# Patient Record
Sex: Female | Born: 1975 | Race: Black or African American | Hispanic: No | Marital: Single | Smoking: Former smoker
Health system: Southern US, Community
[De-identification: ages and names within clinical notes are randomized; demographics above are authoritative.]

## PROBLEM LIST (undated history)

## (undated) ENCOUNTER — Ambulatory Visit: Admission: EM | Payer: Medicaid Other

## (undated) DIAGNOSIS — N63 Unspecified lump in unspecified breast: Secondary | ICD-10-CM

## (undated) DIAGNOSIS — B001 Herpesviral vesicular dermatitis: Secondary | ICD-10-CM

## (undated) DIAGNOSIS — K219 Gastro-esophageal reflux disease without esophagitis: Secondary | ICD-10-CM

## (undated) DIAGNOSIS — I480 Paroxysmal atrial fibrillation: Secondary | ICD-10-CM

## (undated) DIAGNOSIS — I499 Cardiac arrhythmia, unspecified: Secondary | ICD-10-CM

## (undated) DIAGNOSIS — R002 Palpitations: Secondary | ICD-10-CM

## (undated) DIAGNOSIS — M5136 Other intervertebral disc degeneration, lumbar region: Secondary | ICD-10-CM

## (undated) DIAGNOSIS — M51369 Other intervertebral disc degeneration, lumbar region without mention of lumbar back pain or lower extremity pain: Secondary | ICD-10-CM

## (undated) DIAGNOSIS — F419 Anxiety disorder, unspecified: Secondary | ICD-10-CM

## (undated) HISTORY — DX: Gastro-esophageal reflux disease without esophagitis: K21.9

## (undated) HISTORY — DX: Other intervertebral disc degeneration, lumbar region without mention of lumbar back pain or lower extremity pain: M51.369

## (undated) HISTORY — DX: Herpesviral vesicular dermatitis: B00.1

## (undated) HISTORY — DX: Paroxysmal atrial fibrillation: I48.0

## (undated) HISTORY — PX: TUBAL LIGATION: SHX77

## (undated) HISTORY — DX: Unspecified lump in unspecified breast: N63.0

## (undated) HISTORY — DX: Other intervertebral disc degeneration, lumbar region: M51.36

## (undated) HISTORY — DX: Palpitations: R00.2

---

## 2000-01-02 ENCOUNTER — Emergency Department (HOSPITAL_COMMUNITY): Admission: EM | Admit: 2000-01-02 | Discharge: 2000-01-02 | Payer: Self-pay | Admitting: Emergency Medicine

## 2000-02-29 ENCOUNTER — Other Ambulatory Visit: Admission: RE | Admit: 2000-02-29 | Discharge: 2000-02-29 | Payer: Self-pay | Admitting: Obstetrics

## 2000-08-06 ENCOUNTER — Inpatient Hospital Stay (HOSPITAL_COMMUNITY): Admission: AD | Admit: 2000-08-06 | Discharge: 2000-08-06 | Payer: Self-pay | Admitting: Obstetrics

## 2000-08-08 ENCOUNTER — Inpatient Hospital Stay (HOSPITAL_COMMUNITY): Admission: AD | Admit: 2000-08-08 | Discharge: 2000-08-10 | Payer: Self-pay

## 2001-10-10 ENCOUNTER — Emergency Department (HOSPITAL_COMMUNITY): Admission: EM | Admit: 2001-10-10 | Discharge: 2001-10-10 | Payer: Self-pay | Admitting: Emergency Medicine

## 2002-07-24 ENCOUNTER — Emergency Department (HOSPITAL_COMMUNITY): Admission: RE | Admit: 2002-07-24 | Discharge: 2002-07-25 | Payer: Self-pay | Admitting: *Deleted

## 2002-07-24 ENCOUNTER — Encounter: Payer: Self-pay | Admitting: *Deleted

## 2002-12-28 ENCOUNTER — Inpatient Hospital Stay (HOSPITAL_COMMUNITY): Admission: AD | Admit: 2002-12-28 | Discharge: 2002-12-28 | Payer: Self-pay | Admitting: Obstetrics

## 2005-09-20 ENCOUNTER — Ambulatory Visit: Payer: Self-pay | Admitting: Family Medicine

## 2005-10-25 ENCOUNTER — Ambulatory Visit: Payer: Self-pay | Admitting: Family Medicine

## 2005-10-30 ENCOUNTER — Ambulatory Visit: Payer: Self-pay | Admitting: *Deleted

## 2005-12-15 ENCOUNTER — Ambulatory Visit: Payer: Self-pay | Admitting: Family Medicine

## 2006-04-16 ENCOUNTER — Ambulatory Visit (HOSPITAL_COMMUNITY): Admission: RE | Admit: 2006-04-16 | Discharge: 2006-04-16 | Payer: Self-pay | Admitting: Family Medicine

## 2006-04-16 ENCOUNTER — Ambulatory Visit: Payer: Self-pay | Admitting: Family Medicine

## 2006-09-14 ENCOUNTER — Ambulatory Visit: Payer: Self-pay | Admitting: Family Medicine

## 2007-03-05 ENCOUNTER — Inpatient Hospital Stay (HOSPITAL_COMMUNITY): Admission: AD | Admit: 2007-03-05 | Discharge: 2007-03-05 | Payer: Self-pay | Admitting: Obstetrics & Gynecology

## 2007-03-28 ENCOUNTER — Ambulatory Visit (HOSPITAL_COMMUNITY): Admission: RE | Admit: 2007-03-28 | Discharge: 2007-03-28 | Payer: Self-pay | Admitting: Obstetrics

## 2007-04-25 ENCOUNTER — Ambulatory Visit (HOSPITAL_COMMUNITY): Admission: RE | Admit: 2007-04-25 | Discharge: 2007-04-25 | Payer: Self-pay | Admitting: Obstetrics

## 2007-05-24 ENCOUNTER — Ambulatory Visit (HOSPITAL_COMMUNITY): Admission: RE | Admit: 2007-05-24 | Discharge: 2007-05-24 | Payer: Self-pay | Admitting: Obstetrics

## 2007-06-21 ENCOUNTER — Ambulatory Visit (HOSPITAL_COMMUNITY): Admission: RE | Admit: 2007-06-21 | Discharge: 2007-06-21 | Payer: Self-pay | Admitting: Obstetrics

## 2007-07-12 ENCOUNTER — Ambulatory Visit (HOSPITAL_COMMUNITY): Admission: RE | Admit: 2007-07-12 | Discharge: 2007-07-12 | Payer: Self-pay | Admitting: Obstetrics

## 2007-07-16 ENCOUNTER — Ambulatory Visit: Payer: Self-pay | Admitting: Obstetrics & Gynecology

## 2007-07-19 ENCOUNTER — Ambulatory Visit: Payer: Self-pay | Admitting: Obstetrics & Gynecology

## 2007-07-22 ENCOUNTER — Ambulatory Visit (HOSPITAL_COMMUNITY): Admission: RE | Admit: 2007-07-22 | Discharge: 2007-07-22 | Payer: Self-pay | Admitting: Obstetrics

## 2007-07-29 ENCOUNTER — Ambulatory Visit (HOSPITAL_COMMUNITY): Admission: RE | Admit: 2007-07-29 | Discharge: 2007-07-29 | Payer: Self-pay | Admitting: Obstetrics

## 2007-08-01 ENCOUNTER — Ambulatory Visit (HOSPITAL_COMMUNITY): Admission: RE | Admit: 2007-08-01 | Discharge: 2007-08-01 | Payer: Self-pay | Admitting: Obstetrics

## 2007-08-05 ENCOUNTER — Ambulatory Visit (HOSPITAL_COMMUNITY): Admission: RE | Admit: 2007-08-05 | Discharge: 2007-08-05 | Payer: Self-pay | Admitting: Obstetrics

## 2007-08-08 ENCOUNTER — Ambulatory Visit (HOSPITAL_COMMUNITY): Admission: RE | Admit: 2007-08-08 | Discharge: 2007-08-08 | Payer: Self-pay | Admitting: Obstetrics

## 2007-08-15 ENCOUNTER — Encounter (INDEPENDENT_AMBULATORY_CARE_PROVIDER_SITE_OTHER): Payer: Self-pay | Admitting: Obstetrics

## 2007-08-15 ENCOUNTER — Inpatient Hospital Stay (HOSPITAL_COMMUNITY): Admission: RE | Admit: 2007-08-15 | Discharge: 2007-08-18 | Payer: Self-pay | Admitting: Obstetrics

## 2007-08-28 ENCOUNTER — Encounter (INDEPENDENT_AMBULATORY_CARE_PROVIDER_SITE_OTHER): Payer: Self-pay | Admitting: *Deleted

## 2008-02-03 ENCOUNTER — Emergency Department (HOSPITAL_COMMUNITY): Admission: EM | Admit: 2008-02-03 | Discharge: 2008-02-03 | Payer: Self-pay | Admitting: Family Medicine

## 2008-02-16 ENCOUNTER — Emergency Department (HOSPITAL_COMMUNITY): Admission: EM | Admit: 2008-02-16 | Discharge: 2008-02-16 | Payer: Self-pay | Admitting: Family Medicine

## 2008-05-02 ENCOUNTER — Emergency Department (HOSPITAL_COMMUNITY): Admission: EM | Admit: 2008-05-02 | Discharge: 2008-05-02 | Payer: Self-pay | Admitting: Family Medicine

## 2008-05-04 ENCOUNTER — Emergency Department (HOSPITAL_COMMUNITY): Admission: EM | Admit: 2008-05-04 | Discharge: 2008-05-04 | Payer: Self-pay | Admitting: Emergency Medicine

## 2008-05-07 ENCOUNTER — Emergency Department (HOSPITAL_COMMUNITY): Admission: EM | Admit: 2008-05-07 | Discharge: 2008-05-07 | Payer: Self-pay | Admitting: Emergency Medicine

## 2009-01-07 ENCOUNTER — Encounter: Payer: Self-pay | Admitting: Internal Medicine

## 2009-01-07 ENCOUNTER — Ambulatory Visit: Payer: Self-pay | Admitting: Internal Medicine

## 2009-01-07 LAB — CONVERTED CEMR LAB
Albumin: 4.7 g/dL (ref 3.5–5.2)
BUN: 16 mg/dL (ref 6–23)
CO2: 18 meq/L — ABNORMAL LOW (ref 19–32)
Eosinophils Relative: 1 % (ref 0–5)
Glucose, Bld: 83 mg/dL (ref 70–99)
HCT: 38 % (ref 36.0–46.0)
Lymphocytes Relative: 30 % (ref 12–46)
Lymphs Abs: 3 10*3/uL (ref 0.7–4.0)
Monocytes Relative: 6 % (ref 3–12)
Platelets: 412 10*3/uL — ABNORMAL HIGH (ref 150–400)
Potassium: 4.5 meq/L (ref 3.5–5.3)
RBC: 4.28 M/uL (ref 3.87–5.11)
Sodium: 139 meq/L (ref 135–145)
Total Bilirubin: 0.3 mg/dL (ref 0.3–1.2)
Total Protein: 7.6 g/dL (ref 6.0–8.3)
WBC: 9.8 10*3/uL (ref 4.0–10.5)

## 2009-03-31 ENCOUNTER — Ambulatory Visit: Payer: Self-pay | Admitting: Internal Medicine

## 2009-04-02 ENCOUNTER — Ambulatory Visit: Payer: Self-pay | Admitting: Internal Medicine

## 2009-04-12 ENCOUNTER — Ambulatory Visit: Payer: Self-pay | Admitting: Family Medicine

## 2009-06-01 ENCOUNTER — Encounter (INDEPENDENT_AMBULATORY_CARE_PROVIDER_SITE_OTHER): Payer: Self-pay | Admitting: Family Medicine

## 2009-06-01 ENCOUNTER — Other Ambulatory Visit: Admission: RE | Admit: 2009-06-01 | Discharge: 2009-06-01 | Payer: Self-pay | Admitting: Family Medicine

## 2009-06-01 ENCOUNTER — Ambulatory Visit: Payer: Self-pay | Admitting: Family Medicine

## 2009-06-01 LAB — CONVERTED CEMR LAB
Chlamydia, DNA Probe: NEGATIVE
GC Probe Amp, Genital: NEGATIVE
LDL Cholesterol: 112 mg/dL — ABNORMAL HIGH (ref 0–99)

## 2009-08-19 ENCOUNTER — Ambulatory Visit: Payer: Self-pay | Admitting: Internal Medicine

## 2009-10-06 ENCOUNTER — Telehealth (INDEPENDENT_AMBULATORY_CARE_PROVIDER_SITE_OTHER): Payer: Self-pay | Admitting: *Deleted

## 2009-10-06 ENCOUNTER — Ambulatory Visit: Payer: Self-pay | Admitting: Family Medicine

## 2009-12-14 ENCOUNTER — Ambulatory Visit: Payer: Self-pay | Admitting: Family Medicine

## 2009-12-14 LAB — CONVERTED CEMR LAB
Albumin: 4.3 g/dL (ref 3.5–5.2)
Alkaline Phosphatase: 59 units/L (ref 39–117)
CO2: 24 meq/L (ref 19–32)
Chloride: 102 meq/L (ref 96–112)
Eosinophils Relative: 0 % (ref 0–5)
Glucose, Bld: 87 mg/dL (ref 70–99)
HCT: 37.3 % (ref 36.0–46.0)
Hemoglobin: 12.3 g/dL (ref 12.0–15.0)
Lymphocytes Relative: 15 % (ref 12–46)
Lymphs Abs: 1.1 10*3/uL (ref 0.7–4.0)
Monocytes Absolute: 0.6 10*3/uL (ref 0.1–1.0)
Monocytes Relative: 8 % (ref 3–12)
Potassium: 4.2 meq/L (ref 3.5–5.3)
Sodium: 139 meq/L (ref 135–145)
Total Protein: 7.2 g/dL (ref 6.0–8.3)
WBC: 7.4 10*3/uL (ref 4.0–10.5)

## 2010-03-14 ENCOUNTER — Ambulatory Visit: Payer: Self-pay | Admitting: Family Medicine

## 2010-06-15 ENCOUNTER — Ambulatory Visit: Payer: Self-pay | Admitting: Family Medicine

## 2010-08-09 ENCOUNTER — Ambulatory Visit: Payer: Self-pay | Admitting: Internal Medicine

## 2010-08-12 ENCOUNTER — Ambulatory Visit: Payer: Self-pay | Admitting: Family Medicine

## 2010-09-01 ENCOUNTER — Emergency Department (HOSPITAL_COMMUNITY): Admission: EM | Admit: 2010-09-01 | Discharge: 2010-09-01 | Payer: Self-pay | Admitting: Emergency Medicine

## 2010-09-19 ENCOUNTER — Emergency Department (HOSPITAL_COMMUNITY): Admission: EM | Admit: 2010-09-19 | Discharge: 2010-09-20 | Payer: Self-pay | Admitting: Emergency Medicine

## 2011-01-01 ENCOUNTER — Encounter: Payer: Self-pay | Admitting: Obstetrics

## 2011-02-23 LAB — URINALYSIS, ROUTINE W REFLEX MICROSCOPIC
Bilirubin Urine: NEGATIVE
Nitrite: NEGATIVE
Specific Gravity, Urine: 1.035 — ABNORMAL HIGH (ref 1.005–1.030)
Urobilinogen, UA: 0.2 mg/dL (ref 0.0–1.0)
pH: 6 (ref 5.0–8.0)

## 2011-02-23 LAB — POCT CARDIAC MARKERS
CKMB, poc: <1 ng/mL — ABNORMAL LOW (ref 1.0–8.0)
Myoglobin, poc: 63.1 ng/mL (ref 12–200)

## 2011-02-23 LAB — D-DIMER, QUANTITATIVE: D-Dimer, Quant: 1.27 ug/mL-FEU — ABNORMAL HIGH (ref 0.00–0.48)

## 2011-04-16 ENCOUNTER — Inpatient Hospital Stay (HOSPITAL_COMMUNITY): Payer: Self-pay

## 2011-04-16 ENCOUNTER — Inpatient Hospital Stay (HOSPITAL_COMMUNITY)
Admission: AD | Admit: 2011-04-16 | Discharge: 2011-04-16 | Disposition: A | Discharge status: Home / Self Care | Payer: Self-pay | Source: Ambulatory Visit | Attending: Obstetrics & Gynecology | Admitting: Obstetrics & Gynecology

## 2011-04-16 DIAGNOSIS — N921 Excessive and frequent menstruation with irregular cycle: Secondary | ICD-10-CM

## 2011-04-16 DIAGNOSIS — N6009 Solitary cyst of unspecified breast: Secondary | ICD-10-CM | POA: Insufficient documentation

## 2011-04-16 LAB — URINALYSIS, ROUTINE W REFLEX MICROSCOPIC
Bilirubin Urine: NEGATIVE
Glucose, UA: NEGATIVE mg/dL
Ketones, ur: NEGATIVE mg/dL
Nitrite: NEGATIVE
Specific Gravity, Urine: 1.025 (ref 1.005–1.030)
pH: 5.5 (ref 5.0–8.0)

## 2011-04-16 LAB — WET PREP, GENITAL
Trich, Wet Prep: NONE SEEN
Yeast Wet Prep HPF POC: NONE SEEN

## 2011-04-16 LAB — URINE MICROSCOPIC-ADD ON

## 2011-04-16 LAB — POCT PREGNANCY, URINE: Preg Test, Ur: NEGATIVE

## 2011-04-17 LAB — GC/CHLAMYDIA PROBE AMP, GENITAL: GC Probe Amp, Genital: NEGATIVE

## 2011-04-25 NOTE — Op Note (Signed)
Melanie Vincent, Melanie Vincent              ACCOUNT NO.:  [id number]   MEDICAL RECORD NO.:  [id number]          PATIENT TYPE:  INP   LOCATION:                                FACILITY:  [facility]   PHYSICIAN:  Kathreen Cosier, M.D.DATE OF BIRTH:  [birth year]   DATE OF PROCEDURE:  08/15/2007  DATE OF DISCHARGE:                               OPERATIVE REPORT   PREOPERATIVE DIAGNOSIS:  Intrauterine pregnancy twin gestation at term,  multiparity, desired C-section and tubal ligation.   SURGEON:  Kathreen Cosier, M.D.   FIRST ASSISTANT:  Charles A. Clearance Coots, M.D.   ANESTHESIA:  Spinal.   PROCEDURE:  The patient placed on the operating table in supine position  after spinal administered.  Abdomen prepped and draped, bladder emptied  with Foley catheter.  A transverse suprapubic incision made carried down  to the rectus fascia.  Fascia cleaned and incised length of incision.  Recti muscles retracted laterally.  Peritoneum incised longitudinally.  Transverse incision made in the visceral peritoneum above the bladder.  Bladder mobilized inferiorly.  Transverse lower uterine incision made.  Twin A was a vertex, fluid was clear and it was a female weighing 6  pounds 4 ounces, Apgar 8 and 8.  The team was in attendance.  Twin B  vertex female, 6 pounds 14 ounces, Apgar 9 and 9, two placentas removed  manually and sent to pathology.  Uterine cavity cleaned with dry laps.  Uterine incision closed in one layer with continuous suture of #1  chromic.  Bladder flap reattached with 2-0 chromic, left tube grasped in  midportion with Babcock clamp.  Zero plain suture placed in the  mesosalpinx below the portion of tube within clamp.  This was tied.  Approximately 1 inch of tube transected.  Hemostasis satisfactory.  Procedure done in similar fashion other side.  Lap and sponge counts  correct.  Abdomen closed in layers, peritoneum continuous suture of 0  chromic, fascia continuous suture of 0 Dexon, skin closed  with  subcuticular stitch of 4-0 Monocryl.  Blood loss 800 mL.  The patient  tolerated procedure well, taken to recovery room in good condition.           ______________________________  Kathreen Cosier, M.D.     BAM/MEDQ  D:  08/15/2007  T:  08/15/2007  Job:  [id number]

## 2011-04-28 NOTE — Discharge Summary (Signed)
Melanie Vincent, Melanie Vincent              ACCOUNT NO.:  [id number]   MEDICAL RECORD NO.:  [id number]          PATIENT TYPE:  INP   LOCATION:  9127                          [facility]ACILITY:  [facility]   PHYSICIAN:  Kathreen Cosier, M.D.DATE OF BIRTH:  1976-03-12   DATE OF ADMISSION:  08/15/2007  DATE OF DISCHARGE:  08/18/2007                               DISCHARGE SUMMARY   The patient is a 35 year old, gravida 4, para 3-0-0-3 whose EDC was  September 04, 2007.  She was pregnant with twins and desired C-section  and tubal ligation. She had a low transverse cesarean section. Twin A  was vertex, 6 pounds 4 ounces, Apgar 8 and 9, fluid clear.  Twin B  vertex, 6 pounds 14 ounces, Apgar 9 and 9, female. The placenta was sent  to pathology. She had a tubal ligation performed. On admission her  hemoglobin was 11.8, postop hematocrit 26, platelets 211 and 194.  She  did well postop and was discharged on the third postoperative day  ambulatory on a regular diet on Tylox for pain and ferrous sulfate for  anemia.   DISCHARGE DIAGNOSES:  1. Status post primary low transverse cesarean section.  2. Tubal ligation.  3. Twin gestation at term.           ______________________________  Kathreen Cosier, M.D.     BAM/MEDQ  D:  09/11/2007  T:  09/11/2007  Job:  [id number]

## 2011-05-04 ENCOUNTER — Ambulatory Visit (INDEPENDENT_AMBULATORY_CARE_PROVIDER_SITE_OTHER): Payer: Self-pay | Admitting: Family Medicine

## 2011-05-04 ENCOUNTER — Other Ambulatory Visit: Payer: Self-pay | Admitting: Family Medicine

## 2011-05-04 DIAGNOSIS — N63 Unspecified lump in unspecified breast: Secondary | ICD-10-CM

## 2011-05-04 DIAGNOSIS — N6310 Unspecified lump in the right breast, unspecified quadrant: Secondary | ICD-10-CM

## 2011-05-05 NOTE — Group Therapy Note (Signed)
Melanie Vincent, Melanie Vincent              ACCOUNT NO.:  [id number]  MEDICAL RECORD NO.:  [id number]           PATIENT TYPE:  A  LOCATION:  [facility]                   FACILITY:  [facility]  PHYSICIAN:  Tinnie Gens, MD        DATE OF BIRTH:  [birth year]  DATE OF SERVICE:  05/04/2011                                 CLINIC NOTE  CHIEF COMPLAINT:  MAU referral for menometrorrhagia and breast mass.  HISTORY OF PRESENT ILLNESS:  The patient is a 35 year old gravida 7, para 5 who is 5-0-2-5 who is status post tubal ligation.  She has had cycles every 2-3 weeks for the last 3 cycles, previously her cycles have been normal.  She came to the MAU on Apr 16, 2011, with complaint of breast pain.  She was found to have a breast mass at approximately 10 o'clock position.  She was supposed to be referred to the breast center; however, she came here instead.  She had a pelvic sonogram on the day she came in which showed a normal uterus with endometrial stripe 4.5 cm and normal right and left ovary.  The patient thought she will come here today for breast eval workup.  She reports her breasts are no longer tender.  She does have a history of a mammogram in 2006 and she was told she has fibrocystic breast tissue.  She reports weight gain as well.  PAST MEDICAL HISTORY:  Negative.  PAST SURGICAL HISTORY:  She had a C-section x1.  MEDICATIONS:  None.  ALLERGIES:  None known.  OBSTETRICAL HISTORY:  She is a G7, P5 with one C section and 4 vaginal deliveries and 2 terminations.  GYN HISTORY:  She is status post tubal ligation.  No history of abnormal Pap.  She has regular cycles.  FAMILY HISTORY:  Diabetes in her father.  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  She smokes marijuana.  She does not smoke cigarettes.  She reports some anxiety, headache, and palpitations. Otherwise review of systems is negative, see GYN history in the chart.  PHYSICAL  EXAMINATION:  VITAL SIGNS:  Vitals are as noted in the chart. Blood pressure is 119/79. BREASTS:  On the breast at the 10 o'clock position, there is an elongated firm mass noted approximately 1.5 x 1 cm.  It is somewhat tender.  It is freely mobile.  The other breast is without mass or tenderness.  There is some fibrocystic change under this mass.  IMPRESSION: 1. Breast mass, unclear etiology. 2. Menorrhagia.  PLAN: 1. Check TSH today. 2. Referral to breast center.  Follow up p.r.n. or in 4-6 weeks.          ______________________________ Tinnie Gens, MD    TP/MEDQ  D:  05/04/2011  T:  05/05/2011  Job:  [id number]

## 2011-05-11 ENCOUNTER — Ambulatory Visit
Admission: RE | Admit: 2011-05-11 | Discharge: 2011-05-11 | Disposition: A | Discharge status: Home / Self Care | Payer: Self-pay | Source: Ambulatory Visit | Attending: Family Medicine | Admitting: Family Medicine

## 2011-05-11 DIAGNOSIS — N6310 Unspecified lump in the right breast, unspecified quadrant: Secondary | ICD-10-CM

## 2011-06-15 ENCOUNTER — Ambulatory Visit: Payer: Self-pay | Admitting: Obstetrics and Gynecology

## 2011-06-17 ENCOUNTER — Emergency Department (HOSPITAL_COMMUNITY)
Admission: EM | Admit: 2011-06-17 | Discharge: 2011-06-17 | Disposition: A | Discharge status: Home / Self Care | Payer: Self-pay | Attending: Emergency Medicine | Admitting: Emergency Medicine

## 2011-06-17 ENCOUNTER — Emergency Department (HOSPITAL_COMMUNITY): Payer: Self-pay

## 2011-06-17 DIAGNOSIS — R071 Chest pain on breathing: Secondary | ICD-10-CM | POA: Insufficient documentation

## 2011-08-29 ENCOUNTER — Inpatient Hospital Stay (INDEPENDENT_AMBULATORY_CARE_PROVIDER_SITE_OTHER)
Admission: RE | Admit: 2011-08-29 | Discharge: 2011-08-29 | Disposition: A | Discharge status: Home / Self Care | Payer: Self-pay | Source: Ambulatory Visit | Attending: Family Medicine | Admitting: Family Medicine

## 2011-08-29 DIAGNOSIS — R071 Chest pain on breathing: Secondary | ICD-10-CM

## 2011-09-04 LAB — POCT URINALYSIS DIP (DEVICE)
Bilirubin Urine: NEGATIVE
Glucose, UA: NEGATIVE
Hgb urine dipstick: NEGATIVE
Ketones, ur: NEGATIVE
Nitrite: NEGATIVE
pH: 7

## 2011-09-04 LAB — GC/CHLAMYDIA PROBE AMP, GENITAL: GC Probe Amp, Genital: NEGATIVE

## 2011-09-04 LAB — WET PREP, GENITAL
Trich, Wet Prep: NONE SEEN
Yeast Wet Prep HPF POC: NONE SEEN

## 2011-09-04 LAB — POCT PREGNANCY, URINE: Preg Test, Ur: NEGATIVE

## 2011-09-06 LAB — POCT RAPID STREP A: Streptococcus, Group A Screen (Direct): POSITIVE — AB

## 2011-09-22 LAB — CBC
HCT: 26 — ABNORMAL LOW
Hemoglobin: 11.8 — ABNORMAL LOW
MCHC: 34.5
MCHC: 35
Platelets: 211
RBC: 2.83 — ABNORMAL LOW
RDW: 13.8
RDW: 13.9
WBC: 9.6

## 2011-11-28 ENCOUNTER — Other Ambulatory Visit: Payer: Self-pay | Admitting: Family Medicine

## 2011-12-03 ENCOUNTER — Emergency Department (HOSPITAL_COMMUNITY)
Admission: EM | Admit: 2011-12-03 | Discharge: 2011-12-03 | Disposition: A | Discharge status: Home / Self Care | Payer: Self-pay | Source: Home / Self Care | Attending: Family Medicine | Admitting: Family Medicine

## 2011-12-03 ENCOUNTER — Encounter (HOSPITAL_COMMUNITY): Payer: Self-pay | Admitting: *Deleted

## 2011-12-03 ENCOUNTER — Emergency Department (INDEPENDENT_AMBULATORY_CARE_PROVIDER_SITE_OTHER): Payer: Self-pay

## 2011-12-03 DIAGNOSIS — J069 Acute upper respiratory infection, unspecified: Secondary | ICD-10-CM

## 2011-12-03 MED ORDER — AZITHROMYCIN 250 MG PO TABS: ORAL_TABLET | ORAL | Status: AC

## 2011-12-03 MED ORDER — IPRATROPIUM BROMIDE 0.06 % NA SOLN: 2.0000 | Freq: Four times a day (QID) | NASAL | Status: DC

## 2011-12-03 MED ORDER — PSEUDOEPHEDRINE-GUAIFENESIN ER 120-1200 MG PO TB12: 120.0000 mg | ORAL_TABLET | Freq: Two times a day (BID) | ORAL | Status: DC

## 2011-12-03 NOTE — ED Provider Notes (Signed)
History     CSN: [id number]  Arrival date & time 12/03/11  7829   First MD Initiated Contact with Patient 12/03/11 1034      Chief Complaint  Patient presents with  . Facial Pain  . Nasal Congestion    (Consider location/radiation/quality/duration/timing/severity/associated sxs/prior treatment) Patient is a 35 y.o. female presenting with URI. The history is provided by the patient.  URI The primary symptoms include fever, headaches and sore throat. Primary symptoms do not include ear pain, cough, nausea or vomiting. The current episode started 3 to 5 days ago. This is a new problem.  Symptoms associated with the illness include facial pain, sinus pressure, congestion and rhinorrhea.    History reviewed. No pertinent past medical history.  Past Surgical History  Procedure Date  . Cesarean section   . Tubal ligation     No family history on file.  History  Substance Use Topics  . Smoking status: Former Games developer  . Smokeless tobacco: Not on file  . Alcohol Use:      Occasional use    OB History    Grav Para Term Preterm Abortions TAB SAB Ect Mult Living                  Review of Systems  Constitutional: Positive for fever.  HENT: Positive for nosebleeds, congestion, sore throat, rhinorrhea, postnasal drip and sinus pressure. Negative for ear pain and ear discharge.   Eyes: Negative.   Respiratory: Negative for cough.   Gastrointestinal: Negative.  Negative for nausea and vomiting.  Neurological: Positive for headaches.    Allergies  Review of patient's allergies indicates no known allergies.  Home Medications   Current Outpatient Rx  Name Route Sig Dispense Refill  . AZITHROMYCIN 250 MG PO TABS  Take as directed on pack 6 each 0  . IPRATROPIUM BROMIDE 0.06 % NA SOLN Nasal Place 2 sprays into the nose 4 (four) times daily. 15 mL 12  . PSEUDOEPHEDRINE-GUAIFENESIN [id number] MG PO TB12 Oral Take 120-1,200 mg by mouth 2 (two) times daily. 30 each 0    BP  117/79  Pulse 89  Temp(Src) 99 F (37.2 C) (Oral)  Resp 17  SpO2 100%  LMP 11/19/2011  Physical Exam  Nursing note and vitals reviewed. Constitutional: She is oriented to person, place, and time. She appears well-developed and well-nourished.  HENT:  Head: Normocephalic.  Right Ear: External ear normal.  Left Ear: External ear normal.  Nose: Mucosal edema, rhinorrhea and sinus tenderness present. Epistaxis is observed.  Mouth/Throat: Oropharynx is clear and moist.  Eyes: Conjunctivae are normal. Pupils are equal, round, and reactive to light.  Neck: Normal range of motion. Neck supple.  Cardiovascular: Normal rate, normal heart sounds and intact distal pulses.   Pulmonary/Chest: Effort normal and breath sounds normal.  Lymphadenopathy:    She has no cervical adenopathy.  Neurological: She is alert and oriented to person, place, and time.  Skin: Skin is warm and dry.    ED Course  Procedures (including critical care time)  Labs Reviewed - No data to display Dg Sinuses Complete  12/03/2011  *RADIOLOGY REPORT*  Clinical Data: Sinus pressure  PARANASAL SINUSES - COMPLETE 3 + VIEW  Comparison: None.  Findings: There is no fluid within the paranasal sinuses by plain film radiography.  IMPRESSION: No radiographic evidence of sinusitis.  Original Report Authenticated By: Genevive Bi, M.D.     1. URI (upper respiratory infection)       MDM  X-rays  reviewed and report per radiologist.         Barkley Bruns, MD 12/03/11 1153

## 2011-12-03 NOTE — ED Notes (Signed)
C/O chills.  Reports "blood clot" coming out of sinuses this morning.  C/O facial pain; "feels like I've got a sinus infection".  Also c/o runny nose, slight sore throat.  Fever this AM was 100.5.  Has been taking Motrin - last dose @ 0500.

## 2012-02-09 ENCOUNTER — Encounter: Payer: Self-pay | Admitting: Family

## 2012-03-18 ENCOUNTER — Emergency Department (INDEPENDENT_AMBULATORY_CARE_PROVIDER_SITE_OTHER)
Admission: EM | Admit: 2012-03-18 | Discharge: 2012-03-18 | Disposition: A | Discharge status: Home / Self Care | Payer: Self-pay | Source: Home / Self Care

## 2012-03-18 ENCOUNTER — Encounter (HOSPITAL_COMMUNITY): Payer: Self-pay

## 2012-03-18 DIAGNOSIS — B9789 Other viral agents as the cause of diseases classified elsewhere: Secondary | ICD-10-CM

## 2012-03-18 DIAGNOSIS — J988 Other specified respiratory disorders: Secondary | ICD-10-CM

## 2012-03-18 MED ORDER — GUAIFENESIN-CODEINE 100-10 MG/5ML PO SYRP: ORAL_SOLUTION | ORAL | Status: AC

## 2012-03-18 NOTE — ED Provider Notes (Signed)
History     CSN: [id number]  Arrival date & time 03/18/12  1208   None     Chief Complaint  Patient presents with  . Influenza    (Consider location/radiation/quality/duration/timing/severity/associated sxs/prior treatment) HPI Comments: Patient presents with onset of fever, sore throat, cough and body aches 2 days ago. Fever was 102 for 2 days, but has been down to 99 today. She continues with chills and body aches today. Cough is productive with yellowish sputum. She also has mild nasal congestion. She states she has tried NyQuil for her cough but this causes bad dreams. She has been taking ibuprofen for her fever. 2 of her children are home sick with the same symptoms. They were seen by their pediatrician today and diagnosed with URI. 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   History reviewed. No pertinent past medical history.  Past Surgical History  Procedure Date  . Cesarean section   . Tubal ligation     History reviewed. No pertinent family history.  History  Substance Use Topics  . Smoking status: Former Games developer  . Smokeless tobacco: Not on file  . Alcohol Use:      Occasional use    OB History    Grav Para Term Preterm Abortions TAB SAB Ect Mult Living                  Review of Systems  Constitutional: Positive for fever, chills, appetite change and fatigue.  HENT: Positive for congestion and sore throat. Negative for ear pain, rhinorrhea, sneezing, postnasal drip and sinus pressure.   Respiratory: Positive for cough. Negative for shortness of breath and wheezing.   Cardiovascular: Negative for chest pain.  Musculoskeletal: Positive for myalgias.    Allergies  Review of patient's allergies indicates no known allergies.  Home Medications   Current Outpatient Rx  Name Route Sig Dispense Refill  . GUAIFENESIN-CODEINE 100-10 MG/5ML PO SYRP  1-2 tsp every 6 hrs prn cough 120 mL 0    BP 125/80  Pulse 102  Temp(Src) 99.1  F (37.3 C) (Oral)  Resp 18  SpO2 100%  LMP 03/04/2012  Physical Exam  Nursing note and vitals reviewed. Constitutional: She appears well-developed and well-nourished. No distress.  HENT:  Head: Normocephalic and atraumatic.  Right Ear: Tympanic membrane, external ear and ear canal normal.  Left Ear: Tympanic membrane, external ear and ear canal normal.  Nose: Nose normal.  Mouth/Throat: Uvula is midline, oropharynx is clear and moist and mucous membranes are normal. No oropharyngeal exudate, posterior oropharyngeal edema or posterior oropharyngeal erythema.  Neck: Neck supple.  Cardiovascular: Normal rate, regular rhythm and normal heart sounds.   Pulmonary/Chest: Effort normal and breath sounds normal. No respiratory distress.  Lymphadenopathy:    She has no cervical adenopathy.  Neurological: She is alert.  Skin: Skin is warm and dry.  Psychiatric: She has a normal mood and affect.    ED Course  Procedures (including critical care time)  Labs Reviewed - No data to display No results found.   1. Viral respiratory illness       MDM  Exam neg. Supportive care and prescription cough medication.        Melody Comas, [place] 03/18/12 [phone]

## 2012-03-18 NOTE — ED Notes (Signed)
C/o cough, congestion, no appetite, body aches since Friday; NAD, using OTC medications w/o relief

## 2012-03-18 NOTE — Discharge Instructions (Signed)
Tylenol or Ibuprofen as needed for fever and discomfort. Increase fluids.  Do not drive or operate machinery while taking Hydrocodone. Return if symptoms change or worsen.

## 2012-03-18 NOTE — ED Provider Notes (Signed)
Medical screening examination/treatment/procedure(s) were performed by non-physician practitioner and as supervising physician I was immediately available for consultation/collaboration.  Raynald Blend, MD 03/18/12 1745

## 2012-03-29 ENCOUNTER — Emergency Department (INDEPENDENT_AMBULATORY_CARE_PROVIDER_SITE_OTHER)
Admission: EM | Admit: 2012-03-29 | Discharge: 2012-03-29 | Disposition: A | Discharge status: Home / Self Care | Payer: Self-pay | Source: Home / Self Care | Attending: Family Medicine | Admitting: Family Medicine

## 2012-03-29 ENCOUNTER — Encounter (HOSPITAL_COMMUNITY): Payer: Self-pay | Admitting: *Deleted

## 2012-03-29 DIAGNOSIS — R002 Palpitations: Secondary | ICD-10-CM

## 2012-03-29 NOTE — ED Notes (Signed)
Pt  Reports  Today  Earlier  Pt  Had   Symptoms  Of  Heart  Heart  Beating fast /  Fluttering    -  She  Reports       She has  Had  Similar in past  But  Went easily  Away   -  She  denys  Any  Chest  Pain or  Shortness of breath     -  She  Is  Awake   And  Alert and  Oriented             Skin is  Warm  /  Dry

## 2012-03-29 NOTE — ED Provider Notes (Signed)
History     CSN: [id number]  Arrival date & time 03/29/12  1358   First MD Initiated Contact with Patient 03/29/12 1412      Chief Complaint  Patient presents with  . Tachycardia    (Consider location/radiation/quality/duration/timing/severity/associated sxs/prior treatment) Patient is a 36 y.o. female presenting with palpitations. The history is provided by the patient.  Palpitations  This is a recurrent problem. The current episode started 3 to 5 hours ago. The problem has been gradually improving. The problem is associated with stress. Pertinent negatives include no chest pain, no irregular heartbeat, no near-syncope, no syncope, no nausea, no vomiting, no headaches and no dizziness. Risk factors include no known risk factors.    History reviewed. No pertinent past medical history.  Past Surgical History  Procedure Date  . Cesarean section   . Tubal ligation     History reviewed. No pertinent family history.  History  Substance Use Topics  . Smoking status: Former Games developer  . Smokeless tobacco: Not on file  . Alcohol Use:      Occasional use    OB History    Grav Para Term Preterm Abortions TAB SAB Ect Mult Living                  Review of Systems  Constitutional: Negative.   Respiratory: Negative.   Cardiovascular: Positive for palpitations. Negative for chest pain, syncope and near-syncope.  Gastrointestinal: Negative for nausea and vomiting.  Neurological: Negative for dizziness and headaches.    Allergies  Review of patient's allergies indicates no known allergies.  Home Medications   No current outpatient prescriptions on file.  BP 132/83  Pulse 77  Temp(Src) 99 F (37.2 C) (Oral)  Resp 18  SpO2 99%  LMP 03/04/2012  Physical Exam  Nursing note and vitals reviewed. Constitutional: She appears well-developed and well-nourished.  HENT:  Head: Normocephalic.  Eyes: Pupils are equal, round, and reactive to light.  Neck: Normal range of  motion. Neck supple. No thyromegaly present.  Cardiovascular: Normal rate, regular rhythm, normal heart sounds and intact distal pulses.   Pulmonary/Chest: Effort normal and breath sounds normal.  Lymphadenopathy:    She has no cervical adenopathy.  Neurological: She is alert.  Skin: Skin is warm and dry.  Psychiatric: She has a normal mood and affect.    ED Course  Procedures (including critical care time)  Labs Reviewed - No data to display No results found.   1. Palpitations       MDM  ecg---wnl except rare pvc        Linna Hoff, MD 04/03/12 2043

## 2012-06-04 ENCOUNTER — Encounter (HOSPITAL_COMMUNITY): Payer: Self-pay

## 2012-06-04 ENCOUNTER — Emergency Department (HOSPITAL_COMMUNITY)
Admission: EM | Admit: 2012-06-04 | Discharge: 2012-06-04 | Disposition: A | Discharge status: Home / Self Care | Payer: Self-pay | Source: Home / Self Care | Attending: Emergency Medicine | Admitting: Emergency Medicine

## 2012-06-04 DIAGNOSIS — I493 Ventricular premature depolarization: Secondary | ICD-10-CM

## 2012-06-04 DIAGNOSIS — R002 Palpitations: Secondary | ICD-10-CM

## 2012-06-04 DIAGNOSIS — I4949 Other premature depolarization: Secondary | ICD-10-CM

## 2012-06-04 LAB — POCT I-STAT, CHEM 8
BUN: 13 mg/dL (ref 6–23)
Calcium, Ion: 1.24 mmol/L (ref 1.12–1.32)
Chloride: 104 meq/L (ref 96–112)
Creatinine, Ser: 0.8 mg/dL (ref 0.50–1.10)
Glucose, Bld: 79 mg/dL (ref 70–99)
HCT: 39 % (ref 36.0–46.0)
Hemoglobin: 13.3 g/dL (ref 12.0–15.0)
Potassium: 4.1 meq/L (ref 3.5–5.1)
Sodium: 139 meq/L (ref 135–145)
TCO2: 23 mmol/L (ref 0–100)

## 2012-06-04 NOTE — ED Provider Notes (Signed)
Chief Complaint  Patient presents with  . Palpitations    History of Present Illness:   The patient is a 36 year old, otherwise healthy female who has a one-year history of intermittent palpitations. She was here 2 months ago reason was found to have rare PVCs. It was suggested that she followup with a cardiologist, but she was never never able to get this done. Over the past 5 days she's had more frequent palpitations. These just lasts a second or 2 and may occur every 5 minutes. It feels like her heart is beating forcefully or doing flip-flops. She denies any rapid heartbeat. She does have mild chest discomfort in the left pectoral area which comes and goes and last for seconds at a time. She denies any exertional chest pain, nausea, diaphoresis, dizziness, lightheadedness, or shortness of breath. She does feel a little bit stressed at times. She's not taking any medications and denies use of alcohol, tobacco, or caffeine. Nothing makes these PVCs better or worse.  Review of Systems:  Other than noted above, the patient denies any of the following symptoms. Systemic:  No fever, chills, or fatigue. Pulmonary:  No cough, wheezing, shortness of breath. Cardiac:  No chest pain, tightness, pressure, dizziness, presyncope, syncope, PND, orthopnea, or edema. Ext:  No leg pain or swelling. Neuro:  No weakness, paresthesias, or difficulty with speech or gait. Psych:  No anxiety or depression. Endo:  No weight loss, tremor, sweats, or heat intolerance.   PMFSH:  Past medical history, family history, social history, meds, and allergies were reviewed and updated as needed. No history of cardiac disease.  No history of excessive alcohol intake.  Physical Exam:   Vital signs:  BP 111/65  Pulse 82  Temp 97.9 F (36.6 C) (Oral)  Resp 18  SpO2 100%  LMP 05/28/2012 Gen:  Alert, oriented, in no distress, skin warm and dry. Eye:  PERRL, lids and conjunctivas normal.  No stare or lid lag. ENT:  Mucous  membranes moist, pharynx clear. Neck:  Supple, no adenopathy or tenderness.  No JVD.  Thyroid not enlarged. Lungs:  Clear to auscultation, no wheezes, rales or rhonchi.  No respiratory distress. Heart:  Regular rhythm, no extrasystoles.  No gallops, murmers, clicks or rubs. No extrasystoles were heard in 2 minutes of listening. Abdomen:  Soft, nontender, no organomegaly or mass.  Bowel sounds normal.  No pulsatile abdominal mass or bruit. Ext:  No edema. Pulses full and equal. Skin:  Warm and dry.  No rash.  Labs:   Results for orders placed during the hospital encounter of 06/04/12  POCT I-STAT, CHEM 8      Component Value Range   Sodium 139  135 - 145 mEq/L   Potassium 4.1  3.5 - 5.1 mEq/L   Chloride 104  96 - 112 mEq/L   BUN 13  6 - 23 mg/dL   Creatinine, Ser 9.81  0.50 - 1.10 mg/dL   Glucose, Bld 79  70 - 99 mg/dL   Calcium, Ion 1.91  4.78 - 1.32 mmol/L   TCO2 23  0 - 100 mmol/L   Hemoglobin 13.3  12.0 - 15.0 g/dL   HCT 29.5  62.1 - 30.8 %     EKG:   Date: 06/04/2012  Rate: 78  Rhythm: normal sinus rhythm  QRS Axis: normal  Intervals: normal  ST/T Wave abnormalities: normal  Conduction Disutrbances:none  Narrative Interpretation: Normal sinus rhythm, normal EKG.  Old EKG Reviewed: none available  Assessment:  The primary encounter  diagnosis was Palpitations. A diagnosis of PVC's (premature ventricular contractions) was also pertinent to this visit.  Plan:   1.  The following meds were prescribed:   New Prescriptions   No medications on file   2.  The patient was instructed in symptomatic care and handouts were given. 3.  The patient was told to return if becoming worse in any way, if no better in 3 or 4 days, and given some red flag symptoms including syncope, presyncope, dyspnea, or chest pain that would indicate earlier return.  Follow up:  The patient was told to follow up with Dr. Marca Ancona within the next week.     Reuben Likes, MD 06/04/12 [phone]

## 2012-06-04 NOTE — ED Notes (Signed)
C/o frequent palpitations on Friday, states she has continued to have them up until today but they are getting less frequent.  States she had "a little" chest pain on Friday or Saturday- shes not sure.  Denies pain or SOB.  Reports being seen here 2 months ago for same.

## 2012-06-04 NOTE — Discharge Instructions (Signed)
Premature Ventricular Contraction  Premature ventricular contraction (PVC) is an irregularity of the heart rhythm involving extra or skipped heartbeats. In some cases, they may occur without obvious cause or heart disease.  Other times, they can be caused by an electrolyte change in the blood. These need to be corrected. They can also be seen when there is not enough oxygen going to the heart. A common cause of this is plaque or cholesterol buildup. This buildup decreases the blood supply to the heart. In addition, extra beats may be caused or aggravated by:   Excessive smoking.   Alcohol consumption.   Caffeine.   Certain medications   Some street drugs.  SYMPTOMS     The sensation of feeling your heart skipping a beat (palpitations).   In many cases, the person may have no symptoms.  SIGNS AND TESTS     A physical examination may show an occasional irregularity, but if the PVC beats do not happen often, they may not be found on physical exam.   Blood pressure is usually normal.   Other tests that may find extra beats of the heart are:   An EKG (electrocardiogram)   A Holter monitor which can monitor your heart over longer periods of time   An Angiogram (study of the heart arteries).  TREATMENT    Usually extra heartbeats do not need treatment. The condition is treated only if symptoms are severe or if extra beats are very frequent or are causing problems. An underlying cause, if discovered, may also require treatment.    Treatment may also be needed if there may be a risk for other more serious cardiac arrhythmias.    PREVENTION     Moderation in caffeine, alcohol, and tobacco use may reduce the risk of ectopic heartbeats in some people.   Exercise often helps people who lead a sedentary (inactive) lifestyle.  PROGNOSIS    PVC heartbeats are generally harmless and do not need treatment.    RISKS AND COMPLICATIONS     Ventricular tachycardia (occasionally).   There usually are no complications.    Other arrhythmias (occasionally).  SEEK IMMEDIATE MEDICAL CARE IF:     You feel palpitations that are frequent or continual.   You develop chest pain or other problems such as shortness of breath, sweating, or nausea and vomiting.   You become light-headed or faint (pass out).   You get worse or do not improve with treatment.  Document Released: 07/14/2004 Document Revised: 11/16/2011 Document Reviewed: 01/24/2008  [employer] Patient Information 2012 [employer].

## 2012-06-05 ENCOUNTER — Telehealth (HOSPITAL_COMMUNITY): Payer: Self-pay | Admitting: *Deleted

## 2012-06-05 NOTE — ED Notes (Signed)
Referral request sent by Dr. Lorenz Coaster to Dr. Marca Ancona for PVC's. He asked me to follow it up.  I called the office and spoke with Caryn Bee. He said he can see the referral but don't act on it, until we call.  His first available with Dr. Shirlee Latch was 8/2 @ 1030.  Dr. Lorenz Coaster has left for the day. Discussed with Dr. Chaney Malling and she said 1-2 weeks would be more appropriate, because pt. may need a holter monitor.  I asked for an earlier appointment. Appointment scheduled for 7/5 @ 1130. Pt. should arrive @ 1115 to register. He will mail a new patient packet. I called and left message for pt. to call.  Pt. called back shortly thereafter and given this information. She said she has the orange card. I told her they can see that in the system, but she should call to make sure she does not have a co-pay or upfront payment. She voiced understanding.  Message sent to Dr. Lorenz Coaster. Vassie Moselle 06/05/2012

## 2012-06-14 ENCOUNTER — Encounter: Payer: Self-pay | Admitting: Cardiology

## 2012-06-14 ENCOUNTER — Ambulatory Visit (INDEPENDENT_AMBULATORY_CARE_PROVIDER_SITE_OTHER): Payer: Self-pay | Admitting: Cardiology

## 2012-06-14 VITALS — BP 120/79 | HR 101 | Ht 66.0 in | Wt 208.8 lb

## 2012-06-14 DIAGNOSIS — E663 Overweight: Secondary | ICD-10-CM | POA: Insufficient documentation

## 2012-06-14 DIAGNOSIS — R002 Palpitations: Secondary | ICD-10-CM

## 2012-06-14 MED ORDER — METOPROLOL TARTRATE 25 MG PO TABS: ORAL_TABLET | ORAL | Status: DC

## 2012-06-14 NOTE — Assessment & Plan Note (Signed)
The patient understands the need to lose weight with diet and exercise. We have discussed specific strategies for this.  

## 2012-06-14 NOTE — Patient Instructions (Addendum)
Your physician has recommended you make the following change in your medication: Start metoprolol 25mg  every 8-12 hours as needed for palpitations  Your physician has recommended that you wear an event monitor for 21 days. Event monitors are medical devices that record the heart's electrical activity. Doctors most often [place] these monitors to diagnose arrhythmias. Arrhythmias are problems with the speed or rhythm of the heartbeat. The monitor is a small, portable device. You can wear one while you do your normal daily activities. This is usually used to diagnose what is causing palpitations/syncope (passing out).  Your physician has requested that you have an echocardiogram. Echocardiography is a painless test that uses sound waves to create images of your heart. It provides your doctor with information about the size and shape of your heart and how well your heart's chambers and valves are working. This procedure takes approximately one hour. There are no restrictions for this procedure.  Your physician wants you to follow-up in: 3 months.  You will receive a reminder letter in the mail two months in advance. If you don't receive a letter, please call our office to schedule the follow-up appointment.

## 2012-06-14 NOTE — Assessment & Plan Note (Signed)
To further evaluate she will have an echocardiogram at 21 day event monitor. Check a TSH. She will be given prescription for 25 mg metoprolol when necessary. Further evaluation and treatment will be based on symptoms and the results of the studies.

## 2012-06-14 NOTE — Progress Notes (Signed)
   HPI The patient presents for evaluation of palpitations. She's had no prior cardiac history. However, for one-year she's had palpitations. She describes a fluttering or skipping. She's not describing sustained tachypalpitations. She's not had any presyncope or syncope. She has been seen in urgent care a couple of times for this. In April of symptoms that lasted for a day. It may have lasted for a week. She cannot bring these on. That happens sporadically throughout the day. She doesn't describe any chest pressure, neck or arm discomfort. She doesn't have shortness of breath, PND or orthopnea. She has had no sudden weight or edema.  No Known Allergies  Current Outpatient Prescriptions  Medication Sig Dispense Refill  . DISCONTD: ipratropium (ATROVENT) 0.06 % nasal spray Place 2 sprays into the nose 4 (four) times daily.  15 mL  12    Past Medical History  Diagnosis Date  . GERD (gastroesophageal reflux disease)     Past Surgical History  Procedure Date  . Cesarean section   . Tubal ligation     Family History  Problem Relation Age of Onset  . Diabetes Father     History   Social History  . Marital Status: Single    Spouse Name: N/A    Number of Children: 4  . Years of Education: N/A   Occupational History  .     Social History Main Topics  . Smoking status: Former Smoker    Quit date: 06/15/2007  . Smokeless tobacco: Not on file  . Alcohol Use: No     Occasional use  . Drug Use: No  . Sexually Active: Yes    Birth Control/ Protection: Surgical, Condom   Other Topics Concern  . Not on file   Social History Narrative   Lives at home with four children.      ROS:  As stated in the HPI and negative for all other systems.   PHYSICAL EXAM BP 120/79  Pulse 101  Ht 5\' 6"  (1.676 m)  Wt 208 lb 12.8 oz (94.711 kg)  BMI 33.70 kg/m2  LMP 05/28/2012 GENERAL:  Well appearing HEENT:  Pupils equal round and reactive, fundi not visualized, oral mucosa  unremarkable NECK:  No jugular venous distention, waveform within normal limits, carotid upstroke brisk and symmetric, no bruits, no thyromegaly LYMPHATICS:  No cervical, inguinal adenopathy LUNGS:  Clear to auscultation bilaterally BACK:  No CVA tenderness CHEST:  Unremarkable HEART:  PMI not displaced or sustained,S1 and S2 within normal limits, no S3, no S4, no clicks, no rubs, no murmurs ABD:  Flat, positive bowel sounds normal in frequency in pitch, no bruits, no rebound, no guarding, no midline pulsatile mass, no hepatomegaly, no splenomegaly EXT:  2 plus pulses throughout, no edema, no cyanosis no clubbing SKIN:  No rashes no nodules NEURO:  Cranial nerves II through XII grossly intact, motor grossly intact throughout PSYCH:  Cognitively intact, oriented to person place and time   EKG:  Sinus rhythm, rate 78, axis within normal limits, intervals within normal limits, no acute ST-T wave changes. 06/04/12  ASSESSMENT AND PLAN

## 2012-06-18 ENCOUNTER — Encounter (INDEPENDENT_AMBULATORY_CARE_PROVIDER_SITE_OTHER): Payer: Self-pay

## 2012-06-18 ENCOUNTER — Ambulatory Visit (HOSPITAL_COMMUNITY): Payer: Self-pay | Attending: Cardiology | Admitting: Radiology

## 2012-06-18 DIAGNOSIS — R002 Palpitations: Secondary | ICD-10-CM | POA: Insufficient documentation

## 2012-06-18 DIAGNOSIS — E669 Obesity, unspecified: Secondary | ICD-10-CM | POA: Insufficient documentation

## 2012-06-18 DIAGNOSIS — Z87891 Personal history of nicotine dependence: Secondary | ICD-10-CM | POA: Insufficient documentation

## 2012-06-18 NOTE — Progress Notes (Signed)
Echocardiogram performed.  

## 2012-09-13 ENCOUNTER — Ambulatory Visit: Payer: Self-pay | Admitting: Cardiology

## 2012-10-23 ENCOUNTER — Encounter: Payer: Self-pay | Admitting: Cardiology

## 2012-11-13 ENCOUNTER — Other Ambulatory Visit (HOSPITAL_COMMUNITY)
Admission: RE | Admit: 2012-11-13 | Discharge: 2012-11-13 | Disposition: A | Discharge status: Home / Self Care | Payer: Self-pay | Source: Ambulatory Visit | Attending: Family Medicine | Admitting: Family Medicine

## 2012-11-13 ENCOUNTER — Ambulatory Visit (INDEPENDENT_AMBULATORY_CARE_PROVIDER_SITE_OTHER): Payer: Self-pay | Admitting: Family Medicine

## 2012-11-13 ENCOUNTER — Encounter: Payer: Self-pay | Admitting: Family Medicine

## 2012-11-13 VITALS — BP 128/74 | HR 81 | Temp 98.2°F | Ht 66.0 in | Wt 194.0 lb

## 2012-11-13 DIAGNOSIS — K219 Gastro-esophageal reflux disease without esophagitis: Secondary | ICD-10-CM | POA: Insufficient documentation

## 2012-11-13 DIAGNOSIS — Z9189 Other specified personal risk factors, not elsewhere classified: Secondary | ICD-10-CM

## 2012-11-13 DIAGNOSIS — Z113 Encounter for screening for infections with a predominantly sexual mode of transmission: Secondary | ICD-10-CM | POA: Insufficient documentation

## 2012-11-13 DIAGNOSIS — Z202 Contact with and (suspected) exposure to infections with a predominantly sexual mode of transmission: Secondary | ICD-10-CM | POA: Insufficient documentation

## 2012-11-13 MED ORDER — OMEPRAZOLE 20 MG PO CPDR: 20.0000 mg | DELAYED_RELEASE_CAPSULE | Freq: Every day | ORAL | Status: DC

## 2012-11-13 NOTE — Patient Instructions (Signed)
It was nice to meet you today!  I will call you with your lab results if I need to. Please come back and see me as needed.  Take care! Rilley Poulter M. Aviana Shevlin, M.D.

## 2012-11-13 NOTE — Assessment & Plan Note (Signed)
Pt states she was diagnosed with Hpylori. Not currently very symptomatic but does not take PPI. Will start Prilosec today to take as needed when her reflux is really bothering her.

## 2012-11-13 NOTE — Progress Notes (Signed)
Patient ID: [name], female   DOB: [birth year], 36 y.o.   MRN: [id number]  [facility] Amber M. Hairford, MD Phone: [phone]  Subjective: HPI: Patient is a 36 y.o. female presenting to clinic today for new patient appointment. [facility] Concerns today include wants to be checked for STD.  1. STD- [facility] check [facility], all negative except BV. Last HIV and RPR was last December, also negative. Patient states she was in a relationship that was not faithful. Concerned that she may have been exposed to something. No vaginal discharge, no abdominal pain, no odor. Some vaginal discomfort and itching.    History Reviewed: Former smoker. Health Maintenance:   Flu shot- Never had one  Mammogram- 2013  Pap smear- [facility] Dec 2012 (never had abnormal)  Last labs- Dec 2012  ROS: Please see HPI above.  Objective: Office vital signs reviewed.  Physical Examination:  General: Awake, alert. NAD HEENT: Atraumatic, normocephalic Neck: No masses palpated. No LAD Pulm: CTAB, no wheezes Cardio: RRR, no murmurs appreciated Abdomen:+BS, soft, nontender, nondistended Extremities: No edema Neuro: Grossly intact  Assessment: 36 yo F new patient appointment  Plan: See Problem List and After Visit Summary

## 2012-11-13 NOTE — Assessment & Plan Note (Signed)
Patient checked one year ago, but was in a relationship with high risk exposure. Will check HIV, RPR and urine GC/Ch. Will call patient with results. Otherwise, she can return to clinic as needed.

## 2012-11-14 ENCOUNTER — Telehealth: Payer: Self-pay | Admitting: Family Medicine

## 2012-11-14 DIAGNOSIS — IMO0001 Reserved for inherently not codable concepts without codable children: Secondary | ICD-10-CM

## 2012-11-14 NOTE — Telephone Encounter (Signed)
Patient was in yesterday and forgot to ask for a referral to see a dentist that takes the [employer].

## 2012-11-15 NOTE — Telephone Encounter (Signed)
Spoke with pt and told her about the process with [facility].  She is agreeable to being on the waiting list and is aware that it may take months.  Advised I would put in the referral and get the process started.  Referral needed for cavities/sensitivity. Lynett Brasil, Maryjo Rochester

## 2012-11-19 NOTE — Telephone Encounter (Signed)
Pt informed that her referral could not be processed since her card expires on 11/28/12.  She would need to get the card renewed first.  Pt states "even though it does not expire for 9 more days"  Advised that they would not be able to get her in in that 9 days so the card would need to be renewed first.  Pt upset but agreeable.  Will call to schedule appt with Britta Mccreedy. Fleeger, Maryjo Rochester

## 2012-12-31 ENCOUNTER — Telehealth: Payer: Self-pay | Admitting: Family Medicine

## 2012-12-31 NOTE — Telephone Encounter (Signed)
Is starting a cold sore and is asking if she can have something called in to [facility] [address]

## 2012-12-31 NOTE — Telephone Encounter (Signed)
I do not see that she has ever been given anything for cold sore. She should come in for same day visit for Rx, or use OTC medication for symptomatic relief.  Thanks, Hospital doctor

## 2013-01-01 NOTE — Telephone Encounter (Signed)
Related message,pt states she'll schedule appt to be seen. Javonn Gauger, Virgel Bouquet

## 2013-04-23 ENCOUNTER — Encounter: Payer: Self-pay | Admitting: Family Medicine

## 2013-05-02 ENCOUNTER — Ambulatory Visit (INDEPENDENT_AMBULATORY_CARE_PROVIDER_SITE_OTHER): Payer: No Typology Code available for payment source | Admitting: Family Medicine

## 2013-05-02 ENCOUNTER — Encounter: Payer: Self-pay | Admitting: Family Medicine

## 2013-05-02 VITALS — BP 109/70 | HR 87 | Temp 97.8°F | Ht 66.0 in | Wt 204.0 lb

## 2013-05-02 DIAGNOSIS — K219 Gastro-esophageal reflux disease without esophagitis: Secondary | ICD-10-CM

## 2013-05-02 DIAGNOSIS — Z020 Encounter for examination for admission to educational institution: Secondary | ICD-10-CM

## 2013-05-02 DIAGNOSIS — Z202 Contact with and (suspected) exposure to infections with a predominantly sexual mode of transmission: Secondary | ICD-10-CM

## 2013-05-02 DIAGNOSIS — Z0000 Encounter for general adult medical examination without abnormal findings: Secondary | ICD-10-CM | POA: Insufficient documentation

## 2013-05-02 DIAGNOSIS — Z9189 Other specified personal risk factors, not elsewhere classified: Secondary | ICD-10-CM

## 2013-05-02 DIAGNOSIS — Z0289 Encounter for other administrative examinations: Secondary | ICD-10-CM

## 2013-05-02 LAB — BASIC METABOLIC PANEL
CO2: 25 mEq/L (ref 19–32)
Calcium: 9 mg/dL (ref 8.4–10.5)
Chloride: 108 mEq/L (ref 96–112)
Creat: 0.76 mg/dL (ref 0.50–1.10)
Glucose, Bld: 89 mg/dL (ref 70–99)

## 2013-05-02 LAB — POCT URINALYSIS DIPSTICK
Bilirubin, UA: NEGATIVE
Ketones, UA: NEGATIVE
Protein, UA: NEGATIVE
Spec Grav, UA: >=1.03

## 2013-05-02 LAB — CBC
HCT: 35.7 % — ABNORMAL LOW (ref 36.0–46.0)
Hemoglobin: 11.7 g/dL — ABNORMAL LOW (ref 12.0–15.0)
MCH: 28.7 pg (ref 26.0–34.0)
MCV: 87.7 fL (ref 78.0–100.0)
RBC: 4.07 MIL/uL (ref 3.87–5.11)
WBC: 8.2 10*3/uL (ref 4.0–10.5)

## 2013-05-02 MED ORDER — OMEPRAZOLE 20 MG PO CPDR: 20.0000 mg | DELAYED_RELEASE_CAPSULE | Freq: Every day | ORAL | Status: DC

## 2013-05-02 NOTE — Assessment & Plan Note (Signed)
Pt needed for school physical for [employer], as well as routine CPE without pap. Will check CBC, Bmet and lipid panel (future) for wellness. Also, for school she is due for immunization titers and UA. Pt overall very healthy. Hearing, vision and PE wnl. Will print off results when they are returned for her to give her school when she comes in for her cholesterol labs. Pt also due for Tdap which was given today.

## 2013-05-02 NOTE — Patient Instructions (Signed)
It was good to see you today!  We will check your labs today. You will need to come back one morning for your cholesterol check before breakfast. If you come back after next Wednesday, we can hopefully give you a copy of your labs to take for school.  Gwendloyn Forsee M. Dianne Whelchel, M.D.

## 2013-05-02 NOTE — Assessment & Plan Note (Signed)
Previous dx of H.pylori but could not tolerate Abx. Will start treatment with Prilosec to see if that will control her symptoms. If not, will discuss restarting antibiotic treatment for H.pylori. Pt agrees.

## 2013-05-02 NOTE — Assessment & Plan Note (Signed)
Will screen for HIV today. No other concerns, per pt.

## 2013-05-02 NOTE — Progress Notes (Signed)
Patient ID: Melanie Vincent, female   DOB: June 22, 1976, 37 y.o.   MRN: [id number]  [facility] Bertine Schlottman M. Yair Dusza, MD Phone: [phone]   Subjective: HPI: Patient is a 37 y.o. female presenting to clinic today for school physical without pap smear.  Concerns today include: wanting screening labs  1. Physical Exam - UTD on pap, flu shot, TB test. Had hearing, and vision screening today. Needs immunization titers today for school. Needs labs today also including CBC, HIV (no known exposure but partner is now an ex), and lipid panel.  2. H.pylori - Was diagnosed at [facility] with H.pylori on a blood test. Started taking the antibiotics for this but was not able to tolerate the antibiotics. Continue to have belching, nausea and burning. Was on Prilosec which did well, but no longer has Rx.  History Reviewed: Former smoker. Some alcohol use. No current drug use.   ROS: Please see HPI above.  Objective: Office vital signs reviewed. BP 109/70  Pulse 87  Temp(Src) 97.8 F (36.6 C) (Oral)  Ht 5\' 6"  (1.676 m)  Wt 204 lb (92.534 kg)  BMI 32.94 kg/m2  LMP 04/28/2013  Physical Examination:  General: Awake, alert. NAD. Very pleasant HEENT: Atraumatic, normocephalic.MMM. Full extraocular movements. Posterior pharynx clear Neck: No masses palpated. No LAD Pulm: CTAB, no wheezes Cardio: RRR, no murmurs appreciated Abdomen:+BS, soft, nontender, nondistended Extremities: No edema Neuro: Grossly intact  Assessment: 37 y.o. female physical exam  Plan: See Problem List and After Visit Summary

## 2013-05-06 LAB — RUBELLA SCREEN: Rubella: 4.87 Index — ABNORMAL HIGH (ref <0.90)

## 2013-05-06 LAB — RUBEOLA ANTIBODY IGG: Rubeola IgG: 23.5 AU/mL (ref <25.00)

## 2013-05-06 LAB — VARICELLA ZOSTER ANTIBODY, IGG: Varicella IgG: 2141 Index — ABNORMAL HIGH (ref <135.00)

## 2013-05-06 LAB — HEPATITIS B SURFACE ANTIBODY, QUANTITATIVE: Hepatitis B-Post: 0.3 m[IU]/mL

## 2013-05-06 LAB — MUMPS ANTIBODY, IGG: Mumps IgG: 5.02 AU/mL (ref <9.00)

## 2013-05-08 ENCOUNTER — Telehealth: Payer: Self-pay | Admitting: Family Medicine

## 2013-05-08 NOTE — Telephone Encounter (Signed)
Patient's lab results show that she is not immune to Hep B, Mumps or Measles. 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  Please advise patient that her lab results are ready and she can pick up a copy at the front desk, and also that I recommend she make nurse appointment for immunizations prior to starting school.  Thanks! Ruhani Umland M. Azizah Lisle, M.D.

## 2013-05-08 NOTE — Telephone Encounter (Signed)
Pt notified needs immunizations.  Labs printed and placed up front for pick up.  Gavan Nordby, Darlyne Russian, CMA

## 2013-07-28 ENCOUNTER — Telehealth: Payer: Self-pay | Admitting: *Deleted

## 2013-07-28 MED ORDER — VALACYCLOVIR HCL 1 G PO TABS: ORAL_TABLET | ORAL | Status: DC

## 2013-07-28 NOTE — Telephone Encounter (Signed)
Pt reports that she was dx with herpes on her mouth and has no valtrex and is broken out with blisters - can she have valtrex called in if possible? Wyatt Haste, RN-BSN

## 2013-07-28 NOTE — Telephone Encounter (Signed)
Pt called and given message - verbalized understanding. Wyatt Haste, RN-BSN

## 2013-07-28 NOTE — Telephone Encounter (Signed)
Valtrex sent to [facility]. She only needs one day treatment for fever blisters. She will take 2g (2 tabs) by mouth twice at least 12 hours apart.  If problem recurs, she should come in for visit.  Thanks, [employer]. Hairford, M.D.

## 2013-07-29 ENCOUNTER — Ambulatory Visit (INDEPENDENT_AMBULATORY_CARE_PROVIDER_SITE_OTHER): Payer: No Typology Code available for payment source | Admitting: Family Medicine

## 2013-07-29 ENCOUNTER — Encounter: Payer: Self-pay | Admitting: Family Medicine

## 2013-07-29 VITALS — BP 135/81 | HR 94 | Temp 98.3°F | Ht 66.0 in | Wt 198.0 lb

## 2013-07-29 DIAGNOSIS — B009 Herpesviral infection, unspecified: Secondary | ICD-10-CM

## 2013-07-29 DIAGNOSIS — B001 Herpesviral vesicular dermatitis: Secondary | ICD-10-CM | POA: Insufficient documentation

## 2013-07-29 MED ORDER — VALACYCLOVIR HCL 1 G PO TABS: 1000.0000 mg | ORAL_TABLET | Freq: Two times a day (BID) | ORAL | Status: DC

## 2013-07-29 MED ORDER — IBUPROFEN 600 MG PO TABS: 600.0000 mg | ORAL_TABLET | Freq: Three times a day (TID) | ORAL | Status: DC | PRN

## 2013-07-29 NOTE — Progress Notes (Signed)
Subjective:     Patient ID: Melanie Vincent, female   DOB: [birth year], 37 y.o.   MRN: [id number]  HPI 37 yo F with hx of cold sore presents with cold sore. Started 4 days ago while on vacation in [place]. Took valtrex after returning home. Has persistent pain, malaise, headache. No fever.   Review of Systems As per HPI     Objective:   Physical Exam BP 135/81  Pulse 94  Temp(Src) 98.3 F (36.8 C) (Oral)  Ht 5\' 6"  (1.676 m)  Wt 198 lb (89.812 kg)  BMI 31.97 kg/m2  LMP 07/28/2013 General appearance: alert, cooperative and no distress Head: Normocephalic, without obvious abnormality, atraumatic Eyes: conjunctivae/corneas clear. PERRL, EOM's intact.  Throat: lips, mucosa, and tongue normal; teeth and gums normal Skin: cluster of vesicles L upper vermilion border.     Assessment and Plan:

## 2013-07-29 NOTE — Assessment & Plan Note (Addendum)
A: recurrent cold sore. More severe than usual due to delay in treatment.  P: Please take valtrex 1000 mg twice daily (12 hrs apart) for next two days. Use ibuprofen as needed for pain.

## 2013-07-29 NOTE — Patient Instructions (Addendum)
Ms. Muraoka,  Thank you for coming in today. Please take valtrex 1000 mg twice daily (12 hrs apart) for next two days. Use ibuprofen as needed.   It will take time to heal over.   Follow up with Dr. Mikel Cella as needed.   Dr. Armen Pickup

## 2013-07-30 ENCOUNTER — Telehealth: Payer: Self-pay | Admitting: Family Medicine

## 2013-07-30 NOTE — Telephone Encounter (Signed)
Pt is calling because the prescription for Valtex has confusing instruction and the [facility] pharmacy would fill it until the prescriptions is fixed. JW

## 2013-07-30 NOTE — Telephone Encounter (Signed)
[facility] After Hours Line.   Daughter recently treated for MRSA abscess. Patient states she has noted one on her right flank. Wants to know if she needs abx. Told patient to come in for appointment tomorrow for it to be evaluated. Told her may need I+d, may need abx, or may simply be able to be observed. Advised warm compresses until she is seen. Afebrile, no nausea, vomiting. Slight fatigue.   Aldine Contes. Marti Sleigh, MD, PGY3 07/30/2013 6:42 PM

## 2013-07-30 NOTE — Telephone Encounter (Signed)
[facility] called and prescription clarified - pt informed. Wyatt Haste, RN-BSN

## 2013-08-06 ENCOUNTER — Ambulatory Visit: Payer: No Typology Code available for payment source | Admitting: Family Medicine

## 2013-08-20 ENCOUNTER — Other Ambulatory Visit (HOSPITAL_COMMUNITY)
Admission: RE | Admit: 2013-08-20 | Discharge: 2013-08-20 | Disposition: A | Discharge status: Home / Self Care | Payer: No Typology Code available for payment source | Source: Ambulatory Visit | Attending: Family Medicine | Admitting: Family Medicine

## 2013-08-20 ENCOUNTER — Ambulatory Visit (INDEPENDENT_AMBULATORY_CARE_PROVIDER_SITE_OTHER): Payer: No Typology Code available for payment source | Admitting: Family Medicine

## 2013-08-20 ENCOUNTER — Encounter: Payer: Self-pay | Admitting: Family Medicine

## 2013-08-20 VITALS — BP 107/67 | HR 73 | Temp 98.2°F | Wt 202.0 lb

## 2013-08-20 DIAGNOSIS — Z113 Encounter for screening for infections with a predominantly sexual mode of transmission: Secondary | ICD-10-CM | POA: Insufficient documentation

## 2013-08-20 DIAGNOSIS — B009 Herpesviral infection, unspecified: Secondary | ICD-10-CM

## 2013-08-20 DIAGNOSIS — B001 Herpesviral vesicular dermatitis: Secondary | ICD-10-CM

## 2013-08-20 LAB — POCT WET PREP (WET MOUNT): Clue Cells Wet Prep Whiff POC: POSITIVE

## 2013-08-20 MED ORDER — VALACYCLOVIR HCL 1 G PO TABS: 1000.0000 mg | ORAL_TABLET | Freq: Two times a day (BID) | ORAL | Status: DC

## 2013-08-20 MED ORDER — METRONIDAZOLE 0.75 % VA GEL: 1.0000 | Freq: Two times a day (BID) | VAGINAL | Status: AC

## 2013-08-20 NOTE — Patient Instructions (Addendum)
I have given you a prescription to take as needed when you get a sensation of another cold sore.  I will call you if any of your labs are positive.  [place] M. Zyen Triggs, M.D.

## 2013-08-20 NOTE — Progress Notes (Signed)
Patient ID: [name], female   DOB: [birth year], 37 y.o.   MRN: [id number]  [facility] Lorie Cleckley M. Shonteria Abeln, MD Phone: [phone]   Subjective: HPI: Patient is a 37 y.o. female presenting to clinic today for follow up for fever blister.  Started on August 15. Had cold sore on upper left lip and she had tingling that went up left cheek and to the eye. She said initially she was in a lot of pain and felt swollen. She was seen at the [facility] one week later, she was treated with Valtrex 1g x2 days which helped some. Re-evaluated and given another 2 days. She states she has tried acyclovir which did not help. She states it is improving and does not want treatment today but since this one was so severe she would like Rx for prophylaxis.  History Reviewed: Fomer smoker.  ROS: Please see HPI above.  Objective: Office vital signs reviewed. BP 107/67  Pulse 73  Temp(Src) 98.2 F (36.8 C) (Oral)  Wt 202 lb (91.627 kg)  BMI 32.62 kg/m2  LMP 07/28/2013  Physical Examination:  General: Awake, alert. NAD HEENT: Atraumatic, normocephalic. Pink lesion on left upper lip border without blister or surrounding erythema. Good movement of mouth. No other rash on face. Neck: No masses palpated. 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

## 2013-08-21 NOTE — Assessment & Plan Note (Signed)
No treatment today since she is healing up. Will give Rx for Valtrex to have on hand for next time she feels the tingling she can take Valtrex 1g BID x2 doses to help reduce severity of fever blister. RTC as needed.

## 2013-09-19 ENCOUNTER — Ambulatory Visit (INDEPENDENT_AMBULATORY_CARE_PROVIDER_SITE_OTHER): Payer: No Typology Code available for payment source | Admitting: *Deleted

## 2013-09-19 DIAGNOSIS — Z111 Encounter for screening for respiratory tuberculosis: Secondary | ICD-10-CM

## 2013-09-19 NOTE — Progress Notes (Signed)
Tuberculin skin test applied to left ventral forearm. Explained how to read the test, measuring induration not just erythema; she will come into office in 48-72 hours to have test read. Patient expressed understandiing.

## 2013-09-22 ENCOUNTER — Encounter: Payer: Self-pay | Admitting: *Deleted

## 2013-09-22 ENCOUNTER — Ambulatory Visit (INDEPENDENT_AMBULATORY_CARE_PROVIDER_SITE_OTHER): Payer: No Typology Code available for payment source | Admitting: *Deleted

## 2013-09-22 DIAGNOSIS — Z111 Encounter for screening for respiratory tuberculosis: Secondary | ICD-10-CM

## 2013-10-08 ENCOUNTER — Ambulatory Visit: Payer: No Typology Code available for payment source

## 2014-01-06 ENCOUNTER — Ambulatory Visit: Payer: No Typology Code available for payment source | Admitting: Family Medicine

## 2014-05-22 ENCOUNTER — Ambulatory Visit: Payer: No Typology Code available for payment source | Admitting: Family Medicine

## 2014-05-28 ENCOUNTER — Ambulatory Visit (INDEPENDENT_AMBULATORY_CARE_PROVIDER_SITE_OTHER): Payer: Self-pay | Admitting: Family Medicine

## 2014-05-28 ENCOUNTER — Encounter: Payer: Self-pay | Admitting: Family Medicine

## 2014-05-28 VITALS — BP 111/77 | HR 83 | Temp 98.1°F | Wt 217.0 lb

## 2014-05-28 DIAGNOSIS — R300 Dysuria: Secondary | ICD-10-CM | POA: Insufficient documentation

## 2014-05-28 DIAGNOSIS — M549 Dorsalgia, unspecified: Secondary | ICD-10-CM | POA: Insufficient documentation

## 2014-05-28 LAB — POCT URINALYSIS DIPSTICK
Bilirubin, UA: NEGATIVE
Blood, UA: NEGATIVE
GLUCOSE UA: NEGATIVE
KETONES UA: NEGATIVE
NITRITE UA: NEGATIVE
PH UA: 6.5
Protein, UA: NEGATIVE
Spec Grav, UA: 1.025
Urobilinogen, UA: 0.2

## 2014-05-28 LAB — POCT UA - MICROSCOPIC ONLY

## 2014-05-28 MED ORDER — CYCLOBENZAPRINE HCL 10 MG PO TABS: 10.0000 mg | ORAL_TABLET | Freq: Three times a day (TID) | ORAL | Status: DC | PRN

## 2014-05-28 MED ORDER — METRONIDAZOLE 500 MG PO TABS: 500.0000 mg | ORAL_TABLET | Freq: Two times a day (BID) | ORAL | Status: DC

## 2014-05-28 MED ORDER — MELOXICAM 15 MG PO TABS: 15.0000 mg | ORAL_TABLET | Freq: Every day | ORAL | Status: DC

## 2014-05-28 MED ORDER — TRAMADOL HCL 50 MG PO TABS: 50.0000 mg | ORAL_TABLET | Freq: Three times a day (TID) | ORAL | Status: DC | PRN

## 2014-05-28 MED ORDER — HYDROCODONE-ACETAMINOPHEN 5-325 MG PO TABS: 1.0000 | ORAL_TABLET | Freq: Four times a day (QID) | ORAL | Status: DC | PRN

## 2014-05-28 NOTE — Progress Notes (Signed)
Patient ID: Melanie HawJennifer C Vincent, female   DOB: 1976/06/10, 38 y.o.   MRN: [id number]    Subjective: HPI: Patient is a 38 y.o. female presenting to clinic today for back pain and dysuria.  Back Pain Patient presents for evaluation of low back problems. Symptoms have been present for several months and include sciatic pain. Initial inciting event: none. Symptoms are worse in the morning. Alleviating factors identifiable by the patient are sitting. Aggravating factors identifiable by the patient are recumbency and standing. Treatments initiated by the patient: none. Previous lower back problems: none. Previous work up: none. Previous treatments: OTC medication.  UTI Reports dysuria last week with lower abdominal pain. No vaginal discharge, no hematuria.  History Reviewed: Fomer smoker.  ROS: Please see HPI above.  Objective: Office vital signs reviewed. BP 111/77  Pulse 83  Temp(Src) 98.1 F (36.7 C) (Oral)  Wt 217 lb (98.431 kg)  LMP 05/07/2014  Physical Examination:  General: Awake, alert. NAD HEENT: Atraumatic, normocephalic. MMM. Pulm: CTAB, no wheezes Cardio: RRR, no murmurs appreciated Abdomen:+BS, soft, nondistended. TTP supraumbilical Back: No midline tenderness. No muscle spasm appreciated. Right SI tenderness. Good ROM of back and hips bilaterally. Extremities: No edema Neuro: Strength and sensation grossly intact  Assessment: 38 y.o. female clinic appointment  Plan: See Problem List and After Visit Summary

## 2014-05-28 NOTE — Assessment & Plan Note (Signed)
A: No red flags, however without known injury and persistent pain will do better work up. DDx includes muscle strain, SI joint inflammation, herniated disc.  P: - Lumbar xray - Flexeril prn - Vicodin prn - Ice - Gentle stretches - F/u if not improving

## 2014-05-28 NOTE — Addendum Note (Signed)
Addended by: Hilarie FredricksonHAIRFORD, AMBER M on: 05/28/2014 11:31 AM   Modules accepted: Orders

## 2014-05-28 NOTE — Assessment & Plan Note (Signed)
A: Progressively worsening dysuria in setting of back pain.  P: - UA does not show UTI, but incidental finding of trich - Flagyl sent to pharmacy.  - Unable to reach patient. Left VM to call back.

## 2014-05-28 NOTE — Patient Instructions (Signed)
Please get the Xray of your back when you can. Over the weekend try the pain medication and muscle relaxer to see if that will help.   Mckinzee Spirito M. Carmyn Hamm, M.D.

## 2014-06-04 ENCOUNTER — Telehealth: Payer: Self-pay | Admitting: Family Medicine

## 2014-06-04 ENCOUNTER — Ambulatory Visit (HOSPITAL_COMMUNITY)
Admission: RE | Admit: 2014-06-04 | Discharge: 2014-06-04 | Disposition: A | Discharge status: Home / Self Care | Payer: Self-pay | Source: Ambulatory Visit | Attending: Family Medicine | Admitting: Family Medicine

## 2014-06-04 DIAGNOSIS — M545 Low back pain, unspecified: Secondary | ICD-10-CM | POA: Insufficient documentation

## 2014-06-04 DIAGNOSIS — M549 Dorsalgia, unspecified: Secondary | ICD-10-CM

## 2014-06-04 NOTE — Telephone Encounter (Signed)
Please let Ms. Upton know her back X-ray was normal. Nothing concerning. I hope she is feeling better.  Thanks! Yonas Bunda M. Merryn Thaker, M.D.

## 2014-06-04 NOTE — Telephone Encounter (Signed)
Patient is glad to hear about a negative xray.  She would like to know what she needs to do since her pain is still present.  The pain medication does help but she is out of them and she still uses the muscle relaxer as needed.  Please advise.  Jazmin Hartsell,CMA

## 2014-06-05 NOTE — Telephone Encounter (Signed)
LMOVM for pt to call back.  Fleeger, Jessica Dawn  

## 2014-06-05 NOTE — Telephone Encounter (Signed)
Chronic narcotics are not indicated. She should use antiinflammatory medication such as Aleve twice daily. Continue normal activities and stay active. This is likely muscular in nature.  Thank you! Amber M. Hairford, M.D.

## 2014-06-14 ENCOUNTER — Encounter (HOSPITAL_COMMUNITY): Payer: Self-pay | Admitting: *Deleted

## 2014-06-14 ENCOUNTER — Inpatient Hospital Stay (HOSPITAL_COMMUNITY): Payer: Medicaid Other

## 2014-06-14 ENCOUNTER — Inpatient Hospital Stay (HOSPITAL_COMMUNITY)
Admission: AD | Admit: 2014-06-14 | Discharge: 2014-06-15 | Disposition: A | Discharge status: Home / Self Care | Payer: Medicaid Other | Source: Ambulatory Visit | Attending: Obstetrics and Gynecology | Admitting: Obstetrics and Gynecology

## 2014-06-14 DIAGNOSIS — K219 Gastro-esophageal reflux disease without esophagitis: Secondary | ICD-10-CM | POA: Insufficient documentation

## 2014-06-14 DIAGNOSIS — M545 Low back pain, unspecified: Secondary | ICD-10-CM | POA: Insufficient documentation

## 2014-06-14 DIAGNOSIS — Z87891 Personal history of nicotine dependence: Secondary | ICD-10-CM | POA: Insufficient documentation

## 2014-06-14 DIAGNOSIS — R109 Unspecified abdominal pain: Secondary | ICD-10-CM | POA: Insufficient documentation

## 2014-06-14 DIAGNOSIS — N83209 Unspecified ovarian cyst, unspecified side: Secondary | ICD-10-CM

## 2014-06-14 LAB — URINALYSIS, ROUTINE W REFLEX MICROSCOPIC
Bilirubin Urine: NEGATIVE
Glucose, UA: NEGATIVE mg/dL
Hgb urine dipstick: NEGATIVE
Ketones, ur: NEGATIVE mg/dL
Leukocytes, UA: NEGATIVE
NITRITE: NEGATIVE
PH: 5 (ref 5.0–8.0)
Protein, ur: NEGATIVE mg/dL
SPECIFIC GRAVITY, URINE: 1.025 (ref 1.005–1.030)
Urobilinogen, UA: 0.2 mg/dL (ref 0.0–1.0)

## 2014-06-14 LAB — CBC
HCT: 36.4 % (ref 36.0–46.0)
Hemoglobin: 12.2 g/dL (ref 12.0–15.0)
MCH: 29.8 pg (ref 26.0–34.0)
MCHC: 33.5 g/dL (ref 30.0–36.0)
MCV: 89 fL (ref 78.0–100.0)
PLATELETS: 352 10*3/uL (ref 150–400)
RBC: 4.09 MIL/uL (ref 3.87–5.11)
RDW: 13.6 % (ref 11.5–15.5)
WBC: 8.1 10*3/uL (ref 4.0–10.5)

## 2014-06-14 LAB — POCT PREGNANCY, URINE: Preg Test, Ur: NEGATIVE

## 2014-06-14 NOTE — MAU Note (Addendum)
Pt. Has been having back pain for 2 months and went her family dr. Quincy Carneshey did an xray and that was normal. Did have a urine test at that time and was diagnosed wth an STD at that time. Took medication to treat that and is still having the back pain. Today while at work had a sharp pain in her abdomen and is experiencing cramping.Pt. Also notes that her left leg swells. Pt. Also has a feeling of pins in her feet when stands at times. Here for evaluation.

## 2014-06-14 NOTE — MAU Provider Note (Signed)
History     CSN: [id number]  Arrival date and time: 06/14/14 2139   First Provider Initiated Contact with Patient 06/14/14 2221     Chief Complaint  Patient presents with  . Back Pain  . Abdominal Pain   HPI Melanie Vincent is a 38 y.o. female who presents with low back pain & abdominal pain.   She reports intermittent low back pain x 2 months. Pain occurs daily; rates as 8/10 on pain scale & has been treating it with norco & advil. Norco helped but has run out; advil had helped, but not as much now. States standing for long periods of time aggravates the pain. Denies injury to area. Back pain has been evaluated by her PCP [facility]), had negative lumbar xray.   Lower abdominal cramping started the same time the back pain did 2 months ago but has been worsening. States constantly feels discomfort with intermittent sharp pain when bending over. Rates pain 8/10. Norco & advil had helped with abdominal pain as well. Bending over & standing for long periods of time makes pain worse. Pt finished treatment for trichomonas 1 week ago. Currently denies vaginal discharge or irritation. Has been with 1 female partner for the last year. Denies dyspareunia. States normally has regular periods except for the month of April when she had 2 episodes of bleeding in the same month. BTL & occassional condom use for contraception & STI protection.     Past Medical History  Diagnosis Date  . GERD (gastroesophageal reflux disease)   . H pylori ulcer   . Breast lump     Mammogram (last 2013)  . Sinusitis   . Herpes simplex labialis   . Palpitations 06/14/2012    Past Surgical History  Procedure Laterality Date  . Cesarean section    . Tubal ligation      2008    Family History  Problem Relation Age of Onset  . Diabetes Father     History  Substance Use Topics  . Smoking status: Former Smoker    Quit date: 06/15/2007  . Smokeless tobacco: Not on file  . Alcohol Use: Yes     Comment:  Occasional use    Allergies:  Allergies  Allergen Reactions  . Tramadol Other (See Comments)    Pt states that this medication causes insomnia and for her to have "crazy thoughts".     Prescriptions prior to admission  Medication Sig Dispense Refill  . Omeprazole Magnesium (PRILOSEC OTC PO) Take 1 capsule by mouth daily as needed.        Review of Systems  Constitutional: Positive for chills (once per week x 2 weeks) and malaise/fatigue (pt states is always tired x 6 months). Negative for fever and weight loss.  Cardiovascular: Negative for chest pain.  Gastrointestinal: Positive for abdominal pain. Negative for nausea, vomiting, diarrhea, constipation and blood in stool.  Genitourinary: Negative.  Negative for dysuria, urgency, frequency and hematuria.       Negative for vaginal discharge.   Musculoskeletal: Positive for back pain. Negative for falls and joint pain.   Physical Exam   Blood pressure 118/76, pulse 90, temperature 98.5 F (36.9 C), temperature source Oral, resp. rate 16, height 5' 6.5" (1.689 m), weight 212 lb (96.163 kg), last menstrual period 06/01/2014, SpO2 100.00%.  Physical Exam  Constitutional: She appears well-developed and well-nourished. No distress.  Cardiovascular: Normal rate, regular rhythm and normal heart sounds.   Respiratory: Effort normal and breath sounds normal.  No respiratory distress. She has no wheezes.  GI: Soft. Bowel sounds are normal. She exhibits no distension and no mass. There is tenderness (mild tenderness to deep palpation in lower abdomen). There is no rebound, no guarding and no CVA tenderness.  Genitourinary: Uterus normal. Cervix exhibits no motion tenderness, no discharge and no friability. Right adnexum displays no tenderness. Left adnexum displays no tenderness. There is bleeding around the vagina. Vaginal discharge found.  Moderate amount of off-white thick discharge in vaginal vault.   Neurological: She is alert.  Skin: She  is not diaphoretic.  Psychiatric: She has a normal mood and affect. Her behavior is normal. Judgment and thought content normal.    MAU Course  Procedures Results for orders placed during the hospital encounter of 06/14/14 (from the past 24 hour(s))  POCT PREGNANCY, URINE     Status: None   Collection Time    06/14/14 10:03 PM      Result Value Ref Range   Preg Test, Ur NEGATIVE  NEGATIVE  URINALYSIS, ROUTINE W REFLEX MICROSCOPIC     Status: None   Collection Time    06/14/14 10:04 PM      Result Value Ref Range   Color, Urine YELLOW  YELLOW   APPearance CLEAR  CLEAR   Specific Gravity, Urine 1.025  1.005 - 1.030   pH 5.0  5.0 - 8.0   Glucose, UA NEGATIVE  NEGATIVE mg/dL   Hgb urine dipstick NEGATIVE  NEGATIVE   Bilirubin Urine NEGATIVE  NEGATIVE   Ketones, ur NEGATIVE  NEGATIVE mg/dL   Protein, ur NEGATIVE  NEGATIVE mg/dL   Urobilinogen, UA 0.2  0.0 - 1.0 mg/dL   Nitrite NEGATIVE  NEGATIVE   Leukocytes, UA NEGATIVE  NEGATIVE  CBC     Status: None   Collection Time    06/14/14 11:05 PM      Result Value Ref Range   WBC 8.1  4.0 - 10.5 K/uL   RBC 4.09  3.87 - 5.11 MIL/uL   Hemoglobin 12.2  12.0 - 15.0 g/dL   HCT 16.136.4  09.636.0 - 04.546.0 %   MCV 89.0  78.0 - 100.0 fL   MCH 29.8  26.0 - 34.0 pg   MCHC 33.5  30.0 - 36.0 g/dL   RDW 40.913.6  81.111.5 - 91.415.5 %   Platelets 352  150 - 400 K/uL  WET PREP, GENITAL     Status: Abnormal   Collection Time    06/15/14 12:17 AM      Result Value Ref Range   Yeast Wet Prep HPF POC NONE SEEN  NONE SEEN   Trich, Wet Prep NONE SEEN  NONE SEEN   Clue Cells Wet Prep HPF POC NONE SEEN  NONE SEEN   WBC, Wet Prep HPF POC FEW (*) NONE SEEN   [place] Transvaginal Non-ob  06/15/2014   CLINICAL DATA:  Pelvic pain.  LMP 06/01/2014.  EXAM: TRANSABDOMINAL AND TRANSVAGINAL ULTRASOUND OF PELVIS  TECHNIQUE: Both transabdominal and transvaginal ultrasound examinations of the pelvis were performed. Transabdominal technique was performed for global imaging of the pelvis  including uterus, ovaries, adnexal regions, and pelvic cul-de-sac. It was necessary to proceed with endovaginal exam following the transabdominal exam to visualize the endometrium and ovaries.  COMPARISON:  04/16/2011  FINDINGS: Uterus  Measurements: 8.2 x 4.0 x 4.8 cm. No fibroids or other mass visualized. Prior C-section scar noted in lower uterine segment.  Endometrium  Thickness: 4 mm.  No focal abnormality visualized.  Right ovary  Measurements: 4.1 x 1.6 x 1.8 cm. A 7 mm hyperechoic focus is seen which was not visualized previously and could represent a small corpus albicans or dermoid.  Left ovary  Measurements: 2.1 x 0.8 x 1.3 cm. Normal appearance/no adnexal mass.  Other findings  No free fluid.  IMPRESSION: No acute findings.  7 mm benign appearing hyperechoic focus in the right ovary, which could represent a small corpus albicans or dermoid. Recommend follow-up by ultrasound in 6 months.   Electronically Signed   By: Myles RosenthalJohn  Stahl M.D.   On: 06/15/2014 00:19   [place] Pelvis Complete  06/15/2014   CLINICAL DATA:  Pelvic pain.  LMP 06/01/2014.  EXAM: TRANSABDOMINAL AND TRANSVAGINAL ULTRASOUND OF PELVIS  TECHNIQUE: Both transabdominal and transvaginal ultrasound examinations of the pelvis were performed. Transabdominal technique was performed for global imaging of the pelvis including uterus, ovaries, adnexal regions, and pelvic cul-de-sac. It was necessary to proceed with endovaginal exam following the transabdominal exam to visualize the endometrium and ovaries.  COMPARISON:  04/16/2011  FINDINGS: Uterus  Measurements: 8.2 x 4.0 x 4.8 cm. No fibroids or other mass visualized. Prior C-section scar noted in lower uterine segment.  Endometrium  Thickness: 4 mm.  No focal abnormality visualized.  Right ovary  Measurements: 4.1 x 1.6 x 1.8 cm. A 7 mm hyperechoic focus is seen which was not visualized previously and could represent a small corpus albicans or dermoid.  Left ovary  Measurements: 2.1 x 0.8 x 1.3 cm.  Normal appearance/no adnexal mass.  Other findings  No free fluid.  IMPRESSION: No acute findings.  7 mm benign appearing hyperechoic focus in the right ovary, which could represent a small corpus albicans or dermoid. Recommend follow-up by ultrasound in 6 months.   Electronically Signed   By: Myles RosenthalJohn  Stahl M.D.   On: 06/15/2014 00:19    MDM 0020 - Currently no back pain. States some lower abdominal "fullness" but not pain.   Assessment and Plan  A: 1. Other and unspecified ovarian cyst     P: Discharge in stable condition.  GC/chlamydia pending Follow up with PCP for further STI testing Follow up with PCP for worsening symptoms.  Discussed reasons to return to MAU  Melanie Vincent, Student-NP  06/15/2014, 12:48 AM   Seen also by me Agree with note Melanie Vincent, CNM

## 2014-06-14 NOTE — MAU Note (Signed)
Pt reports she has been having lower back pain for the last 2 months, [facility]. Only thing found was trich in her urine, took 7 days of flagyl. States she has continued to have pain off/on. Has been having lower abd pain also for the last few days.

## 2014-06-15 DIAGNOSIS — N83209 Unspecified ovarian cyst, unspecified side: Secondary | ICD-10-CM

## 2014-06-15 LAB — WET PREP, GENITAL
Clue Cells Wet Prep HPF POC: NONE SEEN
Trich, Wet Prep: NONE SEEN
YEAST WET PREP: NONE SEEN

## 2014-06-15 NOTE — Discharge Instructions (Signed)
Ovarian Cyst An ovarian cyst is a fluid-filled sac that forms on an ovary. The ovaries are small organs that produce eggs in women. Various types of cysts can form on the ovaries. Most are not cancerous. Many do not cause problems, and they often go away on their own. Some may cause symptoms and require treatment. Common types of ovarian cysts include:  Functional cysts--These cysts may occur every month during the menstrual cycle. This is normal. The cysts usually go away with the next menstrual cycle if the woman does not get pregnant. Usually, there are no symptoms with a functional cyst.  Endometrioma cysts--These cysts form from the tissue that lines the uterus. They are also called "chocolate cysts" because they become filled with blood that turns brown. This type of cyst can cause pain in the lower abdomen during intercourse and with your menstrual period.  Cystadenoma cysts--This type develops from the cells on the outside of the ovary. These cysts can get very big and cause lower abdomen pain and pain with intercourse. This type of cyst can twist on itself, cut off its blood supply, and cause severe pain. It can also easily rupture and cause a lot of pain.  Dermoid cysts--This type of cyst is sometimes found in both ovaries. These cysts may contain different kinds of body tissue, such as skin, teeth, hair, or cartilage. They usually do not cause symptoms unless they get very big.  Theca lutein cysts--These cysts occur when too much of a certain hormone (human chorionic gonadotropin) is produced and overstimulates the ovaries to produce an egg. This is most common after procedures used to assist with the conception of a baby (in vitro fertilization). CAUSES   Fertility drugs can cause a condition in which multiple large cysts are formed on the ovaries. This is called ovarian hyperstimulation syndrome.  A condition called polycystic ovary syndrome can cause hormonal imbalances that can lead to  nonfunctional ovarian cysts. SIGNS AND SYMPTOMS  Many ovarian cysts do not cause symptoms. If symptoms are present, they may include:  Pelvic pain or pressure.  Pain in the lower abdomen.  Pain during sexual intercourse.  Increasing girth (swelling) of the abdomen.  Abnormal menstrual periods.  Increasing pain with menstrual periods.  Stopping having menstrual periods without being pregnant. DIAGNOSIS  These cysts are commonly found during a routine or annual pelvic exam. Tests may be ordered to find out more about the cyst. These tests may include:  Ultrasound.  X-ray of the pelvis.  CT scan.  MRI.  Blood tests. TREATMENT  Many ovarian cysts go away on their own without treatment. Your health care provider may want to check your cyst regularly for 2-3 months to see if it changes. For women in menopause, it is particularly important to monitor a cyst closely because of the higher rate of ovarian cancer in menopausal women. When treatment is needed, it may include any of the following:  A procedure to drain the cyst (aspiration). This may be done using a long needle and ultrasound. It can also be done through a laparoscopic procedure. This involves using a thin, lighted tube with a tiny camera on the end (laparoscope) inserted through a small incision.  Surgery to remove the whole cyst. This may be done using laparoscopic surgery or an open surgery involving a larger incision in the lower abdomen.  Hormone treatment or birth control pills. These methods are sometimes used to help dissolve a cyst. HOME CARE INSTRUCTIONS   Only take over-the-counter   or prescription medicines as directed by your health care provider.  Follow up with your health care provider as directed.  Get regular pelvic exams and Pap tests. SEEK MEDICAL CARE IF:   Your periods are late, irregular, or painful, or they stop.  Your pelvic pain or abdominal pain does not go away.  Your abdomen becomes  larger or swollen.  You have pressure on your bladder or trouble emptying your bladder completely.  You have pain during sexual intercourse.  You have feelings of fullness, pressure, or discomfort in your stomach.  You lose weight for no apparent reason.  You feel generally ill.  You become constipated.  You lose your appetite.  You develop acne.  You have an increase in body and facial hair.  You are gaining weight, without changing your exercise and eating habits.  You think you are pregnant. SEEK IMMEDIATE MEDICAL CARE IF:   You have increasing abdominal pain.  You feel sick to your stomach (nauseous), and you throw up (vomit).  You develop a fever that comes on suddenly.  You have abdominal pain during a bowel movement.  Your menstrual periods become heavier than usual. MAKE SURE YOU:  Understand these instructions.  Will watch your condition.  Will get help right away if you are not doing well or get worse. Document Released: 11/27/2005 Document Revised: 12/02/2013 Document Reviewed: 08/04/2013 Exit[employer] Patient Information 2015 [employer]. This information is not intended to replace advice given to you by your health care provider. Make sure you discuss any questions you have with your health care provider.  

## 2014-06-15 NOTE — MAU Provider Note (Signed)
Attestation of Attending Supervision of Advanced Practitioner (CNM/NP): Evaluation and management procedures were performed by the Advanced Practitioner under my supervision and collaboration.  I have reviewed the Advanced Practitioner's note and chart, and I agree with the management and plan.  Beaumont Austad 06/15/2014 7:35 AM   

## 2014-06-16 LAB — GC/CHLAMYDIA PROBE AMP
CT Probe RNA: NEGATIVE
GC PROBE AMP APTIMA: NEGATIVE

## 2014-09-16 ENCOUNTER — Ambulatory Visit: Payer: Self-pay | Admitting: Obstetrics and Gynecology

## 2014-09-24 ENCOUNTER — Other Ambulatory Visit: Payer: Self-pay

## 2014-09-27 ENCOUNTER — Emergency Department (HOSPITAL_COMMUNITY)
Admission: EM | Admit: 2014-09-27 | Discharge: 2014-09-27 | Disposition: A | Discharge status: Home / Self Care | Payer: Self-pay | Attending: Emergency Medicine | Admitting: Emergency Medicine

## 2014-09-27 ENCOUNTER — Encounter (HOSPITAL_COMMUNITY): Payer: Self-pay | Admitting: Emergency Medicine

## 2014-09-27 DIAGNOSIS — W292XXA Contact with other powered household machinery, initial encounter: Secondary | ICD-10-CM | POA: Insufficient documentation

## 2014-09-27 DIAGNOSIS — S6991XA Unspecified injury of right wrist, hand and finger(s), initial encounter: Secondary | ICD-10-CM | POA: Insufficient documentation

## 2014-09-27 DIAGNOSIS — Z8619 Personal history of other infectious and parasitic diseases: Secondary | ICD-10-CM | POA: Insufficient documentation

## 2014-09-27 DIAGNOSIS — T754XXA Electrocution, initial encounter: Secondary | ICD-10-CM | POA: Insufficient documentation

## 2014-09-27 DIAGNOSIS — Z79899 Other long term (current) drug therapy: Secondary | ICD-10-CM | POA: Insufficient documentation

## 2014-09-27 DIAGNOSIS — K219 Gastro-esophageal reflux disease without esophagitis: Secondary | ICD-10-CM | POA: Insufficient documentation

## 2014-09-27 DIAGNOSIS — M79601 Pain in right arm: Secondary | ICD-10-CM

## 2014-09-27 DIAGNOSIS — Y9389 Activity, other specified: Secondary | ICD-10-CM | POA: Insufficient documentation

## 2014-09-27 DIAGNOSIS — R202 Paresthesia of skin: Secondary | ICD-10-CM

## 2014-09-27 DIAGNOSIS — Z8709 Personal history of other diseases of the respiratory system: Secondary | ICD-10-CM | POA: Insufficient documentation

## 2014-09-27 DIAGNOSIS — R209 Unspecified disturbances of skin sensation: Secondary | ICD-10-CM | POA: Insufficient documentation

## 2014-09-27 DIAGNOSIS — Z87891 Personal history of nicotine dependence: Secondary | ICD-10-CM | POA: Insufficient documentation

## 2014-09-27 DIAGNOSIS — Y9289 Other specified places as the place of occurrence of the external cause: Secondary | ICD-10-CM | POA: Insufficient documentation

## 2014-09-27 DIAGNOSIS — Z8742 Personal history of other diseases of the female genital tract: Secondary | ICD-10-CM | POA: Insufficient documentation

## 2014-09-27 MED ORDER — IBUPROFEN 400 MG PO TABS
800.0000 mg | ORAL_TABLET | Freq: Once | ORAL | Status: AC
  Administered 2014-09-27: 800 mg via ORAL
  Filled 2014-09-27: qty 2

## 2014-09-27 MED ORDER — HYDROCODONE-ACETAMINOPHEN 5-325 MG PO TABS: 1.0000 | ORAL_TABLET | ORAL | Status: DC | PRN

## 2014-09-27 MED ORDER — IBUPROFEN 800 MG PO TABS: 800.0000 mg | ORAL_TABLET | Freq: Three times a day (TID) | ORAL | Status: DC | PRN

## 2014-09-27 NOTE — ED Notes (Signed)
Pt. Stated, i was messing with the handle on the dryer and it shocked my rt. Hand.  It burns and feels numb half up my rt. Arm.

## 2014-09-27 NOTE — ED Provider Notes (Signed)
CSN: [id number]     Arrival date & time 09/27/14  1310 History  This chart was scribed for Trixie DredgeEmily Chrishawna Farina, PA-C, working with Glynn OctaveStephen Rancour, MD by Chestine SporeSoijett Blue, ED Scribe. The patient was seen in room TR07C/TR07C at 3:00 PM.    Chief Complaint  Patient presents with  . Hand Injury    The history is provided by the patient. No language interpreter was used.   Melanie Vincent is a 38 y.o. female who presents today complaining of right hand injury onset PTA. She states that she was drying clothes and she went to start another load and got "electrocuted" when she touched the dryer. she states that when it initially happened she fell backwards. She states that she got stuck on the metal of the dryer for 7-10 seconds. She states that she is having associated symptoms of numbness, tingling, aching in her right arm.  Had episode of chest tightness that lasted seconds to minutes while on the ambulance.  No chest pain currently.  She rates her pain as 7/10. She states that she has not taken any medications for her symptoms but notes that the symptoms are gradually improving. She denies SOB, nausea, dizziness, LOC, and any other symptoms. She states that she is right handed.   Past Medical History  Diagnosis Date  . GERD (gastroesophageal reflux disease)   . H pylori ulcer   . Breast lump     Mammogram (last 2013)  . Sinusitis   . Herpes simplex labialis   . Palpitations 06/14/2012   Past Surgical History  Procedure Laterality Date  . Cesarean section    . Tubal ligation      2008   Family History  Problem Relation Age of Onset  . Diabetes Father    History  Substance Use Topics  . Smoking status: Former Smoker    Quit date: 06/15/2007  . Smokeless tobacco: Not on file  . Alcohol Use: Yes     Comment: Occasional use   OB History   Grav Para Term Preterm Abortions TAB SAB Ect Mult Living   6 4 4  2 2   1 5      Obstetric Comments   Twins c-section in 2008     Review of Systems   Respiratory: Positive for chest tightness. Negative for shortness of breath.   Cardiovascular: Negative for chest pain.  Gastrointestinal: Negative for nausea.  Neurological: Positive for numbness. Negative for dizziness and syncope.  Psychiatric/Behavioral: Positive for confusion.  All other systems reviewed and are negative.  Allergies  Tramadol  Home Medications   Prior to Admission medications   Medication Sig Start Date End Date Taking? Authorizing Provider  Omeprazole Magnesium (PRILOSEC OTC PO) Take 1 capsule by mouth daily as needed.    Historical Provider, MD   BP 120/76  Pulse 87  Temp(Src) 97.9 F (36.6 C) (Oral)  Resp 18  Ht 5' 6.5" (1.689 m)  Wt 219 lb 4 oz (99.451 kg)  BMI 34.86 kg/m2  SpO2 98%  Physical Exam  Nursing note and vitals reviewed. Constitutional: She appears well-developed and well-nourished. No distress.  HENT:  Head: Normocephalic and atraumatic.  Neck: Neck supple.  Cardiovascular: Normal rate, regular rhythm, normal heart sounds and intact distal pulses.   Pulmonary/Chest: Effort normal and breath sounds normal. No respiratory distress. She has no wheezes. She has no rales.  Musculoskeletal: Normal range of motion. She exhibits no edema and no tenderness.  Upper extremities Strength 5/5.   Neurological:  She is alert.  Decreased sensation throughout the RUE.  Skin: She is not diaphoretic.  No break in skin. No burn marks.   Psychiatric: She has a normal mood and affect. Her behavior is normal.    ED Course  Procedures (including critical care time) DIAGNOSTIC STUDIES: Oxygen Saturation is 98% on room air, normal by my interpretation.    COORDINATION OF CARE: 3:06 PM-Discussed treatment plan which includes Advil with pt at bedside and pt agreed to plan.   Labs Review Labs Reviewed - No data to display  Imaging Review No results found.   EKG Interpretation None       Date: 09/27/2014  Rate: 66  Rhythm: normal sinus rhythm  and sinus arrhythmia  QRS Axis: normal  Intervals: normal  ST/T Wave abnormalities: normal  Conduction Disutrbances:none  Narrative Interpretation:   Old EKG Reviewed: none available    3:28 PM Discussed pt with Dr Manus Gunningancour.   MDM   Final diagnoses:  Right arm pain  Arm paresthesia, right  Electrocution    Afebrile, nontoxic patient with right arm pain and diffuse paresthesia after being "electrocuted" by dryer at home.  No burn marks and no breaks in skin.  Strength 5/5 and full AROM of RUE.  Had short episode chest tightness while on ambulance, no other associated symptoms.  EKG is sinus rhythm with arrhythmia.  RUE Symptoms improving without treatment in ED.   D/C home with ibuprofen, norco, close PCP follow up.  Discussed result, findings, treatment, and follow up  with patient.  Pt given return precautions.  Pt verbalizes understanding and agrees with plan.       I personally performed the services described in this documentation, which was scribed in my presence. The recorded information has been reviewed and is accurate.    Trixie Dredgemily Rio Kidane, PA-C 09/27/14 [phone]

## 2014-09-27 NOTE — ED Notes (Signed)
PT reports numbness to all fingers on RT . Pt has full range of motion on RT hand.

## 2014-09-27 NOTE — ED Notes (Signed)
EMS stated, she was moving the control panel and it shocked her.  C/o right hand burning.

## 2014-09-27 NOTE — Discharge Instructions (Signed)
Read the information below.  Use the prescribed medication as directed.  Please discuss all new medications with your pharmacist.  Do not take additional tylenol while taking the prescribed pain medication to avoid overdose.  You may return to the Emergency Department at any time for worsening condition or any new symptoms that concern you.  If there is any possibility that you might be pregnant, please let your health care provider know and discuss this with the pharmacist to ensure medication safety.     Electric Shock Injury Electric shock injuries may be caused by lightning or electricity (current) passing through the body. The amount of injury depends on the current's pressure (voltage), the amount of current (amperage), the type of current (direct vs. alternating), the body's resistance to the current, the current's path through the body, and how long the body remains in contact with the current. Current is the flow of electricity. Electricity may produce effects ranging from barely noticeable tingling to instant death; every part of the body is vulnerable.  The harshness of injury depends mostly on the voltage. Low voltage can be as dangerous as high voltage under the right circumstances. People have been killed by shocks of just 50 volts. WHAT DETERMINES THE EFFECTS OF ELECTRICITY? How electric shocks affect the skin is determined by the skin's resistance. This is the skin's ability to stay unharmed by a shock. This, in turn, depends upon the wetness, dryness, thickness and or cleanliness of the skin. Thin or wet skin is much less resistant than thick or dry skin. When skin resistance is low, the current may cause little or no skin damage but may severely burn internal organs and tissues. Conversely, high skin resistance, such as with dry thick skin, can produce severe skin burns but decreases the current entering the body. WHAT PARTS OF THE BODY DOES ELECTRICITY AFFECT THE MOST?  The nervous  system (the brain, spinal cord, and nerves) are most helpless to the effects of electricity and most often harmed in electrical injury. Some damage is minor and clears up on its own or with treatment. Sometimes the damage is severe and will be permanent. Neurological problems may be apparent immediately after the accident, or gradually develop over a period of up to three years.  Damage to the respiratory and cardiovascular systems happens immediately. Electric shocks can paralyze the respiratory system (stop breathing) or disrupt heart action (cause the heart to beat irregularly or stop). This may cause instant death. Smaller veins and arteries, which get hot more easily than the larger blood vessels, are at greater risk. They can develop blood clots. Damage to the smaller vessels is a common cause of amputation following high-voltage injuries.  Other injuries may include cataracts, kidney failure, and injury to muscle tissue. An electric arc may set clothing and flammable substances on fire which may cause burns. Strong shocks are often accompanied by violent muscle spasms that can break and dislocate bones. These spasms can also freeze the victim in place and prevent him or her from breaking away from the current. DIAGNOSIS  Diagnosis relies on information about the cause of the accident, physical examination, and close monitoring of the heart, lungs, neurological condition and kidney activity. These conditions can change rapidly so close observation is necessary. Magnetic resonance imaging (MRI) may be necessary to check for brain injury. TREATMENT   When an electrical accident happens at home or in the workplace, emergency medical help should be summoned as quickly as possible. The main power should  immediately be shut off. If that cannot be done, and current is still flowing through the victim, stand on a dry, non-conducting surface such as a folded newspaper, flattened cardboard carton, or plastic or  rubber mat. Use a non-conducting object such as a wooden broomstick (never a damp or metallic object) to push the victim away from the source of the current. Non-conducting means the substance will not pass electricity easily through it. Do not touch the victim or electrical source while the current is still flowing. This may electrocute the rescuer.  If the victim is faint, pale, or showing signs of shock, lay the victim down, with the legs elevated above the level of the chest. Warm the person with a blanket.  If a pulse can not be felt, or the person is not breathing, someone trained in cardiopulmonary resuscitation (CPR) should begin CPR. Continue this until help arrives.  If the victim is burned, remove clothing that comes off easily. Rinse the burned area in cool water for pain relief. Give first aid for burns. Burns often require treatment at a burn center.  Electrical injury can be associated with explosions or falls that can cause other injuries. Avoid moving the head or neck if an injury to this area is suspected.  Give first aid as needed for other wounds or fractures.  Fluid replacement therapy is necessary to restore lost fluids and electrolytes. Severely injured tissue is repaired surgically.  Antibiotics and antibacterial creams are used to prevent infection.  Kidney failure may need to be treated.  Physical therapy may help recovery along with counseling if there is disfigurement. PROGNOSIS   Electric shocks may cause death.  Survivors may require amputation. Cosmetic problems may result along with disfigurement.  Injuries from household appliances and other low-voltage sources are less likely to produce extreme damage. PREVENTION   Know electrical dangers in your home.  Damaged electric appliances, wiring, cords, and plugs should be repaired or replaced. Electrical repairs should be attempted only by people with the proper training.  Hair dryers, radios, and other  electric appliances should never be used in the bathroom or anywhere else they might accidentally come in contact with water. Water and pipes create a ground and the electricity picks the easiest way to go to ground which can be through your body.  Young children need to be kept away from electric appliances and should be taught about the dangers of electricity as soon as they are old enough.  Electric outlets require safety covers in homes with young children.  During lightning and thunder storms, go indoors immediately, even if no rain is falling. Boaters should return to shore as rapidly as possible.  If the hair on your head or arms stands on end during a storm, seek cover as rapidly as possible as a lightning strike may be about to happen.  If you cannot reach indoor shelter, stay away from metallic objects such as golf clubs or fishing rods and lie down in low-ground areas. Standing or lying under or next to tall or metallic structures is unsafe. For example, it is unsafe to stand under a tree during a lightning storm. Do not stand next to long conductors of electricity such as wire fences.  An automobile is appropriate cover, as long as the radio is off.  Telephones, computers, hair dryers, and other appliances that can act as channels for lightning should not be used during a thunder storm.  During storms, stay away from screens and metal that  may conduct electricity from the outside. SEEK IMMEDIATE MEDICAL CARE IF:  You develop chest pain.  A part of your arms or legs becomes very swollen or painful.  One of your arms or legs appears pale, cool, or discolored.  Your urine becomes discolored, or you are not urinating as much as usual.  You develop severe abdominal pain. Document Released: 11/30/2003 Document Revised: 02/19/2012 Document Reviewed: 02/23/2014 [facility] Patient Information 2015 [place], [place]. This information is not intended to replace advice given to you by your  health care provider. Make sure you discuss any questions you have with your health care provider.

## 2014-09-28 NOTE — ED Provider Notes (Addendum)
Medical screening examination/treatment/procedure(s) were performed by non-physician practitioner and as supervising physician I was immediately available for consultation/collaboration.   EKG Interpretation   Date/Time:    Ventricular Rate:    PR Interval:    QRS Duration:   QT Interval:    QTC Calculation:   R Axis:     Text Interpretation:            Glynn OctaveStephen Jamie Belger, MD 09/28/14 [id number]  Glynn OctaveStephen Terrion Gencarelli, MD 10/13/14 1124

## 2014-09-30 ENCOUNTER — Other Ambulatory Visit: Payer: Self-pay | Admitting: Family Medicine

## 2014-10-12 ENCOUNTER — Encounter (HOSPITAL_COMMUNITY): Payer: Self-pay | Admitting: Emergency Medicine

## 2014-10-29 ENCOUNTER — Ambulatory Visit: Payer: Self-pay

## 2014-11-11 ENCOUNTER — Emergency Department (INDEPENDENT_AMBULATORY_CARE_PROVIDER_SITE_OTHER)
Admission: EM | Admit: 2014-11-11 | Discharge: 2014-11-11 | Disposition: A | Discharge status: Home / Self Care | Payer: Self-pay | Source: Home / Self Care | Attending: Emergency Medicine | Admitting: Emergency Medicine

## 2014-11-11 ENCOUNTER — Encounter (HOSPITAL_COMMUNITY): Payer: Self-pay | Admitting: Emergency Medicine

## 2014-11-11 ENCOUNTER — Ambulatory Visit: Payer: Self-pay | Admitting: Family Medicine

## 2014-11-11 DIAGNOSIS — F419 Anxiety disorder, unspecified: Secondary | ICD-10-CM

## 2014-11-11 DIAGNOSIS — J0101 Acute recurrent maxillary sinusitis: Secondary | ICD-10-CM

## 2014-11-11 MED ORDER — PREDNISONE 20 MG PO TABS
ORAL_TABLET | ORAL | Status: DC
Start: 2014-11-11 — End: 2014-11-26

## 2014-11-11 NOTE — ED Provider Notes (Signed)
CSN: [id number]     Arrival date & time 11/11/14  1430 History   First MD Initiated Contact with Patient 11/11/14 1504     Chief Complaint  Patient presents with  . Headache   (Consider location/radiation/quality/duration/timing/severity/associated sxs/prior Treatment) HPI Comments: 38 year old female presents stating that she had a sore throat a few weeks ago. This was followed by a strange sensation to the anterior neck is in there may been some drainage. One week ago she developed a headache located behind the left eye and the bilateral maxillary sinuses. She also has some body aches, occasional dizziness and jitteriness. She states that she is under a lot of stress and feels nervous. She is taking no medications for symptoms. Child an appointment with her PCP this morning but missed it due to having to take care of one of her children.   Past Medical History  Diagnosis Date  . GERD (gastroesophageal reflux disease)   . H pylori ulcer   . Breast lump     Mammogram (last 2013)  . Sinusitis   . Herpes simplex labialis   . Palpitations 06/14/2012   Past Surgical History  Procedure Laterality Date  . Cesarean section    . Tubal ligation      2008   Family History  Problem Relation Age of Onset  . Diabetes Father    History  Substance Use Topics  . Smoking status: Former Smoker    Quit date: 06/15/2007  . Smokeless tobacco: Not on file  . Alcohol Use: Yes     Comment: Occasional use   OB History    Gravida Para Term Preterm AB TAB SAB Ectopic Multiple Living   6 4 4  2 2   1 5       Obstetric Comments   Twins c-section in 2008     Review of Systems  Constitutional: Positive for activity change. Negative for fever and chills.  HENT: Positive for congestion, postnasal drip and sore throat. Negative for ear pain, hearing loss and rhinorrhea.   Eyes: Negative for visual disturbance.  Respiratory: Negative for cough and shortness of breath.   Cardiovascular: Negative for  chest pain and leg swelling.  Gastrointestinal: Negative.   Musculoskeletal: Negative.   Neurological: Positive for dizziness and headaches. Negative for tremors, seizures, syncope, speech difficulty, weakness and numbness.  Psychiatric/Behavioral: The patient is nervous/anxious.     Allergies  Tramadol  Home Medications   Prior to Admission medications   Medication Sig Start Date End Date Taking? Authorizing Provider  HYDROcodone-acetaminophen (NORCO/VICODIN) 5-325 MG per tablet Take 1 tablet by mouth every 4 (four) hours as needed for moderate pain or severe pain. 09/27/14   Trixie DredgeEmily West, PA-C  ibuprofen (ADVIL,MOTRIN) 800 MG tablet Take 1 tablet (800 mg total) by mouth every 8 (eight) hours as needed for mild pain or moderate pain. 09/27/14   Trixie DredgeEmily West, PA-C  predniSONE (DELTASONE) 20 MG tablet Take 3 tabs po on first day, 2 tabs second day, 2 tabs third day, 1 tab fourth day, 1 tab 5th day. Take with food. 11/11/14   Hayden Rasmussenavid Cesar Rogerson, NP  valACYclovir (VALTREX) 1000 MG tablet TAKE ONE TABLET BY MOUTH EVERY 12 HOURS FOR 2 DAYS 10/01/14   Pincus LargeJazma Y Phelps, DO   BP 132/87 mmHg  Pulse 77  Temp(Src) 97.2 F (36.2 C) (Oral)  Resp 16  SpO2 98% Physical Exam  Constitutional: She is oriented to person, place, and time. She appears well-developed and well-nourished. No distress.  HENT:  Head: Normocephalic and atraumatic.  Mouth/Throat: No oropharyngeal exudate.  Right TM retracted. Left TM is normal Oropharynx with minor erythema, cobblestoning and scant clear PND.  Eyes: Conjunctivae and EOM are normal.  Neck: Normal range of motion. Neck supple. No tracheal deviation present. No thyromegaly present.  Cardiovascular: Normal rate, regular rhythm and intact distal pulses.   Murmur heard. Pulmonary/Chest: Effort normal and breath sounds normal. No respiratory distress. She has no wheezes. She has no rales.  Musculoskeletal: She exhibits no edema.  Lymphadenopathy:    She has no cervical  adenopathy.  Neurological: She is alert and oriented to person, place, and time. No cranial nerve deficit. She exhibits normal muscle tone.  Skin: Skin is warm and dry.  Psychiatric: She has a normal mood and affect. Her behavior is normal. Thought content normal.  Nursing note and vitals reviewed.   ED Course  Procedures (including critical care time) Labs Review Labs Reviewed - No data to display  Imaging Review No results found.   MDM   1. Acute recurrent maxillary sinusitis   2. Anxiety    Prednisone taper Sudafed PE 10 mg  claritin Tylenol Call PCP for re-appt     Hayden Rasmussenavid Sherrika Weakland, NP 11/11/14 1533

## 2014-11-11 NOTE — Discharge Instructions (Signed)

## 2014-11-11 NOTE — ED Notes (Signed)
C/o feeling "sick" onset 2 weeks Sx include: HA, weakness, feeling "foggy"  Alert, no signs of acute distress.

## 2014-11-12 ENCOUNTER — Ambulatory Visit: Payer: Self-pay | Admitting: Family Medicine

## 2014-11-25 ENCOUNTER — Emergency Department (HOSPITAL_COMMUNITY): Payer: Medicaid Other

## 2014-11-25 ENCOUNTER — Observation Stay (HOSPITAL_COMMUNITY)
Admission: EM | Admit: 2014-11-25 | Discharge: 2014-11-26 | Disposition: A | Discharge status: Home / Self Care | Payer: Medicaid Other | Attending: Emergency Medicine | Admitting: Emergency Medicine

## 2014-11-25 ENCOUNTER — Encounter (HOSPITAL_COMMUNITY): Payer: Self-pay | Admitting: Emergency Medicine

## 2014-11-25 DIAGNOSIS — J329 Chronic sinusitis, unspecified: Secondary | ICD-10-CM | POA: Insufficient documentation

## 2014-11-25 DIAGNOSIS — K219 Gastro-esophageal reflux disease without esophagitis: Secondary | ICD-10-CM | POA: Insufficient documentation

## 2014-11-25 DIAGNOSIS — Z87891 Personal history of nicotine dependence: Secondary | ICD-10-CM | POA: Insufficient documentation

## 2014-11-25 DIAGNOSIS — R079 Chest pain, unspecified: Secondary | ICD-10-CM | POA: Diagnosis not present

## 2014-11-25 DIAGNOSIS — Z8619 Personal history of other infectious and parasitic diseases: Secondary | ICD-10-CM | POA: Insufficient documentation

## 2014-11-25 DIAGNOSIS — Z872 Personal history of diseases of the skin and subcutaneous tissue: Secondary | ICD-10-CM | POA: Diagnosis not present

## 2014-11-25 DIAGNOSIS — R0981 Nasal congestion: Secondary | ICD-10-CM | POA: Insufficient documentation

## 2014-11-25 DIAGNOSIS — Z23 Encounter for immunization: Secondary | ICD-10-CM | POA: Insufficient documentation

## 2014-11-25 DIAGNOSIS — R002 Palpitations: Secondary | ICD-10-CM | POA: Diagnosis present

## 2014-11-25 DIAGNOSIS — N63 Unspecified lump in breast: Secondary | ICD-10-CM | POA: Diagnosis not present

## 2014-11-25 DIAGNOSIS — I480 Paroxysmal atrial fibrillation: Secondary | ICD-10-CM | POA: Diagnosis present

## 2014-11-25 HISTORY — DX: Anxiety disorder, unspecified: F41.9

## 2014-11-25 HISTORY — DX: Cardiac arrhythmia, unspecified: I49.9

## 2014-11-25 LAB — I-STAT CHEM 8, ED
BUN: 17 mg/dL (ref 6–23)
CREATININE: 1 mg/dL (ref 0.50–1.10)
Calcium, Ion: 1.09 mmol/L — ABNORMAL LOW (ref 1.12–1.23)
Chloride: 108 mEq/L (ref 96–112)
Glucose, Bld: 99 mg/dL (ref 70–99)
HCT: 44 % (ref 36.0–46.0)
Hemoglobin: 15 g/dL (ref 12.0–15.0)
POTASSIUM: 6.1 meq/L — AB (ref 3.7–5.3)
SODIUM: 136 meq/L — AB (ref 137–147)
TCO2: 22 mmol/L (ref 0–100)

## 2014-11-25 LAB — BASIC METABOLIC PANEL
Anion gap: 11 (ref 5–15)
BUN: 12 mg/dL (ref 6–23)
CALCIUM: 9 mg/dL (ref 8.4–10.5)
CO2: 21 mEq/L (ref 19–32)
CREATININE: 0.81 mg/dL (ref 0.50–1.10)
Chloride: 102 mEq/L (ref 96–112)
GFR calc Af Amer: >90 mL/min (ref >90)
GLUCOSE: 101 mg/dL — AB (ref 70–99)
Potassium: 6.3 mEq/L — ABNORMAL HIGH (ref 3.7–5.3)
SODIUM: 134 meq/L — AB (ref 137–147)

## 2014-11-25 LAB — CBC WITH DIFFERENTIAL/PLATELET
Basophils Absolute: 0 10*3/uL (ref 0.0–0.1)
Basophils Relative: 0 % (ref 0–1)
EOS ABS: 0.1 10*3/uL (ref 0.0–0.7)
Eosinophils Relative: 2 % (ref 0–5)
HCT: 40.5 % (ref 36.0–46.0)
Hemoglobin: 13.6 g/dL (ref 12.0–15.0)
Lymphocytes Relative: 37 % (ref 12–46)
Lymphs Abs: 3.2 10*3/uL (ref 0.7–4.0)
MCH: 30.2 pg (ref 26.0–34.0)
MCHC: 33.6 g/dL (ref 30.0–36.0)
MCV: 90 fL (ref 78.0–100.0)
Monocytes Absolute: 0.5 10*3/uL (ref 0.1–1.0)
Monocytes Relative: 6 % (ref 3–12)
Neutro Abs: 4.7 10*3/uL (ref 1.7–7.7)
Neutrophils Relative %: 55 % (ref 43–77)
PLATELETS: 415 10*3/uL — AB (ref 150–400)
RBC: 4.5 MIL/uL (ref 3.87–5.11)
RDW: 13.8 % (ref 11.5–15.5)
WBC: 8.5 10*3/uL (ref 4.0–10.5)

## 2014-11-25 LAB — RAPID URINE DRUG SCREEN, HOSP PERFORMED
AMPHETAMINES: NOT DETECTED
Barbiturates: NOT DETECTED
Benzodiazepines: NOT DETECTED
COCAINE: NOT DETECTED
Opiates: NOT DETECTED
TETRAHYDROCANNABINOL: POSITIVE — AB

## 2014-11-25 LAB — POC URINE PREG, ED: Preg Test, Ur: NEGATIVE

## 2014-11-25 LAB — URINALYSIS, ROUTINE W REFLEX MICROSCOPIC
BILIRUBIN URINE: NEGATIVE
GLUCOSE, UA: NEGATIVE mg/dL
Ketones, ur: NEGATIVE mg/dL
Leukocytes, UA: NEGATIVE
Nitrite: NEGATIVE
Protein, ur: NEGATIVE mg/dL
SPECIFIC GRAVITY, URINE: 1.006 (ref 1.005–1.030)
Urobilinogen, UA: 0.2 mg/dL (ref 0.0–1.0)
pH: 6 (ref 5.0–8.0)

## 2014-11-25 LAB — URINE MICROSCOPIC-ADD ON: RBC / HPF: NONE SEEN RBC/hpf (ref <3)

## 2014-11-25 LAB — I-STAT TROPONIN, ED: TROPONIN I, POC: 0 ng/mL (ref 0.00–0.08)

## 2014-11-25 LAB — POTASSIUM: Potassium: 4.2 mEq/L (ref 3.7–5.3)

## 2014-11-25 LAB — TSH: TSH: 0.976 u[IU]/mL (ref 0.350–4.500)

## 2014-11-25 MED ORDER — NITROGLYCERIN 0.4 MG SL SUBL
0.4000 mg | SUBLINGUAL_TABLET | SUBLINGUAL | Status: DC | PRN
  Administered 2014-11-25: 0.4 mg via SUBLINGUAL
  Filled 2014-11-25: qty 1

## 2014-11-25 MED ORDER — ASPIRIN 81 MG PO CHEW
324.0000 mg | CHEWABLE_TABLET | Freq: Once | ORAL | Status: AC
  Administered 2014-11-25: 324 mg via ORAL
  Filled 2014-11-25: qty 4

## 2014-11-25 MED ORDER — ALPRAZOLAM 0.25 MG PO TABS
0.2500 mg | ORAL_TABLET | Freq: Two times a day (BID) | ORAL | Status: DC | PRN
  Administered 2014-11-25 – 2014-11-26 (×2): 0.25 mg via ORAL
  Filled 2014-11-25 (×3): qty 1

## 2014-11-25 MED ORDER — PANTOPRAZOLE SODIUM 40 MG PO TBEC
40.0000 mg | DELAYED_RELEASE_TABLET | Freq: Every day | ORAL | Status: DC
  Administered 2014-11-25 – 2014-11-26 (×2): 40 mg via ORAL
  Filled 2014-11-25 (×2): qty 1

## 2014-11-25 MED ORDER — HYDROCODONE-ACETAMINOPHEN 5-325 MG PO TABS
1.0000 | ORAL_TABLET | ORAL | Status: DC | PRN
  Administered 2014-11-25 (×2): 1 via ORAL
  Filled 2014-11-25 (×2): qty 1

## 2014-11-25 MED ORDER — ZOLPIDEM TARTRATE 5 MG PO TABS
5.0000 mg | ORAL_TABLET | Freq: Every evening | ORAL | Status: DC | PRN
  Administered 2014-11-25: 5 mg via ORAL
  Filled 2014-11-25: qty 1

## 2014-11-25 MED ORDER — ONDANSETRON HCL 4 MG/2ML IJ SOLN
4.0000 mg | Freq: Four times a day (QID) | INTRAMUSCULAR | Status: DC | PRN
  Administered 2014-11-25: 4 mg via INTRAVENOUS
  Filled 2014-11-25: qty 2

## 2014-11-25 MED ORDER — METOPROLOL TARTRATE 25 MG PO TABS
25.0000 mg | ORAL_TABLET | Freq: Two times a day (BID) | ORAL | Status: DC
  Administered 2014-11-25 – 2014-11-26 (×3): 25 mg via ORAL
  Filled 2014-11-25 (×3): qty 1

## 2014-11-25 MED ORDER — DILTIAZEM HCL 100 MG IV SOLR
5.0000 mg/h | INTRAVENOUS | Status: DC
  Administered 2014-11-25: 5 mg/h via INTRAVENOUS

## 2014-11-25 MED ORDER — DILTIAZEM LOAD VIA INFUSION
10.0000 mg | Freq: Once | INTRAVENOUS | Status: AC
  Administered 2014-11-25: 10 mg via INTRAVENOUS
  Filled 2014-11-25: qty 10

## 2014-11-25 MED ORDER — DILTIAZEM HCL 100 MG IV SOLR
5.0000 mg/h | INTRAVENOUS | Status: DC
  Administered 2014-11-26: 5 mg/h via INTRAVENOUS

## 2014-11-25 MED ORDER — RIVAROXABAN 20 MG PO TABS
20.0000 mg | ORAL_TABLET | Freq: Every day | ORAL | Status: DC
  Administered 2014-11-25: 20 mg via ORAL
  Filled 2014-11-25: qty 1

## 2014-11-25 MED ORDER — ACETAMINOPHEN 325 MG PO TABS
650.0000 mg | ORAL_TABLET | ORAL | Status: DC | PRN
Start: 2014-11-25 — End: 2014-11-26
  Administered 2014-11-25: 650 mg via ORAL
  Filled 2014-11-25: qty 2

## 2014-11-25 MED ORDER — DILTIAZEM LOAD VIA INFUSION
10.0000 mg | Freq: Once | INTRAVENOUS | Status: AC
  Administered 2014-11-25: 20 mg via INTRAVENOUS
  Filled 2014-11-25: qty 10

## 2014-11-25 MED ORDER — INFLUENZA VAC SPLIT QUAD 0.5 ML IM SUSY
0.5000 mL | PREFILLED_SYRINGE | INTRAMUSCULAR | Status: AC
  Administered 2014-11-26: 0.5 mL via INTRAMUSCULAR
  Filled 2014-11-25: qty 0.5

## 2014-11-25 NOTE — H&P (Signed)
ADMISSION HISTORY AND PHYSICAL   Date: 11/25/2014               Patient Name:  Melanie Vincent MRN: [id number]  DOB: 1976/06/07 Age / Sex: 38 y.o., female        PCP: No primary care provider on file. Primary Cardiologist: Hochrein         History of Present Illness: Patient is a 38 y.o. female with a PMHx of palpitations , [facility] evaluation of  Atrial fibrillation .   Pt presents with palpitations. Started this am. Woke up at 5 AM with palpitations.   No CP or dyspnea associated with the Afib  She has had some vague CP over the past 2 weeks.  Not related to activity.   Associated signs and symptoms:  Some jaw pain and a head ache recently.  Modifying factors:  Nothing seems to make it better or worse.   Non smoker Rare ETOH  Fhx:  Mother with MVP,   Medications: Outpatient medications:  (Not in a hospital admission)  Allergies  Allergen Reactions  . Tramadol Other (See Comments)    Pt states that this medication causes insomnia and for her to have "crazy thoughts".      Past Medical History  Diagnosis Date  . GERD (gastroesophageal reflux disease)   . H pylori ulcer   . Breast lump     Mammogram (last 2013)  . Sinusitis   . Herpes simplex labialis   . Palpitations 06/14/2012    Past Surgical History  Procedure Laterality Date  . Cesarean section    . Tubal ligation      2008    Family History  Problem Relation Age of Onset  . Diabetes Father     Social History:  reports that she quit smoking about 7 years ago. She does not have any smokeless tobacco history on file. She reports that she drinks alcohol. She reports that she uses illicit drugs (Marijuana).   Review of Systems: Constitutional:  denies fever, chills, diaphoresis, appetite change and fatigue.  HEENT: denies photophobia, eye pain, redness, hearing loss, ear pain, congestion, sore throat, rhinorrhea, sneezing, neck pain, neck stiffness and  tinnitus.  Respiratory: denies SOB, DOE, cough, chest tightness, and wheezing.  Cardiovascular: admits to chest pain,  + palpitations , no  leg swelling.  Gastrointestinal: denies nausea, vomiting, abdominal pain, diarrhea, constipation, has some GERD .  Genitourinary: denies dysuria, urgency, frequency, hematuria, flank pain and difficulty urinating.  Musculoskeletal: denies  Myalgias, occasional  back pain,     Skin: denies pallor, rash and wound.  Neurological: denies dizziness, seizures, syncope, weakness, light-headedness, numbness and headaches.   Hematological: denies adenopathy, easy bruising, personal or family bleeding history.  Psychiatric/ Behavioral: denies suicidal ideation, mood changes, confusion, nervousness, sleep disturbance and agitation.     Physical Exam: BP 113/80 mmHg  Pulse 93  Temp(Src) 97.4 F (36.3 C) (Oral)  Resp 20  Ht 5' 6.5" (1.689 m)  Wt 215 lb (97.523 kg)  BMI 34.19 kg/m2  SpO2 100%  Wt Readings from Last 3 Encounters:  11/25/14 215 lb (97.523 kg)  09/27/14 219 lb 4 oz (99.451 kg)  06/14/14 212 lb (96.163 kg)    General: Vital signs reviewed and noted. Well-developed, well-nourished, in no acute distress; alert,   Head: Normocephalic, atraumatic, sclera anicteric, mucus membranes are moist   Neck: Supple. Negative for carotid bruits. JVD not elevated.  Lungs:  Clear bilaterally to auscultation without wheezes, rales, or rhonchi. Breathing is normal   Heart: Irreg. Irreg.   Abdomen:  Soft, non-tender, non-distended with normoactive bowel sounds. No hepatomegaly. No rebound/guarding. No obvious abdominal masses   MSK: Strength and the appear normal for age.   Extremities: No clubbing or cyanosis. No edema.  Distal pedal pulses are 2+ and equal bilaterally .  Neurologic: Alert and oriented X 3. Moves all extremities spontaneously   Psych:  normal     Lab results: Basic Metabolic Panel:  Recent Labs Lab 11/25/14 0736 11/25/14 0848  NA  134* 136*  K 6.3* 6.1*  CL 102 108  CO2 21  --   GLUCOSE 101* 99  BUN 12 17  CREATININE 0.81 1.00  CALCIUM 9.0  --     Liver Function Tests: No results for input(s): AST, ALT, ALKPHOS, BILITOT, PROT, ALBUMIN in the last 168 hours. No results for input(s): LIPASE, AMYLASE in the last 168 hours.  CBC:  Recent Labs Lab 11/25/14 0736 11/25/14 0848  WBC 8.5  --   NEUTROABS 4.7  --   HGB 13.6 15.0  HCT 40.5 44.0  MCV 90.0  --   PLT 415*  --     Cardiac Enzymes: No results for input(s): CKTOTAL, CKMB, CKMBINDEX, TROPONINI in the last 168 hours.  BNP: Invalid input(s): POCBNP  CBG: No results for input(s): GLUCAP in the last 168 hours.  Coagulation Studies: No results for input(s): LABPROT, INR in the last 72 hours.   Other results:  EKG atrial fib        Assessment & Plan:  1. Atrial fib: Patient presents with new dx of atrial fib.  She has had palpitations for years but has never been diagnosed with Afib. Although she started feeling the Afib this am, we cannot be certain that she has had afib only for a few hours.   I would start her on a Dilt drip. Start Xarelto 20 mg a day ( with lunch)  I would start Metoprolol 25 BID.  I anticipate that she can be discharged later in the afternoon.  She may convert with the Dilt drip.   Her HR is fairly well controlled and she is in no distress at present She works and is also a Theatre stage managernursing student.   She is missig a final exam and will need a note to excuse her from the exam with a request for it  To be rescheduled   She should follow up with Dr Antoine PocheHochrein in the office.   Vesta MixerPhilip J. Aleayah Chico, Montez HagemanJr., MD, [facility]ACC 11/25/2014, 9:14 AM

## 2014-11-25 NOTE — Discharge Instructions (Signed)

## 2014-11-25 NOTE — Progress Notes (Signed)
[facility] Eligibility Specialist  Follow up appointment made with pts pcp [facility] for Tuesday Dec 22,2015 at 3:30pm. Pt verbalized understanding of this appointment. Patient will be also be contacted by an [employer] navigator to assit with re-enrolling for  insurance that has expired. [employer] card application and my contact information also provided for any future questions or concerns. No other needs identified at this time.

## 2014-11-25 NOTE — ED Provider Notes (Signed)
CSN: [id number]     Arrival date & time 11/25/14  4098 History   First MD Initiated Contact with Patient 11/25/14 0720     Chief Complaint  Patient presents with  . Palpitations  . Chest Pain     (Consider location/radiation/quality/duration/timing/severity/associated sxs/prior Treatment) HPI Comments: Patient presents to the emergency department with chief complaint of heart palpitations and chest pain. She states that the symptoms started this morning when she woke. She states they're worsened with exertion. She denies any associated shortness of breath. Denies any radiating symptoms. She states that she has had an episode similar to this in the past and was followed by low back are cardiology and wore a Holter monitor. No treatment was started. This was several years ago. Patient states that she has done well until today. Additionally, she does report that she has been taking some Sudafed for a sinus infection.  The history is provided by the patient. No language interpreter was used.    Past Medical History  Diagnosis Date  . GERD (gastroesophageal reflux disease)   . H pylori ulcer   . Breast lump     Mammogram (last 2013)  . Sinusitis   . Herpes simplex labialis   . Palpitations 06/14/2012   Past Surgical History  Procedure Laterality Date  . Cesarean section    . Tubal ligation      2008   Family History  Problem Relation Age of Onset  . Diabetes Father    History  Substance Use Topics  . Smoking status: Former Smoker    Quit date: 06/15/2007  . Smokeless tobacco: Not on file  . Alcohol Use: Yes     Comment: Occasional use   OB History    Gravida Para Term Preterm AB TAB SAB Ectopic Multiple Living   6 4 4  2 2   1 5       Obstetric Comments   Twins c-section in 2008     Review of Systems  Constitutional: Negative for fever and chills.  HENT: Positive for congestion. Negative for postnasal drip, rhinorrhea, sinus pressure, sneezing and sore throat.    Respiratory: Negative for cough and shortness of breath.   Cardiovascular: Positive for chest pain and palpitations.  Gastrointestinal: Negative for nausea, vomiting, abdominal pain, diarrhea and constipation.  Genitourinary: Negative for dysuria.  All other systems reviewed and are negative.     Allergies  Tramadol  Home Medications   Prior to Admission medications   Medication Sig Start Date End Date Taking? Authorizing Provider  HYDROcodone-acetaminophen (NORCO/VICODIN) 5-325 MG per tablet Take 1 tablet by mouth every 4 (four) hours as needed for moderate pain or severe pain. 09/27/14   Trixie Dredge, PA-C  ibuprofen (ADVIL,MOTRIN) 800 MG tablet Take 1 tablet (800 mg total) by mouth every 8 (eight) hours as needed for mild pain or moderate pain. 09/27/14   Trixie Dredge, PA-C  predniSONE (DELTASONE) 20 MG tablet Take 3 tabs po on first day, 2 tabs second day, 2 tabs third day, 1 tab fourth day, 1 tab 5th day. Take with food. 11/11/14   Hayden Rasmussen, NP  valACYclovir (VALTREX) 1000 MG tablet TAKE ONE TABLET BY MOUTH EVERY 12 HOURS FOR 2 DAYS 10/01/14   Pincus Large, DO   BP 118/89 mmHg  Pulse 138  Temp(Src) 97.4 F (36.3 C) (Oral)  Resp 20  Ht 5' 6.5" (1.689 m)  Wt 215 lb (97.523 kg)  BMI 34.19 kg/m2  SpO2 100% Physical Exam  Constitutional:  She is oriented to person, place, and time. She appears well-developed and well-nourished.  HENT:  Head: Normocephalic and atraumatic.  Eyes: Conjunctivae and EOM are normal. Pupils are equal, round, and reactive to light.  Neck: Normal range of motion. Neck supple.  Cardiovascular: Exam reveals no gallop and no friction rub.   No murmur heard. Irregularly irregular  Pulmonary/Chest: Effort normal and breath sounds normal. No respiratory distress. She has no wheezes. She has no rales. She exhibits no tenderness.  Abdominal: Soft. She exhibits no distension and no mass. There is no tenderness. There is no rebound and no guarding.   Musculoskeletal: Normal range of motion. She exhibits no edema or tenderness.  Neurological: She is alert and oriented to person, place, and time.  Skin: Skin is warm and dry.  Psychiatric: She has a normal mood and affect. Her behavior is normal. Judgment and thought content normal.  Nursing note and vitals reviewed.   ED Course  Procedures (including critical care time) Results for orders placed or performed during the hospital encounter of 11/25/14  CBC with Differential  Result Value Ref Range   WBC 8.5 4.0 - 10.5 K/uL   RBC 4.50 3.87 - 5.11 MIL/uL   Hemoglobin 13.6 12.0 - 15.0 g/dL   HCT 16.1 09.6 - 04.5 %   MCV 90.0 78.0 - 100.0 fL   MCH 30.2 26.0 - 34.0 pg   MCHC 33.6 30.0 - 36.0 g/dL   RDW 40.9 81.1 - 91.4 %   Platelets 415 (H) 150 - 400 K/uL   Neutrophils Relative % 55 43 - 77 %   Neutro Abs 4.7 1.7 - 7.7 K/uL   Lymphocytes Relative 37 12 - 46 %   Lymphs Abs 3.2 0.7 - 4.0 K/uL   Monocytes Relative 6 3 - 12 %   Monocytes Absolute 0.5 0.1 - 1.0 K/uL   Eosinophils Relative 2 0 - 5 %   Eosinophils Absolute 0.1 0.0 - 0.7 K/uL   Basophils Relative 0 0 - 1 %   Basophils Absolute 0.0 0.0 - 0.1 K/uL  Basic metabolic panel  Result Value Ref Range   Sodium 134 (L) 137 - 147 mEq/L   Potassium 6.3 (H) 3.7 - 5.3 mEq/L   Chloride 102 96 - 112 mEq/L   CO2 21 19 - 32 mEq/L   Glucose, Bld 101 (H) 70 - 99 mg/dL   BUN 12 6 - 23 mg/dL   Creatinine, Ser 7.82 0.50 - 1.10 mg/dL   Calcium 9.0 8.4 - 95.6 mg/dL   GFR calc non Af Amer >90 >90 mL/min   GFR calc Af Amer >90 >90 mL/min   Anion gap 11 5 - 15  Urinalysis, Routine w reflex microscopic  Result Value Ref Range   Color, Urine YELLOW YELLOW   APPearance CLEAR CLEAR   Specific Gravity, Urine 1.006 1.005 - 1.030   pH 6.0 5.0 - 8.0   Glucose, UA NEGATIVE NEGATIVE mg/dL   Hgb urine dipstick TRACE (A) NEGATIVE   Bilirubin Urine NEGATIVE NEGATIVE   Ketones, ur NEGATIVE NEGATIVE mg/dL   Protein, ur NEGATIVE NEGATIVE mg/dL    Urobilinogen, UA 0.2 0.0 - 1.0 mg/dL   Nitrite NEGATIVE NEGATIVE   Leukocytes, UA NEGATIVE NEGATIVE  Urine rapid drug screen (hosp performed)  Result Value Ref Range   Opiates NONE DETECTED NONE DETECTED   Cocaine NONE DETECTED NONE DETECTED   Benzodiazepines NONE DETECTED NONE DETECTED   Amphetamines NONE DETECTED NONE DETECTED   Tetrahydrocannabinol POSITIVE (A) NONE  DETECTED   Barbiturates NONE DETECTED NONE DETECTED  Potassium  Result Value Ref Range   Potassium 4.2 3.7 - 5.3 mEq/L  Urine microscopic-add on  Result Value Ref Range   Squamous Epithelial / LPF RARE RARE   WBC, UA 0-2 <3 WBC/hpf   RBC / HPF  <3 RBC/hpf    NO FORMED ELEMENTS SEEN ON URINE MICROSCOPIC EXAMINATION   Bacteria, UA RARE RARE  TSH  Result Value Ref Range   TSH 0.976 0.350 - 4.500 uIU/mL  I-stat troponin, ED  Result Value Ref Range   Troponin i, poc 0.00 0.00 - 0.08 ng/mL   Comment 3          POC urine preg, [facility])  Result Value Ref Range   Preg Test, Ur NEGATIVE NEGATIVE  I-stat chem 8, ed  Result Value Ref Range   Sodium 136 (L) 137 - 147 mEq/L   Potassium 6.1 (H) 3.7 - 5.3 mEq/L   Chloride 108 96 - 112 mEq/L   BUN 17 6 - 23 mg/dL   Creatinine, Ser 6.961.00 0.50 - 1.10 mg/dL   Glucose, Bld 99 70 - 99 mg/dL   Calcium, Ion 2.951.09 (L) 1.12 - 1.23 mmol/L   TCO2 22 0 - 100 mmol/L   Hemoglobin 15.0 12.0 - 15.0 g/dL   HCT 28.444.0 13.236.0 - 44.046.0 %   Dg Chest Port 1 View  11/25/2014   CLINICAL DATA:  Tachycardia and chest tightness.  EXAM: PORTABLE CHEST - 1 VIEW  COMPARISON:  06/17/2011.  FINDINGS: Trachea is midline. Heart size stable. Lungs are clear. No pleural fluid.  IMPRESSION: Negative.   Electronically Signed   By: Leanna BattlesMelinda  Blietz M.D.   On: 11/25/2014 07:56     Imaging Review No results found.   EKG Interpretation   Date/Time:  Wednesday November 25 2014 07:18:29 EST Ventricular Rate:  153 PR Interval:    QRS Duration: 64 QT Interval:  286 QTC Calculation: 456 R Axis:   72 Text  Interpretation:  Atrial fibrillation with rapid ventricular response  Septal infarct , age undetermined Abnormal ECG Since last tracing Atrial  fibrillation NOW PRESENT Confirmed by MILLER  MD, BRIAN ([id number]) on  11/25/2014 7:39:19 AM      MDM   Final diagnoses:  Chest pain   Patient with regular rapid heartbeat, consider A. fib versus a flutter. Will treat with Cardizem.  Patient seen by and discussed with Dr. Hyacinth MeekerMiller, who recommends UDS.    Patient was recently prescribed prednisone Sudafed, Claritin, however she states that she never took any of these medications. She does not have any history of PE or DVT. Denies any recent surgeries or immobilization. Denies any recent travel.  Patient is rate controlled with Cardizem. Will hold Cardizem drip for the time being, and test her rate control. Will consult cardiology once labs are resulted.  12:34 PM Patient seen by and discussed with Dr. Hyacinth MeekerMiller and Dr. Elease HashimotoNahser.  Dr. Elease HashimotoNahser states that he will be happy to admit the patient, but also states the patient could go home if she is feeling better. Patient is rate controlled on Cardizem. Patient has received Xarelto. Upon reevaluation, the patient still feels some fluttering in her chest. She states this makes her uncomfortable. She states that she did feel much more calm from left she was able to stay in the hospital overnight. I will notify cardiology of her decision and she will be admitted.  Medications  diltiazem (CARDIZEM) 1 mg/mL load via infusion  10 mg (0 mg Intravenous Stopped 11/25/14 0747)    And  diltiazem (CARDIZEM) 100 mg in dextrose 5 % 100 mL (1 mg/mL) infusion (0 mg/hr Intravenous Stopped 11/25/14 0800)  diltiazem (CARDIZEM) 1 mg/mL load via infusion 10 mg (0 mg Intravenous Stopped 11/25/14 1007)    And  diltiazem (CARDIZEM) 100 mg in dextrose 5 % 100 mL (1 mg/mL) infusion (5 mg/hr Intravenous New Bag/Given 11/25/14 1003)  metoprolol tartrate (LOPRESSOR) tablet 25 mg (25 mg Oral  Given 11/25/14 1001)  rivaroxaban (XARELTO) tablet 20 mg (20 mg Oral Given 11/25/14 1105)  nitroGLYCERIN (NITROSTAT) SL tablet 0.4 mg (0.4 mg Sublingual Given 11/25/14 1325)  acetaminophen (TYLENOL) tablet 650 mg (not administered)  ondansetron (ZOFRAN) injection 4 mg (not administered)  ALPRAZolam (XANAX) tablet 0.25 mg (not administered)  zolpidem (AMBIEN) tablet 5 mg (not administered)  HYDROcodone-acetaminophen (NORCO/VICODIN) 5-325 MG per tablet 1 tablet (not administered)  pantoprazole (PROTONIX) EC tablet 40 mg (not administered)  aspirin chewable tablet 324 mg (324 mg Oral Given 11/25/14 0752)    CRITICAL CARE Performed by: Roxy HorsemanBROWNING, Sencere Symonette   Total critical care time: 40  Critical care time was exclusive of separately billable procedures and treating other patients.  Critical care was necessary to treat or prevent imminent or life-threatening deterioration.  Critical care was time spent personally by me on the following activities: development of treatment plan with patient and/or surrogate as well as nursing, discussions with consultants, evaluation of patient's response to treatment, examination of patient, obtaining history from patient or surrogate, ordering and performing treatments and interventions, ordering and review of laboratory studies, ordering and review of radiographic studies, pulse oximetry and re-evaluation of patient's condition.   Roxy Horsemanobert Dorismar Chay, PA-C 11/25/14 1345  Vida RollerBrian D Miller, MD 11/26/14 [phone]

## 2014-11-25 NOTE — ED Notes (Signed)
Pt here c/o palpitations and CP starting last night; pt noted to be tachycardic and sts some SOB

## 2014-11-25 NOTE — ED Provider Notes (Signed)
The patient is a 38 year old female, she denies any significant past medical history who presents to the hospital with a complaint of palpitations. She states that she has been having intermittent chest pain for the last couple of weeks which is spontaneous, not associated with exertion but has been having associated left-sided lower jaw pain. She was seen at an urgent care for that, told she had some sinus problems most likely and was prescribed Sudafed. She denies taking that or any other over-the-counter medications and denies any substance abuse, unexpected weight loss or other symptoms of hyperthyroidism. She has not had any alcohol in over one week, on exam she has a rapid heart rate which is irregularly irregular consistent with atrial fibrillation, palpable pulses and a normal blood pressure. She has no peripheral edema, no hypoxia, she will need further workup for the source of her chest pain, palpitations, starting with Cardizem with a drip.  The pt went on a cardizem drip and did not convert - admitted by cardiology.  BP did become soft around 100 systolic on drip.  CRITICAL CARE Performed by: Vida RollerMILLER,Essance Gatti D Total critical care time: 35 Critical care time was exclusive of separately billable procedures and treating other patients. Critical care was necessary to treat or prevent imminent or life-threatening deterioration. Critical care was time spent personally by me on the following activities: development of treatment plan with patient and/or surrogate as well as nursing, discussions with consultants, evaluation of patient's response to treatment, examination of patient, obtaining history from patient or surrogate, ordering and performing treatments and interventions, ordering and review of laboratory studies, ordering and review of radiographic studies, pulse oximetry and re-evaluation of patient's condition.    EKG Interpretation  Date/Time:  Wednesday November 25 2014 07:18:29  EST Ventricular Rate:  153 PR Interval:    QRS Duration: 64 QT Interval:  286 QTC Calculation: 456 R Axis:   72 Text Interpretation:  Atrial fibrillation with rapid ventricular response Septal infarct , age undetermined Abnormal ECG Since last tracing Atrial fibrillation NOW PRESENT Confirmed by Lajarvis Italiano  MD, Sahid Borba ([id number]) on 11/25/2014 7:39:19 AM      ED ECG REPORT  I personally interpreted this EKG   Date: 11/26/2014  0800 AM  Rate: 80  Rhythm: atrial fibrillation  QRS Axis: normal  Intervals: normal  ST/T Wave abnormalities: nonspecific T wave changes  Conduction Disutrbances:none  Narrative Interpretation: now rate controlled  Old EKG Reviewed: changes noted  Medical screening examination/treatment/procedure(s) were conducted as a shared visit with non-physician practitioner(s) and myself.  I personally evaluated the patient during the encounter.  Clinical Impression:   Final diagnoses:  Chest pain  Atrial Fibrillation with Rapid Ventricular Response      Vida RollerBrian D Janecia Palau, MD 11/26/14 [phone]

## 2014-11-25 NOTE — ED Notes (Signed)
This RN went in to round on pt, pt c/o 8/10 dull cp in left side of chest, no radiation. EKG done see MAR for protocol cp order.

## 2014-11-25 NOTE — Consult Note (Signed)
Consult Note.    Date: 11/25/2014               Patient Name:  Melanie Vincent MRN: [id number]  DOB: Jan 10, 1976 Age / Sex: 38 y.o., female        PCP: No primary care provider on file. Primary Cardiologist: Hochrein         History of Present Illness: Patient is a 38 y.o. female with a PMHx of palpitations , who [facility] for evaluation of Atrial fibrillation .   Pt presents with palpitations. Started this am. Woke up at 5 AM with palpitations.  No CP or dyspnea associated with the Afib  She has had some vague CP over the past 2 weeks. Not related to activity.   Associated signs and symptoms: Some jaw pain and a head ache recently.  Modifying factors: Nothing seems to make it better or worse.   Non smoker Rare ETOH  Fhx: Mother with MVP,   Medications: Outpatient medications:  (Not in a hospital admission)  Allergies  Allergen Reactions  . Tramadol Other (See Comments)    Pt states that this medication causes insomnia and for her to have "crazy thoughts".      Past Medical History  Diagnosis Date  . GERD (gastroesophageal reflux disease)   . H pylori ulcer   . Breast lump     Mammogram (last 2013)  . Sinusitis   . Herpes simplex labialis   . Palpitations 06/14/2012    Past Surgical History  Procedure Laterality Date  . Cesarean section    . Tubal ligation      2008    Family History  Problem Relation Age of Onset  . Diabetes Father     Social History:  reports that she quit smoking about 7 years ago. She does not have any smokeless tobacco history on file. She reports that she drinks alcohol. She reports that she uses illicit drugs (Marijuana).   Review of Systems: Constitutional:  denies fever, chills, diaphoresis, appetite change and fatigue.  HEENT: denies photophobia, eye pain, redness, hearing loss, ear pain,  congestion, sore throat, rhinorrhea, sneezing, neck pain, neck stiffness and tinnitus.  Respiratory: denies SOB, DOE, cough, chest tightness, and wheezing.  Cardiovascular: admits to chest pain, + palpitations , no leg swelling.  Gastrointestinal: denies nausea, vomiting, abdominal pain, diarrhea, constipation, has some GERD .  Genitourinary: denies dysuria, urgency, frequency, hematuria, flank pain and difficulty urinating.  Musculoskeletal: denies Myalgias, occasional back pain,   Skin: denies pallor, rash and wound.  Neurological: denies dizziness, seizures, syncope, weakness, light-headedness, numbness and headaches.   Hematological: denies adenopathy, easy bruising, personal or family bleeding history.  Psychiatric/ Behavioral: denies suicidal ideation, mood changes, confusion, nervousness, sleep disturbance and agitation.     Physical Exam: BP 113/80 mmHg  Pulse 93  Temp(Src) 97.4 F (36.3 C) (Oral)  Resp 20  Ht 5' 6.5" (1.689 m)  Wt 215 lb (97.523 kg)  BMI 34.19 kg/m2  SpO2 100%  Wt Readings from Last 3 Encounters:  11/25/14 215 lb (97.523 kg)  09/27/14 219 lb 4 oz (99.451 kg)  06/14/14 212 lb (96.163 kg)    General: Vital signs reviewed and noted. Well-developed, well-nourished, in no acute distress; alert,   Head: Normocephalic, atraumatic, sclera anicteric, mucus membranes are moist   Neck: Supple. Negative for carotid bruits. JVD not elevated.   Lungs:  Clear bilaterally to auscultation without wheezes, rales, or rhonchi. Breathing is normal  Heart: Irreg. Irreg.   Abdomen:  Soft, non-tender, non-distended with normoactive bowel sounds. No hepatomegaly. No rebound/guarding. No obvious abdominal masses   MSK: Strength and the appear normal for age.   Extremities: No clubbing or cyanosis. No edema. Distal pedal pulses are 2+ and equal bilaterally .  Neurologic: Alert and oriented X 3. Moves all extremities  spontaneously   Psych: normal     Lab results: Basic Metabolic Panel:  Last Labs      Recent Labs Lab 11/25/14 0736 11/25/14 0848  NA 134* 136*  K 6.3* 6.1*  CL 102 108  CO2 21 --   GLUCOSE 101* 99  BUN 12 17  CREATININE 0.81 1.00  CALCIUM 9.0 --       Liver Function Tests:  Last Labs     No results for input(s): AST, ALT, ALKPHOS, BILITOT, PROT, ALBUMIN in the last 168 hours.    Last Labs     No results for input(s): LIPASE, AMYLASE in the last 168 hours.    CBC:  Last Labs      Recent Labs Lab 11/25/14 0736 11/25/14 0848  WBC 8.5 --   NEUTROABS 4.7 --   HGB 13.6 15.0  HCT 40.5 44.0  MCV 90.0 --   PLT 415* --       Cardiac Enzymes:  Last Labs     No results for input(s): CKTOTAL, CKMB, CKMBINDEX, TROPONINI in the last 168 hours.    BNP:  Last Labs     Invalid input(s): POCBNP    CBG:  Last Labs     No results for input(s): GLUCAP in the last 168 hours.    Coagulation Studies:  Recent Labs (last 2 labs)     No results for input(s): LABPROT, INR in the last 72 hours.     Other results:  EKG atrial fib        Assessment & Plan:  1. Atrial fib: Patient presents with new dx of atrial fib.  She has had palpitations for years but has never been diagnosed with Afib. Although she started feeling the Afib this am, we cannot be certain that she has had afib only for a few hours.   I would start her on a Dilt drip. Start Xarelto 20 mg a day ( with lunch)  I would start Metoprolol 25 BID.  I anticipate that she can be discharged later in the afternoon. She may convert with the Dilt drip.  Her HR is fairly well controlled and she is in no distress at present She works and is also a Theatre stage managernursing student.  She is missig a final exam and will need a note to excuse her from the exam with a request for it To be rescheduled   She will need an echo scheduled this week.    She  should follow up with Dr Antoine PocheHochrein or a PA  in the office in 1 week.   Vesta MixerPhilip J. Veretta Sabourin, Montez HagemanJr., MD, [facility] 11/25/2014, 9:14 AM

## 2014-11-26 DIAGNOSIS — R079 Chest pain, unspecified: Secondary | ICD-10-CM | POA: Diagnosis not present

## 2014-11-26 DIAGNOSIS — I4891 Unspecified atrial fibrillation: Secondary | ICD-10-CM

## 2014-11-26 DIAGNOSIS — K219 Gastro-esophageal reflux disease without esophagitis: Secondary | ICD-10-CM | POA: Diagnosis not present

## 2014-11-26 DIAGNOSIS — N63 Unspecified lump in breast: Secondary | ICD-10-CM | POA: Diagnosis not present

## 2014-11-26 DIAGNOSIS — R0981 Nasal congestion: Secondary | ICD-10-CM | POA: Diagnosis not present

## 2014-11-26 LAB — BASIC METABOLIC PANEL
Anion gap: 13 (ref 5–15)
BUN: 10 mg/dL (ref 6–23)
CO2: 22 mEq/L (ref 19–32)
Calcium: 9.4 mg/dL (ref 8.4–10.5)
Chloride: 104 mEq/L (ref 96–112)
Creatinine, Ser: 0.73 mg/dL (ref 0.50–1.10)
GFR calc Af Amer: >90 mL/min (ref >90)
GFR calc non Af Amer: >90 mL/min (ref >90)
Glucose, Bld: 87 mg/dL (ref 70–99)
Potassium: 4.3 mEq/L (ref 3.7–5.3)
Sodium: 139 mEq/L (ref 137–147)

## 2014-11-26 LAB — TROPONIN I

## 2014-11-26 MED ORDER — OFF THE BEAT BOOK
Freq: Once | Status: DC
  Filled 2014-11-26: qty 1

## 2014-11-26 MED ORDER — ASPIRIN 81 MG PO CHEW
81.0000 mg | CHEWABLE_TABLET | Freq: Every day | ORAL | Status: DC
  Filled 2014-11-26: qty 1

## 2014-11-26 MED ORDER — ASPIRIN 81 MG PO CHEW: 81.0000 mg | CHEWABLE_TABLET | Freq: Every day | ORAL | Status: DC

## 2014-11-26 MED ORDER — METOPROLOL TARTRATE 25 MG PO TABS: 25.0000 mg | ORAL_TABLET | Freq: Two times a day (BID) | ORAL | Status: DC

## 2014-11-26 NOTE — Progress Notes (Signed)
  Echocardiogram 2D Echocardiogram has been performed.  Leta JunglingCooper, Brynley Cuddeback M 11/26/2014, 10:43 AM

## 2014-11-26 NOTE — Progress Notes (Signed)
Subjective: 8/10 CP at 0130 hrs.  Resolved  Objective: Vital signs in last 24 hours: Temp:  [97.5 F (36.4 C)-99.3 F (37.4 C)] 99.3 F (37.4 C) (12/17 0807) Pulse Rate:  [58-93] 90 (12/17 0807) Resp:  [12-30] 18 (12/17 0807) BP: (84-123)/(48-80) 104/57 mmHg (12/17 0807) SpO2:  [94 %-100 %] 99 % (12/17 0807) Weight:  [214 lb 3.2 oz (97.16 kg)] 214 lb 3.2 oz (97.16 kg) (12/17 0500) Last BM Date: 11/25/14  Intake/Output from previous day: 12/16 0701 - 12/17 0700 In: 524.2 [P.O.:480; I.V.:44.2] Out: -  Intake/Output this shift:    Medications Current Facility-Administered Medications  Medication Dose Route Frequency Provider Last Rate Last Dose  . acetaminophen (TYLENOL) tablet 650 mg  650 mg Oral Q4H PRN Abelino DerrickLuke K Kilroy, PA-C   650 mg at 11/25/14 2210  . ALPRAZolam Prudy Feeler(XANAX) tablet 0.25 mg  0.25 mg Oral BID PRN Abelino DerrickLuke K Kilroy, PA-C   0.25 mg at 11/26/14 0134  . diltiazem (CARDIZEM) 100 mg in dextrose 5 % 100 mL (1 mg/mL) infusion  5-15 mg/hr Intravenous Continuous Roxy Horsemanobert Browning, PA-C   Stopped at 11/25/14 0800  . diltiazem (CARDIZEM) 100 mg in dextrose 5 % 100 mL (1 mg/mL) infusion  5-15 mg/hr Intravenous Continuous Roxy Horsemanobert Browning, PA-C   Stopped at 11/26/14 0220  . HYDROcodone-acetaminophen (NORCO/VICODIN) 5-325 MG per tablet 1 tablet  1 tablet Oral Q4H PRN Abelino DerrickLuke K Kilroy, PA-C   1 tablet at 11/25/14 1820  . Influenza vac split quadrivalent PF (FLUARIX) injection 0.5 mL  0.5 mL Intramuscular Tomorrow-1000 Vesta MixerPhilip J Nahser, MD      . metoprolol tartrate (LOPRESSOR) tablet 25 mg  25 mg Oral BID Vesta MixerPhilip J Nahser, MD   25 mg at 11/25/14 2205  . nitroGLYCERIN (NITROSTAT) SL tablet 0.4 mg  0.4 mg Sublingual Q5 min PRN Suzi RootsKevin E Steinl, MD   0.4 mg at 11/25/14 1325  . ondansetron (ZOFRAN) injection 4 mg  4 mg Intravenous Q6H PRN Abelino DerrickLuke K Kilroy, PA-C   4 mg at 11/25/14 1902  . pantoprazole (PROTONIX) EC tablet 40 mg  40 mg Oral Daily Abelino DerrickLuke K Kilroy, PA-C   40 mg at 11/25/14 1541  .  rivaroxaban (XARELTO) tablet 20 mg  20 mg Oral Q supper Vesta MixerPhilip J Nahser, MD   20 mg at 11/25/14 1105  . zolpidem (AMBIEN) tablet 5 mg  5 mg Oral QHS PRN Abelino DerrickLuke K Kilroy, PA-C   5 mg at 11/25/14 2210    PE: General appearance: alert, cooperative and no distress Lungs: clear to auscultation bilaterally Heart: regular rate and rhythm, S1, S2 normal, no murmur, click, rub or gallop Extremities: No LEE Pulses: 2+ and symmetric Skin: Warm and dry Neurologic: Grossly normal  Lab Results:   Recent Labs  11/25/14 0736 11/25/14 0848  WBC 8.5  --   HGB 13.6 15.0  HCT 40.5 44.0  PLT 415*  --    BMET  Recent Labs  11/25/14 0736 11/25/14 0848 11/25/14 0901 11/26/14 0426  NA 134* 136*  --  139  K 6.3* 6.1* 4.2 4.3  CL 102 108  --  104  CO2 21  --   --  22  GLUCOSE 101* 99  --  87  BUN 12 17  --  10  CREATININE 0.81 1.00  --  0.73  CALCIUM 9.0  --   --  9.4    Assessment/Plan  Active Problems:   Atrial fibrillation with RVR The patient converted to NSR overnight and the  RN stopped the diltiazem per fellow's orders.  We also have her on lopressor 25mg  BID which she was not on previously.  Xarelto started.  Echo pending.  BP soft. Should improve since off IV dilt.        Chest pain 8/10 Dull ache at 0121hrs.  Resolved.  EKG is not ischemic, however, it was not completed at the time of her pain but three hours later.  Will check troponin now.  No nausea, SOB, diaphoresis.   She has been having CP her whole life she said.  This was not much different.  Never had a stress test.  She already ate.  If troponin negative, consider OP stress test.      Obesity  Dietary changes and exercise discussed.       Ambulate this morning.  Likely DC home today.    LOS: 1 day    HAGER, BRYAN PA-C 11/26/2014 8:18 AM  Patient seen and examined and history reviewed. Agree with above findings and plan. Feeling well this am. converted to NSR. Admits to drinking more than usual caffeine yesterday.  Echo pending today. Labs OK. Will plan on DC home today on metoprolol. [place] score is 0 so will just place on ASA 81 mg daily.   Adalbert Alberto Swazil[place]Jordan, MDFACC 11/26/2014 9:57 AM

## 2014-11-26 NOTE — Discharge Summary (Signed)
Physician Discharge Summary     Cardiologist:  Hochrein  Patient ID: Melanie Vincent MRN: [id number] DOB/AGE: 15-Apr-1976 38 y.o.  Admit date: 11/25/2014 Discharge date: 11/26/2014  Admission Diagnoses:  Afib RVR  Discharge Diagnoses:  Active Problems:   Atrial fibrillation with RVR   Chest Pain   Obesity  Discharged Condition: stable  Hospital Course:  Patient is a 38 y.o. female with a PMHx of palpitations , who was [facility]/2014 for evaluation of Atrial fibrillation.  She Woke up at 5 AM with palpitations. No CP or dyspnea associated with the Afib.  She has had some vague CP over the past 2 weeks. Not related to activity.  Associated signs and symptoms: Some jaw pain and a head ache recently. Modifying factors: Nothing seems to make it better or worse. Non smoker.  Rare ETOH  She was admitted and started on IV diltiazem, metoprolol 25mg  BID and Xarelto increase cardioversion needed.  She spontaneously converted overnight and diltiazem was stopped.  CHADSVASC 0.  Xarelto stopped and continued on ASA 81mg .  She developed 8/10 chest pressure during the night which resolved.  She reports chronic CP her whole life.  Consider OP GXT.  Diet changes and exercise were discussed. 2D echo revealed normal EF and valve function. The patient was seen by Dr. SwazilandJordan who felt she was stable for DC home.   Consults: None  Significant Diagnostic Studies:  Echocardiogram Study Conclusions  - Left ventricle: The cavity size was normal. Systolic function was normal. The estimated ejection fraction was in the range of 55% to 60%.  Treatments: See above  Discharge Exam: Blood pressure 108/71, pulse 78, temperature 98.4 F (36.9 C), temperature source Oral, resp. rate 18, height 5' 6.5" (1.689 m), weight 214 lb 3.2 oz (97.16 kg), last menstrual period 11/25/2014, SpO2 99 %.   Disposition: 01-Home or Self Care     Medication List    STOP taking these  medications        predniSONE 20 MG tablet  Commonly known as:  DELTASONE     valACYclovir 1000 MG tablet  Commonly known as:  VALTREX      TAKE these medications        aspirin 81 MG chewable tablet  Chew 1 tablet (81 mg total) by mouth daily.     HYDROcodone-acetaminophen 5-325 MG per tablet  Commonly known as:  NORCO/VICODIN  Take 1 tablet by mouth every 4 (four) hours as needed for moderate pain or severe pain.     ibuprofen 800 MG tablet  Commonly known as:  ADVIL,MOTRIN  Take 1 tablet (800 mg total) by mouth every 8 (eight) hours as needed for mild pain or moderate pain.     metoprolol tartrate 25 MG tablet  Commonly known as:  LOPRESSOR  Take 1 tablet (25 mg total) by mouth 2 (two) times daily.     omeprazole 20 MG capsule  Commonly known as:  PRILOSEC  Take 20 mg by mouth as needed (heartburn).       Follow-up Information    Follow up with Baker JanusPhelps, Jazma N, DO On 12/01/2014.   Specialty:  Family Medicine   Why:  @ 3:30 for hospital f/u. Please call Britta MccreedyBarbara  @ Family Medicine for financial assisatnce to schedule appointment as well   Contact information:   [address] [place] [address] [phone]       Follow up with Rollene RotundaJames Hochrein, MD On 12/02/2014.   Specialty:  Cardiology  Why:  10:15 AM   Contact information:   [address] [place] [address] [phone]      Greater than 30 minutes was spent completing the patient's discharge.   SignedWilburt Finlay: Saeed Toren, PA-C 11/26/2014, 12:24 PM

## 2014-11-26 NOTE — Progress Notes (Signed)
UR completed 

## 2014-11-26 NOTE — Care Management Note (Signed)
    Page 1 of 1   11/26/2014     10:49:46 AM CARE MANAGEMENT NOTE 11/26/2014  Patient:  Bolivar HawVANS,Jerline C   Account Number:  [id number]  Date Initiated:  11/26/2014  Documentation initiated by:  GRAVES-BIGELOW,Claudell Wohler  Subjective/Objective Assessment:   Pt admitted for palpitations. Pt without insurance and will need to be d/c on generic medications. Plan for d/c today and CM did speak with pt in regards to medications. She usually gets medicaitons from [employer] [address]     Action/Plan:   [facility] Pt to speak with financial counselor during that time. No further needs from CM at this time.   Anticipated DC Date:     Anticipated DC Plan:           Choice offered to / List presented to:             Status of service:  Completed, signed off Medicare Important Message given?  NO (If response is "NO", the following Medicare IM given date fields will be blank) Date Medicare IM given:   Medicare IM given by:   Date Additional Medicare IM given:   Additional Medicare IM given by:    Discharge Disposition:  HOME/SELF CARE  Per UR Regulation:  Reviewed for med. necessity/level of care/duration of stay  If discussed at Long Length of Stay Meetings, dates discussed:    Comments:

## 2014-11-26 NOTE — Progress Notes (Addendum)
Patient converted to NSR at 0127. Paged on call cardiologist and was verbally told to hold IV Cardizem after thirty minutes until further notice. Patient's HR in 70s. Will continue to monitor.   Burchett, Valente DavidMeredith J, RN     Patient still in NSR and heart rate remained in 70s throughout the night.

## 2014-12-01 ENCOUNTER — Ambulatory Visit: Payer: Self-pay | Admitting: Family Medicine

## 2014-12-02 ENCOUNTER — Ambulatory Visit (INDEPENDENT_AMBULATORY_CARE_PROVIDER_SITE_OTHER): Payer: Self-pay | Admitting: Cardiology

## 2014-12-02 ENCOUNTER — Encounter: Payer: Self-pay | Admitting: Cardiology

## 2014-12-02 VITALS — BP 110/60 | HR 73 | Ht 66.0 in | Wt 211.2 lb

## 2014-12-02 DIAGNOSIS — I4891 Unspecified atrial fibrillation: Secondary | ICD-10-CM

## 2014-12-02 NOTE — Patient Instructions (Signed)
Your physician recommends that you schedule a follow-up appointment in:  One year with Dr. Antoine PocheHochrein  Stop taking your ASA

## 2014-12-02 NOTE — Progress Notes (Signed)
HPI The patient presents for followup after hospitalization for atrial fibrillation. She spontaneously converted to sinus rhythm. Echocardiogram was unremarkable. She did have some chest pain but she says that this has been a chronic problem.  Since she was last seen she has done well.  The patient denies any new symptoms such as chest discomfort, neck or arm discomfort. There has been no new shortness of breath, PND or orthopnea. There have been no reported palpitations, presyncope or syncope.  She is eating right, avoiding caffeine and exercising.  Her chest pain is not bothering her.   Allergies  Allergen Reactions  . Tramadol Other (See Comments)    Pt states that this medication causes insomnia and for her to have "crazy thoughts".     Current Outpatient Prescriptions  Medication Sig Dispense Refill  . aspirin 81 MG chewable tablet Chew 1 tablet (81 mg total) by mouth daily.    Marland Kitchen. HYDROcodone-acetaminophen (NORCO/VICODIN) 5-325 MG per tablet Take 1 tablet by mouth every 4 (four) hours as needed for moderate pain or severe pain. 15 tablet 0  . ibuprofen (ADVIL,MOTRIN) 800 MG tablet Take 1 tablet (800 mg total) by mouth every 8 (eight) hours as needed for mild pain or moderate pain. 15 tablet 0  . metoprolol tartrate (LOPRESSOR) 25 MG tablet Take 1 tablet (25 mg total) by mouth 2 (two) times daily. 60 tablet 5  . omeprazole (PRILOSEC) 20 MG capsule Take 20 mg by mouth as needed (heartburn).    . [DISCONTINUED] ipratropium (ATROVENT) 0.06 % nasal spray Place 2 sprays into the nose 4 (four) times daily. 15 mL 12   No current facility-administered medications for this visit.    Past Medical History  Diagnosis Date  . GERD (gastroesophageal reflux disease)   . H pylori ulcer   . Breast lump     Mammogram (last 2013)  . Sinusitis   . Herpes simplex labialis   . Palpitations 06/14/2012  . Dysrhythmia 12/16/215      NEW ONSET ATRIAL FIBRILATION  . Anxiety     Past Surgical History   Procedure Laterality Date  . Cesarean section    . Tubal ligation      2008    ROS:  Anxiety.  Otherwise as stated in the HPI and negative for all other systems.  PHYSICAL EXAM BP 110/60 mmHg  Pulse 73  Ht 5\' 6"  (1.676 m)  Wt 211 lb 3.2 oz (95.8 kg)  BMI 34.10 kg/m2  LMP 11/25/2014 GENERAL:  Well appearing NECK:  No jugular venous distention, waveform within normal limits, carotid upstroke brisk and symmetric, no bruits, no thyromegaly LUNGS:  Clear to auscultation bilaterally CHEST:  Unremarkable HEART:  PMI not displaced or sustained,S1 and S2 within normal limits, no S3, no S4, no clicks, no rubs, no murmurs ABD:  Flat, positive bowel sounds normal in frequency in pitch, no bruits, no rebound, no guarding, no midline pulsatile mass, no hepatomegaly, no splenomegaly EXT:  2 plus pulses throughout, no edema, no cyanosis no clubbing   EKG:  Sinus rhythm, rate 73, axis within normal limits, intervals within normal limits, no acute ST-T wave changes.   ASSESSMENT AND PLAN  ATRIAL FIB:  She can stop her aspirin. We talked about what to do if she has any increasing palpitations. She'll continue to very low dose of beta blocker. No change in therapy or further evaluation is planned.  CHEST PAIN:  This is atypical. No further cardiovascular testing is suggested. She's been diagnosed  with costochondritis.

## 2014-12-08 ENCOUNTER — Ambulatory Visit: Payer: Self-pay | Admitting: Family Medicine

## 2014-12-16 ENCOUNTER — Ambulatory Visit: Payer: Self-pay | Admitting: Family Medicine

## 2014-12-16 ENCOUNTER — Ambulatory Visit: Payer: Self-pay

## 2015-01-14 ENCOUNTER — Telehealth: Payer: Self-pay | Admitting: Cardiology

## 2015-01-14 ENCOUNTER — Ambulatory Visit: Payer: Self-pay | Admitting: Cardiology

## 2015-01-14 NOTE — Telephone Encounter (Signed)
New Message  Pt calling to move hochrein appt on 2/18 to earliest as possible due to some recent drastic weight gain. Pt wanted to speak w/ Rn about condition. Please call back and dsicuss.

## 2015-01-18 NOTE — Telephone Encounter (Signed)
Called pt, no answer. LMTCB

## 2015-01-28 ENCOUNTER — Ambulatory Visit (INDEPENDENT_AMBULATORY_CARE_PROVIDER_SITE_OTHER): Payer: Medicaid Other | Admitting: Cardiology

## 2015-01-28 ENCOUNTER — Encounter: Payer: Self-pay | Admitting: Cardiology

## 2015-01-28 VITALS — BP 100/60 | HR 85 | Ht 67.0 in | Wt 215.0 lb

## 2015-01-28 DIAGNOSIS — I4891 Unspecified atrial fibrillation: Secondary | ICD-10-CM

## 2015-01-28 NOTE — Patient Instructions (Signed)
Your physician recommends that you schedule a follow-up appointment in: one year with Dr. Hochrein  

## 2015-01-28 NOTE — Progress Notes (Signed)
    HPI The patient presents for followup of atrial fibrillation. She spontaneously converted to sinus rhythm. Echocardiogram was unremarkable.   Since I last saw her she has done okay. She gets  Occasional skipped beats but none of the symptoms that was her fibrillation.   She does have some chest discomfort that is similar to her previous costochondritis and is made worse with a little movement. She otherwise thinks she is doing well. Her anxiety is better treated. She's doing some exercising. She's not having any presyncope or syncope. She's had no new shortness of breath.  Allergies  Allergen Reactions  . Tramadol Other (See Comments)    Pt states that this medication causes insomnia and for her to have "crazy thoughts".     Current Outpatient Prescriptions  Medication Sig Dispense Refill  . aspirin 81 MG chewable tablet Chew 1 tablet (81 mg total) by mouth daily.    Marland Kitchen. HYDROcodone-acetaminophen (NORCO/VICODIN) 5-325 MG per tablet Take 1 tablet by mouth every 4 (four) hours as needed for moderate pain or severe pain. 15 tablet 0  . ibuprofen (ADVIL,MOTRIN) 800 MG tablet Take 1 tablet (800 mg total) by mouth every 8 (eight) hours as needed for mild pain or moderate pain. 15 tablet 0  . metoprolol tartrate (LOPRESSOR) 25 MG tablet Take 12.5 mg by mouth 2 (two) times daily.    Marland Kitchen. omeprazole (PRILOSEC) 20 MG capsule Take 20 mg by mouth as needed (heartburn).    . [DISCONTINUED] ipratropium (ATROVENT) 0.06 % nasal spray Place 2 sprays into the nose 4 (four) times daily. 15 mL 12   No current facility-administered medications for this visit.    Past Medical History  Diagnosis Date  . GERD (gastroesophageal reflux disease)   . H pylori ulcer   . Breast lump     Mammogram (last 2013)  . Sinusitis   . Herpes simplex labialis   . Palpitations 06/14/2012  . Dysrhythmia 12/16/215      NEW ONSET ATRIAL FIBRILATION  . Anxiety     Past Surgical History  Procedure Laterality Date  . Cesarean  section    . Tubal ligation      2008    ROS:  As stated in the HPI and negative for all other systems.  PHYSICAL EXAM BP 100/60 mmHg  Pulse 85  Ht 5\' 7"  (1.702 m)  Wt 215 lb (97.523 kg)  BMI 33.67 kg/m2 GENERAL:  Well appearing NECK:  No jugular venous distention, waveform within normal limits, carotid upstroke brisk and symmetric, no bruits, no thyromegaly LUNGS:  Clear to auscultation bilaterally CHEST:  Unremarkable HEART:  PMI not displaced or sustained,S1 and S2 within normal limits, no S3, no S4, no clicks, no rubs, no murmurs ABD:  Flat, positive bowel sounds normal in frequency in pitch, no bruits, no rebound, no guarding, no midline pulsatile mass, no hepatomegaly, no splenomegaly EXT:  2 plus pulses throughout, no edema, no cyanosis no clubbing   EKG:  Sinus rhythm, rate 85, axis within normal limits, intervals within normal limits, no acute ST-T wave changes.  01/28/2015   ASSESSMENT AND PLAN  ATRIAL FIB:   She has had no further paroxysms of this. No change in therapy is indicated.  CHEST PAIN:  This is atypical. No further cardiovascular testing is suggested. She's been diagnosed with costochondritis.

## 2015-05-31 ENCOUNTER — Emergency Department (HOSPITAL_COMMUNITY): Payer: Medicaid Other

## 2015-05-31 ENCOUNTER — Emergency Department (HOSPITAL_COMMUNITY)
Admission: EM | Admit: 2015-05-31 | Discharge: 2015-05-31 | Disposition: A | Discharge status: Home / Self Care | Payer: Medicaid Other | Attending: Emergency Medicine | Admitting: Emergency Medicine

## 2015-05-31 ENCOUNTER — Encounter (HOSPITAL_COMMUNITY): Payer: Self-pay | Admitting: Family Medicine

## 2015-05-31 DIAGNOSIS — Z79899 Other long term (current) drug therapy: Secondary | ICD-10-CM | POA: Insufficient documentation

## 2015-05-31 DIAGNOSIS — F419 Anxiety disorder, unspecified: Secondary | ICD-10-CM | POA: Insufficient documentation

## 2015-05-31 DIAGNOSIS — Z87891 Personal history of nicotine dependence: Secondary | ICD-10-CM | POA: Insufficient documentation

## 2015-05-31 DIAGNOSIS — R200 Anesthesia of skin: Secondary | ICD-10-CM

## 2015-05-31 DIAGNOSIS — K219 Gastro-esophageal reflux disease without esophagitis: Secondary | ICD-10-CM | POA: Insufficient documentation

## 2015-05-31 DIAGNOSIS — Z7982 Long term (current) use of aspirin: Secondary | ICD-10-CM | POA: Insufficient documentation

## 2015-05-31 DIAGNOSIS — Z8742 Personal history of other diseases of the female genital tract: Secondary | ICD-10-CM | POA: Insufficient documentation

## 2015-05-31 DIAGNOSIS — M545 Low back pain: Secondary | ICD-10-CM | POA: Insufficient documentation

## 2015-05-31 DIAGNOSIS — Z8619 Personal history of other infectious and parasitic diseases: Secondary | ICD-10-CM | POA: Insufficient documentation

## 2015-05-31 LAB — BASIC METABOLIC PANEL
ANION GAP: 7 (ref 5–15)
BUN: 8 mg/dL (ref 6–20)
CALCIUM: 8.8 mg/dL — AB (ref 8.9–10.3)
CHLORIDE: 106 mmol/L (ref 101–111)
CO2: 23 mmol/L (ref 22–32)
Creatinine, Ser: 0.62 mg/dL (ref 0.44–1.00)
GFR calc non Af Amer: >60 mL/min (ref >60)
Glucose, Bld: 83 mg/dL (ref 65–99)
Potassium: 4 mmol/L (ref 3.5–5.1)
SODIUM: 136 mmol/L (ref 135–145)

## 2015-05-31 LAB — CBC WITH DIFFERENTIAL/PLATELET
BASOS PCT: 0 % (ref 0–1)
Basophils Absolute: 0 10*3/uL (ref 0.0–0.1)
EOS ABS: 0.1 10*3/uL (ref 0.0–0.7)
EOS PCT: 1 % (ref 0–5)
HEMATOCRIT: 36.3 % (ref 36.0–46.0)
HEMOGLOBIN: 12 g/dL (ref 12.0–15.0)
LYMPHS ABS: 2.6 10*3/uL (ref 0.7–4.0)
Lymphocytes Relative: 31 % (ref 12–46)
MCH: 28.9 pg (ref 26.0–34.0)
MCHC: 33.1 g/dL (ref 30.0–36.0)
MCV: 87.5 fL (ref 78.0–100.0)
MONO ABS: 0.4 10*3/uL (ref 0.1–1.0)
MONOS PCT: 5 % (ref 3–12)
Neutro Abs: 5.3 10*3/uL (ref 1.7–7.7)
Neutrophils Relative %: 63 % (ref 43–77)
Platelets: 393 10*3/uL (ref 150–400)
RBC: 4.15 MIL/uL (ref 3.87–5.11)
RDW: 13.9 % (ref 11.5–15.5)
WBC: 8.4 10*3/uL (ref 4.0–10.5)

## 2015-05-31 NOTE — ED Notes (Signed)
Pt here with lower lumbar pain. sts also 3 days of left arm numbness and now numbness in right arm and both legs.

## 2015-05-31 NOTE — ED Notes (Signed)
Patient transported to CT 

## 2015-05-31 NOTE — ED Provider Notes (Signed)
CSN: [id number]     Arrival date & time 05/31/15  1655 History   First MD Initiated Contact with Patient 05/31/15 1817     Chief Complaint  Patient presents with  . Back Pain  . Numbness     (Consider location/radiation/quality/duration/timing/severity/associated sxs/prior Treatment) HPI Comments: Patient here with 3 days of intermittent numbness to her left upper extremity which is now spread to her right upper extremity as well as bilateral lower extremities. Denies any headache. No bowel or bladder dysfunction. No saddle anesthesias. Denies any weakness. No changes to her gait. In terms have been spontaneous and nothing makes them better or worse. No treatment use prior to arrival. No prior history of same.  Patient is a 39 y.o. female presenting with back pain. The history is provided by the patient.  Back Pain   Past Medical History  Diagnosis Date  . GERD (gastroesophageal reflux disease)   . H pylori ulcer   . Breast lump     Mammogram (last 2013)  . Sinusitis   . Herpes simplex labialis   . Palpitations 06/14/2012  . Dysrhythmia 12/16/215      NEW ONSET ATRIAL FIBRILATION  . Anxiety    Past Surgical History  Procedure Laterality Date  . Cesarean section    . Tubal ligation      2008   Family History  Problem Relation Age of Onset  . Diabetes Father    History  Substance Use Topics  . Smoking status: Former Smoker    Quit date: 06/15/2007  . Smokeless tobacco: Never Used  . Alcohol Use: Yes     Comment: Occasional use   OB History    Gravida Para Term Preterm AB TAB SAB Ectopic Multiple Living   6 4 4  2 2   1 5       Obstetric Comments   Twins c-section in 2008     Review of Systems  Musculoskeletal: Positive for back pain.  All other systems reviewed and are negative.     Allergies  Tramadol  Home Medications   Prior to Admission medications   Medication Sig Start Date End Date Taking? Authorizing Provider  aspirin 325 MG tablet Take 325 mg  by mouth every 6 (six) hours as needed for mild pain.   Yes Historical Provider, MD  ranitidine (ZANTAC) 150 MG capsule Take 150 mg by mouth daily as needed for heartburn.   Yes Historical Provider, MD  aspirin 81 MG chewable tablet Chew 1 tablet (81 mg total) by mouth daily. Patient not taking: Reported on 05/31/2015 11/26/14   Dwana Melena, PA-C  HYDROcodone-acetaminophen (NORCO/VICODIN) 5-325 MG per tablet Take 1 tablet by mouth every 4 (four) hours as needed for moderate pain or severe pain. Patient not taking: Reported on 05/31/2015 09/27/14   Trixie Dredge, PA-C  ibuprofen (ADVIL,MOTRIN) 800 MG tablet Take 1 tablet (800 mg total) by mouth every 8 (eight) hours as needed for mild pain or moderate pain. Patient not taking: Reported on 05/31/2015 09/27/14   Trixie Dredge, PA-C  metoprolol tartrate (LOPRESSOR) 25 MG tablet Take 12.5 mg by mouth daily as needed. Take it for heart rhythm    Historical Provider, MD  omeprazole (PRILOSEC) 20 MG capsule Take 20 mg by mouth as needed (heartburn).    Historical Provider, MD   BP 121/77 mmHg  Pulse 68  Temp(Src) 98.6 F (37 C) (Oral)  Resp 18  SpO2 100% Physical Exam  Constitutional: She is oriented to person, place, and  time. She appears well-developed and well-nourished.  Non-toxic appearance. No distress.  HENT:  Head: Normocephalic and atraumatic.  Eyes: Conjunctivae, EOM and lids are normal. Pupils are equal, round, and reactive to light.  Neck: Normal range of motion. Neck supple. No tracheal deviation present. No thyroid mass present.  Cardiovascular: Normal rate, regular rhythm and normal heart sounds.  Exam reveals no gallop.   No murmur heard. Pulmonary/Chest: Effort normal and breath sounds normal. No stridor. No respiratory distress. She has no decreased breath sounds. She has no wheezes. She has no rhonchi. She has no rales.  Abdominal: Soft. Normal appearance and bowel sounds are normal. She exhibits no distension. There is no tenderness.  There is no rebound and no CVA tenderness.  Musculoskeletal: Normal range of motion. She exhibits no edema or tenderness.  Neurological: She is alert and oriented to person, place, and time. She has normal strength. No cranial nerve deficit or sensory deficit. GCS eye subscore is 4. GCS verbal subscore is 5. GCS motor subscore is 6.  Skin: Skin is warm and dry. No abrasion and no rash noted.  Psychiatric: She has a normal mood and affect. Her speech is normal and behavior is normal.  Nursing note and vitals reviewed.   ED Course  Procedures (including critical care time) Labs Review Labs Reviewed  CBC WITH DIFFERENTIAL/PLATELET  BASIC METABOLIC PANEL    Imaging Review No results found.   EKG Interpretation   Date/Time:  Monday May 31 2015 19:32:13 EDT Ventricular Rate:  70 PR Interval:  171 QRS Duration: 79 QT Interval:  398 QTC Calculation: 429 R Axis:   67 Text Interpretation:  Sinus rhythm No significant change since last  tracing Confirmed by Rohin Krejci  MD, Vaanya Shambaugh ([id number]) on 05/31/2015 8:18:00 PM      MDM   Final diagnoses:  Numbness   patient neurological exam is nonfocal. Head CT and blood work are within normal limits. Became referral to neurology for evaluation of possible multiple sclerosis    Lorre Nick, MD 05/31/15 2103

## 2015-05-31 NOTE — Discharge Instructions (Signed)
Return here for persistent weakness, numbness that does not go away, or any other problems. Follow-up with the neurologist.

## 2015-07-23 ENCOUNTER — Encounter (HOSPITAL_COMMUNITY): Payer: Self-pay | Admitting: Emergency Medicine

## 2015-07-23 ENCOUNTER — Emergency Department (HOSPITAL_COMMUNITY)
Admission: EM | Admit: 2015-07-23 | Discharge: 2015-07-23 | Disposition: A | Discharge status: Home / Self Care | Payer: No Typology Code available for payment source | Source: Home / Self Care | Attending: Family Medicine | Admitting: Family Medicine

## 2015-07-23 DIAGNOSIS — K2100 Gastro-esophageal reflux disease with esophagitis, without bleeding: Secondary | ICD-10-CM

## 2015-07-23 DIAGNOSIS — R1013 Epigastric pain: Secondary | ICD-10-CM

## 2015-07-23 LAB — POCT URINALYSIS DIP (DEVICE)
Bilirubin Urine: NEGATIVE
GLUCOSE, UA: NEGATIVE mg/dL
KETONES UR: NEGATIVE mg/dL
LEUKOCYTES UA: NEGATIVE
Nitrite: NEGATIVE
Protein, ur: NEGATIVE mg/dL
Urobilinogen, UA: 0.2 mg/dL (ref 0.0–1.0)
pH: 5.5 (ref 5.0–8.0)

## 2015-07-23 LAB — POCT PREGNANCY, URINE: PREG TEST UR: NEGATIVE

## 2015-07-23 MED ORDER — GI COCKTAIL ~~LOC~~
ORAL | Status: AC
  Filled 2015-07-23: qty 30

## 2015-07-23 MED ORDER — GI COCKTAIL ~~LOC~~
30.0000 mL | Freq: Once | ORAL | Status: AC
  Administered 2015-07-23: 30 mL via ORAL

## 2015-07-23 MED ORDER — PANTOPRAZOLE SODIUM 20 MG PO TBEC: 20.0000 mg | DELAYED_RELEASE_TABLET | Freq: Two times a day (BID) | ORAL | Status: DC

## 2015-07-23 NOTE — ED Provider Notes (Signed)
CSN: [id number]     Arrival date & time 07/23/15  1350 History   First MD Initiated Contact with Patient 07/23/15 1551     Chief Complaint  Patient presents with  . Abdominal Pain   (Consider location/radiation/quality/duration/timing/severity/associated sxs/prior Treatment) HPI Comments: Mrs. Fruin is a very pleasant 39 yo black female who presents with epigastric pain. She is noted to carry a history of GERD and H. Pylori now off of her omeprazole for some time. Over the last 4 days she describes pain to the upper abdomen with mild nausea. No reflux is noted. No emeses. It appears with greasy foods, but also bothersome without. No changes in bowel habits. No fevers or chills. No urinary symptoms. No past abdominal surgeries.   Patient is a 39 y.o. female presenting with abdominal pain. The history is provided by the patient.  Abdominal Pain Associated symptoms: nausea   Associated symptoms: no chills, no constipation, no diarrhea, no dysuria, no fever and no vomiting     Past Medical History  Diagnosis Date  . GERD (gastroesophageal reflux disease)   . H pylori ulcer   . Breast lump     Mammogram (last 2013)  . Sinusitis   . Herpes simplex labialis   . Palpitations 06/14/2012  . Dysrhythmia 12/16/215      NEW ONSET ATRIAL FIBRILATION  . Anxiety    Past Surgical History  Procedure Laterality Date  . Cesarean section    . Tubal ligation      2008   Family History  Problem Relation Age of Onset  . Diabetes Father    Social History  Substance Use Topics  . Smoking status: Former Smoker    Quit date: 06/15/2007  . Smokeless tobacco: Never Used  . Alcohol Use: Yes     Comment: Occasional use   OB History    Gravida Para Term Preterm AB TAB SAB Ectopic Multiple Living   Obstetric Comments   Twins c-section in 2008     Review of Systems  Constitutional: Negative for fever, chills and unexpected weight change.  Gastrointestinal: Positive for  nausea and abdominal pain. Negative for vomiting, diarrhea, constipation and blood in stool.  Genitourinary: Negative for dysuria, flank pain and difficulty urinating.  Musculoskeletal: Negative.   Skin: Negative.     Allergies  Tramadol  Home Medications   Prior to Admission medications   Medication Sig Start Date End Date Taking? Authorizing Provider  aspirin 325 MG tablet Take 325 mg by mouth every 6 (six) hours as needed for mild pain.    Historical Provider, MD  aspirin 81 MG chewable tablet Chew 1 tablet (81 mg total) by mouth daily. Patient not taking: Reported on 05/31/2015 11/26/14   Dwana Melena, PA-C  HYDROcodone-acetaminophen (NORCO/VICODIN) 5-325 MG per tablet Take 1 tablet by mouth every 4 (four) hours as needed for moderate pain or severe pain. Patient not taking: Reported on 05/31/2015 09/27/14   Trixie Dredge, PA-C  ibuprofen (ADVIL,MOTRIN) 800 MG tablet Take 1 tablet (800 mg total) by mouth every 8 (eight) hours as needed for mild pain or moderate pain. Patient not taking: Reported on 05/31/2015 09/27/14   Trixie Dredge, PA-C  metoprolol tartrate (LOPRESSOR) 25 MG tablet Take 12.5 mg by mouth daily as needed. Take it for heart rhythm    Historical Provider, MD  omeprazole (PRILOSEC) 20 MG capsule Take 20 mg by mouth as needed (heartburn).  Historical Provider, MD  pantoprazole (PROTONIX) 20 MG tablet Take 1 tablet (20 mg total) by mouth 2 (two) times daily. 07/23/15   Riki Sheer, PA-C  ranitidine (ZANTAC) 150 MG capsule Take 150 mg by mouth daily as needed for heartburn.    Historical Provider, MD   BP 109/74 mmHg  Pulse 75  Temp(Src) 98.1 F (36.7 C) (Oral)  Resp 16  SpO2 98%  LMP 07/18/2015 Physical Exam  Constitutional: She is oriented to person, place, and time. She appears well-developed and well-nourished. No distress.  HENT:  Head: Normocephalic and atraumatic.  Abdominal: Soft. Bowel sounds are normal. She exhibits no distension. There is tenderness. There  is no rebound and no guarding.  Tenderness to palpation along the epigastric region. Negative Murphy's sign. No rebound or guarding.  Neurological: She is alert and oriented to person, place, and time.  Skin: Skin is warm and dry. She is not diaphoretic.  Psychiatric: Her behavior is normal.  Nursing note and vitals reviewed.   ED Course  Procedures (including critical care time) Labs Review Labs Reviewed  POCT URINALYSIS DIP (DEVICE) - Abnormal; Notable for the following:    Hgb urine dipstick LARGE (*)    All other components within normal limits  POCT PREGNANCY, URINE    Imaging Review No results found.   MDM   1. Gastroesophageal reflux disease with esophagitis   2. Epigastric abdominal pain    Patient is on menses (hematuria).  Improved pain with GI cocktail, history of H.Pylori and Reflux/ulcers. Most likely causes. Start Protonix and Zantac x 2 weeks, then daily Protonix. F/U with PCP    Riki Sheer, PA-C 07/23/15 1654

## 2015-07-23 NOTE — Discharge Instructions (Signed)
Gastroesophageal Reflux Disease, Adult Gastroesophageal reflux disease (GERD) happens when acid from your stomach flows up into the esophagus. When acid comes in contact with the esophagus, the acid causes soreness (inflammation) in the esophagus. Over time, GERD may create small holes (ulcers) in the lining of the esophagus. CAUSES   Increased body weight. This puts pressure on the stomach, making acid rise from the stomach into the esophagus.  Smoking. This increases acid production in the stomach.  Drinking alcohol. This causes decreased pressure in the lower esophageal sphincter (valve or ring of muscle between the esophagus and stomach), allowing acid from the stomach into the esophagus.  Late evening meals and a full stomach. This increases pressure and acid production in the stomach.  A malformed lower esophageal sphincter. Sometimes, no cause is found. SYMPTOMS   Burning pain in the lower part of the mid-chest behind the breastbone and in the mid-stomach area. This may occur twice a week or more often.  Trouble swallowing.  Sore throat.  Dry cough.  Asthma-like symptoms including chest tightness, shortness of breath, or wheezing. DIAGNOSIS  Your caregiver may be able to diagnose GERD based on your symptoms. In some cases, X-rays and other tests may be done to check for complications or to check the condition of your stomach and esophagus. TREATMENT  Your caregiver may recommend over-the-counter or prescription medicines to help decrease acid production. Ask your caregiver before starting or adding any new medicines.  HOME CARE INSTRUCTIONS   Change the factors that you can control. Ask your caregiver for guidance concerning weight loss, quitting smoking, and alcohol consumption.  Avoid foods and drinks that make your symptoms worse, such as:  Caffeine or alcoholic drinks.  Chocolate.  Peppermint or mint flavorings.  Garlic and onions.  Spicy foods.  Citrus fruits,  such as oranges, lemons, or limes.  Tomato-based foods such as sauce, chili, salsa, and pizza.  Fried and fatty foods.  Avoid lying down for the 3 hours prior to your bedtime or prior to taking a nap.  Eat small, frequent meals instead of large meals.  Wear loose-fitting clothing. Do not wear anything tight around your waist that causes pressure on your stomach.  Raise the head of your bed 6 to 8 inches with wood blocks to help you sleep. Extra pillows will not help.  Only take over-the-counter or prescription medicines for pain, discomfort, or fever as directed by your caregiver.  Do not take aspirin, ibuprofen, or other nonsteroidal anti-inflammatory drugs (NSAIDs). SEEK IMMEDIATE MEDICAL CARE IF:   You have pain in your arms, neck, jaw, teeth, or back.  Your pain increases or changes in intensity or duration.  You develop nausea, vomiting, or sweating (diaphoresis).  You develop shortness of breath, or you faint.  Your vomit is green, yellow, black, or looks like coffee grounds or blood.  Your stool is red, bloody, or black. These symptoms could be signs of other problems, such as heart disease, gastric bleeding, or esophageal bleeding. MAKE SURE YOU:   Understand these instructions.  Will watch your condition.  Will get help right away if you are not doing well or get worse. Document Released: 09/06/2005 Document Revised: 02/19/2012 Document Reviewed: 06/16/2011 [facility] Patient Information 2015 [place], [place]. This information is not intended to replace advice given to you by your health care provider. Make sure you discuss any questions you have with your health care provider.   Your exam and response to GI cocktail suggest GI irritation/ulcers are likely. Treat with  Protonix 2x a day and Zantac 2x a day for 2 weeks, then continue on Protonix. Watch your diet :)

## 2015-07-23 NOTE — ED Notes (Signed)
C/o abd pain States she has right abd pain Tender to touch Has sharp pains

## 2015-07-27 ENCOUNTER — Emergency Department (HOSPITAL_COMMUNITY)
Admission: EM | Admit: 2015-07-27 | Discharge: 2015-07-27 | Disposition: A | Discharge status: Home / Self Care | Payer: No Typology Code available for payment source | Attending: Emergency Medicine | Admitting: Emergency Medicine

## 2015-07-27 ENCOUNTER — Encounter (HOSPITAL_COMMUNITY): Payer: Self-pay | Admitting: Emergency Medicine

## 2015-07-27 DIAGNOSIS — Z8619 Personal history of other infectious and parasitic diseases: Secondary | ICD-10-CM | POA: Insufficient documentation

## 2015-07-27 DIAGNOSIS — M542 Cervicalgia: Secondary | ICD-10-CM | POA: Insufficient documentation

## 2015-07-27 DIAGNOSIS — K219 Gastro-esophageal reflux disease without esophagitis: Secondary | ICD-10-CM | POA: Insufficient documentation

## 2015-07-27 DIAGNOSIS — Z7982 Long term (current) use of aspirin: Secondary | ICD-10-CM | POA: Insufficient documentation

## 2015-07-27 DIAGNOSIS — Z8659 Personal history of other mental and behavioral disorders: Secondary | ICD-10-CM | POA: Insufficient documentation

## 2015-07-27 DIAGNOSIS — M7912 Myalgia of auxiliary muscles, head and neck: Secondary | ICD-10-CM

## 2015-07-27 DIAGNOSIS — Z8679 Personal history of other diseases of the circulatory system: Secondary | ICD-10-CM | POA: Insufficient documentation

## 2015-07-27 DIAGNOSIS — Z87891 Personal history of nicotine dependence: Secondary | ICD-10-CM | POA: Insufficient documentation

## 2015-07-27 DIAGNOSIS — Z8742 Personal history of other diseases of the female genital tract: Secondary | ICD-10-CM | POA: Insufficient documentation

## 2015-07-27 MED ORDER — LIDOCAINE 5 % EX PTCH: 1.0000 | MEDICATED_PATCH | CUTANEOUS | Status: DC

## 2015-07-27 NOTE — ED Provider Notes (Signed)
CSN: [id number]     Arrival date & time 07/27/15  1033 History  This chart was scribed for non-physician practitioner, Arthor Captain, PA-C, working with Linwood Dibbles, MD by Charline Bills, ED Scribe. This patient was seen in room TR06C/TR06C and the patient's care was started at 11:10 AM.   Chief Complaint  Patient presents with  . Sore Throat   The history is provided by the patient. No language interpreter was used.   HPI Comments: Melanie Vincent is a 39 y.o. female, with a h/o GERD, who presents to the Emergency Department complaining of intermittent right-sided sore throat for the past 4 months. Pt reports pain that is "deep" within her throat and is exacerbated with palpation to the right side of her neck. Pt reports associated hoarseness. She has tried cough drops and Advil with temporary relief. She denies fever, cough, difficulty breathing, wheezing and increased pain with swallowing. Pt also denies strain or repetitive motion. She reports that her GERD is not well controlled. Pt has been seen at Urgent Care for the same and was diagnosed with sinusitis. Pt has also been seen by PCP; negative strep. Pt has never had an endoscopy done. She states that she tried to treat H pylori but was unsuccessful due to strong antibiotics.   Past Medical History  Diagnosis Date  . GERD (gastroesophageal reflux disease)   . H pylori ulcer   . Breast lump     Mammogram (last 2013)  . Sinusitis   . Herpes simplex labialis   . Palpitations 06/14/2012  . Dysrhythmia 12/16/215      NEW ONSET ATRIAL FIBRILATION  . Anxiety    Past Surgical History  Procedure Laterality Date  . Cesarean section    . Tubal ligation      2008   Family History  Problem Relation Age of Onset  . Diabetes Father    Social History  Substance Use Topics  . Smoking status: Former Smoker    Quit date: 06/15/2007  . Smokeless tobacco: Never Used  . Alcohol Use: Yes     Comment: Occasional use   OB History    Gravida  Para Term Preterm AB TAB SAB Ectopic Multiple Living   6 4 4  2 2   1 5       Obstetric Comments   Twins c-section in 2008     Review of Systems  Constitutional: Negative for fever.  HENT: Positive for sore throat and voice change. Negative for trouble swallowing.   Respiratory: Negative for cough, shortness of breath and wheezing.    Allergies  Tramadol  Home Medications   Prior to Admission medications   Medication Sig Start Date End Date Taking? Authorizing Provider  aspirin 325 MG tablet Take 325 mg by mouth every 6 (six) hours as needed for mild pain.    Historical Provider, MD  aspirin 81 MG chewable tablet Chew 1 tablet (81 mg total) by mouth daily. Patient not taking: Reported on 05/31/2015 11/26/14   Dwana Melena, PA-C  HYDROcodone-acetaminophen (NORCO/VICODIN) 5-325 MG per tablet Take 1 tablet by mouth every 4 (four) hours as needed for moderate pain or severe pain. Patient not taking: Reported on 05/31/2015 09/27/14   Trixie Dredge, PA-C  ibuprofen (ADVIL,MOTRIN) 800 MG tablet Take 1 tablet (800 mg total) by mouth every 8 (eight) hours as needed for mild pain or moderate pain. Patient not taking: Reported on 05/31/2015 09/27/14   Trixie Dredge, PA-C  metoprolol tartrate (LOPRESSOR) 25 MG tablet  Take 12.5 mg by mouth daily as needed. Take it for heart rhythm    Historical Provider, MD  omeprazole (PRILOSEC) 20 MG capsule Take 20 mg by mouth as needed (heartburn).    Historical Provider, MD  pantoprazole (PROTONIX) 20 MG tablet Take 1 tablet (20 mg total) by mouth 2 (two) times daily. 07/23/15   Riki Sheer, PA-C  ranitidine (ZANTAC) 150 MG capsule Take 150 mg by mouth daily as needed for heartburn.    Historical Provider, MD   BP 134/83 mmHg  Pulse 92  Temp(Src) 98.3 F (36.8 C) (Oral)  Resp 20  SpO2 100%  LMP 07/18/2015 Physical Exam  Constitutional: She is oriented to person, place, and time. She appears well-developed and well-nourished. No distress.  HENT:  Head:  Normocephalic and atraumatic.  Right Ear: Tympanic membrane normal.  Left Ear: Tympanic membrane normal.  Mouth/Throat: Oropharynx is clear and moist and mucous membranes are normal.  No difficulty swallowing.   Eyes: Conjunctivae and EOM are normal.  Neck: Normal range of motion. Neck supple. No tracheal deviation present. No thyroid mass and no thyromegaly present.    Tenderness to palpation over the R SCM.  Cardiovascular: Normal rate.   Pulmonary/Chest: Effort normal. No respiratory distress.  Musculoskeletal: Normal range of motion.  Lymphadenopathy:    She has no cervical adenopathy.  Neurological: She is alert and oriented to person, place, and time.  Skin: Skin is warm and dry.  Psychiatric: She has a normal mood and affect. Her behavior is normal.  Nursing note and vitals reviewed.  ED Course  Procedures (including critical care time) DIAGNOSTIC STUDIES: Oxygen Saturation is 100% on RA, normal by my interpretation.    COORDINATION OF CARE: 11:20 AM-Discussed treatment plan which includes follow-up with ENT and GI with pt at bedside and pt agreed to plan.   Labs Review Labs Reviewed - No data to display  Imaging Review No results found. I, Arthor Captain, PA-C, personally reviewed and evaluated these images and lab results as part of my medical decision-making.   EKG Interpretation None      MDM   Final diagnoses:  None   Patient with pain along the course of the right sternocleidomastoid muscle. I doubt any deep space neck infections, or abscess along the muscle pathway. This may represent some kind of mild subacute myositis. Other on the differential are reflux esophagitis, thyroiditis, globus sensation. Ears are clear. Patient will be discharged with Lidoderm patches. I discussed her case of untreated H. pylori and long-term effects of reflux. She'll be referred to the ear, nose and throat physician as well as gastroenterology for further evaluation of her  throat discomfort. She appears safe for discharge at this time. I personally performed the services described in this documentation, which was scribed in my presence. The recorded information has been reviewed and is accurate.    I personally performed the services described in this documentation, which was scribed in my presence. The recorded information has been reviewed and is accurate.      Arthor Captain, PA-C 07/27/15 1635  Linwood Dibbles, MD 07/28/15 [phone]

## 2015-07-27 NOTE — Discharge Instructions (Signed)
You have pain in the sternocleidomastoid muscle of your neck. Please follow up with the ear, nose and throat specialist as well as a gastroenterologist. You may use the Lidoderm patch on your neck up to 12 hours a day. This may provide significant relief. Return if you develop fevers, chills, difficulty swallowing or other signs of systemic infection.      Trigger Point Injection Trigger points are areas where you have muscle pain. A trigger point injection is a shot given in the trigger point to relieve that pain. A trigger point might feel like a knot in your muscle. It hurts to press on a trigger point. Sometimes the pain spreads out (radiates) to other parts of the body. For example, pressing on a trigger point in your shoulder might cause pain in your arm or neck. You might have one trigger point. Or, you might have more than one. People often have trigger points in their upper back and lower back. They also occur often in the neck and shoulders. Pain from a trigger point lasts for a long time. It can make it hard to keep moving. You might not be able to do the exercise or physical therapy that could help you deal with the pain. A trigger point injection may help. It does not work for everyone. But, it may relieve your pain for a few days or a few months. A trigger point injection does not cure long-lasting (chronic) pain. LET YOUR CAREGIVER KNOW ABOUT:  Any allergies (especially to latex, lidocaine, or steroids).  Blood-thinning medicines that you take. These drugs can lead to bleeding or bruising after an injection. They include:  Aspirin.  Ibuprofen.  Clopidogrel.  Warfarin.  Other medicines you take. This includes all vitamins, herbs, eyedrops, over-the-counter medicines, and creams.  Use of steroids.  Recent infections.  Past problems with numbing medicines.  Bleeding problems.  Surgeries you have had.  Other health problems. RISKS AND COMPLICATIONS A trigger point  injection is a safe treatment. However, problems may develop, such as:  Minor side effects usually go away in 1 to 2 days. These may include:  Soreness.  Bruising.  Stiffness.  More serious problems are rare. But, they may include:  Bleeding under the skin (hematoma).  Skin infection.  Breaking off of the needle under your skin.  Lung puncture.  The trigger point injection may not work for you. BEFORE THE PROCEDURE You may need to stop taking any medicine that thins your blood. This is to prevent bleeding and bruising. Usually these medicines are stopped several days before the injection. No other preparation is needed. PROCEDURE  A trigger point injection can be given in your caregiver's office or in a clinic. Each injection takes 2 minutes or less.  Your caregiver will feel for trigger points. The caregiver may use a marker to circle the area for the injection.  The skin over the trigger point will be washed with a germ-killing (antiseptic) solution.  The caregiver pinches the spot for the injection.  Then, a very thin needle is used for the shot. You may feel pain or a twitching feeling when the needle enters the trigger point.  A numbing solution may be injected into the trigger point. Sometimes a drug to keep down swelling, redness, and warmth (inflammation) is also injected.  Your caregiver moves the needle around the trigger zone until the tightness and twitching goes away.  After the injection, your caregiver may put gentle pressure over the injection site.  Then  it is covered with a bandage. AFTER THE PROCEDURE  You can go right home after the injection.  The bandage can be taken off after a few hours.  You may feel sore and stiff for 1 to 2 days.  Go back to your regular activities slowly. Your caregiver may ask you to stretch your muscles. Do not do anything that takes extra energy for a few days.  Follow your caregiver's instructions to manage and treat  other pain. Document Released: 11/16/2011 Document Revised: 03/24/2013 Document Reviewed: 11/16/2011 [facility] Patient Information 2015 [place], [place]. This information is not intended to replace advice given to you by your health care provider. Make sure you discuss any questions you have with your health care provider. Myositis Ossificans Myositis ossificans occurs when an untreated area of bruised muscle begins to turn into bone. The condition is relatively common in the arm or thigh. It occurs in 10% to 20% of thigh bruises (contusions). A bruise is caused by bleeding into the soft tissues. If the blood forms a clot (hematoma), it may go through a process called calcification. This turns the clot into bone. SYMPTOMS   Pain, tenderness, swelling, and warmth in the injured limb.  Feeling of fullness, deep in the injured limb.  "Black and blue" bruising of the skin.  Decreased activity (stiffness of the joints) of the injured limb. This includes loss of knee or elbow motion, depending on the area injured. CAUSES  It is unknown why certain bruises turn into bone. However, most cases result from a direct hit (trauma) that causes a severe bruise. RISK INCREASES WITH:  Contact sports, with or without pads (football, hockey, soccer, lacrosse, field hockey).  Inadequate protection of exposed areas during contact sports.  Bleeding disorders (hemophilia).  Blood thinning medicines (warfarin or aspirin).  Non-steroidal anti-inflammatory medicines (aspirin and ibuprofen) taken before the injury.  Poor nutrition, including lack of vitamins. PREVENTION  Wear properly fitted and padded protective equipment (arm or thigh pads).  If possible, limit the use of medicines that affect bleeding, before participating in contact sports. PROGNOSIS  After 3 to 6 months, the affected area typically stabilizes and becomes mature bone. At this point, most athletes feel comfortable returning to sports.  However, if pain and disability persist, surgery may be needed to remove the affected section of muscle. RELATED COMPLICATIONS   Longer healing time, if not properly treated or if not given enough time to heal.  Recurring symptoms, if activity is resumed too soon, with overuse, with a direct blow, or when using poor exercise technique.  Persistent loss of motion of the affected joint.  Weakness of the affected limb. TREATMENT Treatment first involves ice and medicine, to reduce pain and inflammation. A compression bandage may be worn to decrease swelling. It is important to perform strengthening and stretching exercises. This helps maintain muscle and prevent the muscle from turning into bone. Exercises may be done at home or with a therapist. Rarely, your caregiver will advise drawing out the fluids from the bruise through a needle (aspiration). Surgery is only considered if symptoms cause persistent pain and impairment. Surgery can only be performed once the affected area has become mature bone (6 to 12 months). MEDICATION   If pain medicine is needed, nonsteroidal anti-inflammatory medicines (aspirin and ibuprofen), or other minor pain relievers (acetaminophen), are often advised.  Do not take pain medicine for 7 days before surgery.  Prescription pain relievers are usually prescribed only after surgery. Use only as directed and only  as much as you need.  Ointments applied to the skin may be helpful. HEAT AND COLD  Cold treatment (icing) should be applied for 10 to 15 minutes every 2 to 3 hours for inflammation and pain, and immediately after activity that aggravates your symptoms. Use ice packs or an ice massage.  Heat treatment may be used before performing stretching and strengthening activities prescribed by your caregiver, physical therapist, or athletic trainer. Use a heat pack or a warm water soak. SEEK MEDICAL CARE IF:   Symptoms get worse or do not improve in 6 weeks, despite  treatment.  New, unexplained symptoms develop. (Drugs used in treatment may produce side effects.). Document Released: 11/27/2005 Document Revised: 02/19/2012 Document Reviewed: 03/11/2009 [facility] Patient Information 2015 [place], [place]. This information is not intended to replace advice given to you by your health care provider. Make sure you discuss any questions you have with your health care provider.

## 2015-07-27 NOTE — ED Notes (Signed)
Patient states sore throat x 4 months and has been to urgent care x several visits and primary care.   Patient states has some ear pain with the throat pain.

## 2015-08-26 ENCOUNTER — Encounter (HOSPITAL_COMMUNITY): Payer: Self-pay | Admitting: Emergency Medicine

## 2015-08-26 ENCOUNTER — Emergency Department (HOSPITAL_COMMUNITY)
Admission: EM | Admit: 2015-08-26 | Discharge: 2015-08-26 | Disposition: A | Discharge status: Home / Self Care | Payer: No Typology Code available for payment source | Source: Home / Self Care | Attending: Family Medicine | Admitting: Family Medicine

## 2015-08-26 DIAGNOSIS — H6981 Other specified disorders of Eustachian tube, right ear: Secondary | ICD-10-CM

## 2015-08-26 DIAGNOSIS — J302 Other seasonal allergic rhinitis: Secondary | ICD-10-CM

## 2015-08-26 DIAGNOSIS — H6991 Unspecified Eustachian tube disorder, right ear: Secondary | ICD-10-CM

## 2015-08-26 DIAGNOSIS — S161XXA Strain of muscle, fascia and tendon at neck level, initial encounter: Secondary | ICD-10-CM

## 2015-08-26 DIAGNOSIS — R0982 Postnasal drip: Secondary | ICD-10-CM

## 2015-08-26 DIAGNOSIS — T700XXD Otitic barotrauma, subsequent encounter: Secondary | ICD-10-CM

## 2015-08-26 MED ORDER — PREDNISONE 20 MG PO TABS: ORAL_TABLET | ORAL | Status: DC

## 2015-08-26 MED ORDER — DICLOFENAC SODIUM 1 % TD GEL: 1.0000 "application " | Freq: Four times a day (QID) | TRANSDERMAL | Status: DC

## 2015-08-26 NOTE — ED Provider Notes (Signed)
CSN: [id number]     Arrival date & time 08/26/15  1301 History   First MD Initiated Contact with Patient 08/26/15 1340     Chief Complaint  Patient presents with  . Sore Throat  . Neck Pain   (Consider location/radiation/quality/duration/timing/severity/associated sxs/prior Treatment) HPI Comments: 39 year old female is complaining of "sinus infection issues" for the past 4 months. She states that over the years she has had recurrent episodes of sinus problems. For the past 4 months she has been to her primary care doctor which she no longer can see due to insurance, urgent care in the ED for neck pain and sinus symptoms. She complains of sore throat, right earache, pain with swallowing, PND. She is not taking any medications for her symptoms. There is tenderness along the area behind the right angle of the jaw just inferior to the ear. No nodules or swelling is observed or palpated.  She is also complaining of right lateral neck pai and pain behind the right jaw. The pain in the right neck is located in the sternocleidomastoid muscle and a portion of the splenius capitis. The pain is greatest at the insertion point of the occiput and radiates anteriorly to the origin. These muscles are tender and pain is reproducible with palpation and rotation of the head. She states she has been told before that the sternocleidomastoid muscle is a source of her right neck pain when seen by other providers.    Past Medical History  Diagnosis Date  . GERD (gastroesophageal reflux disease)   . H pylori ulcer   . Breast lump     Mammogram (last 2013)  . Sinusitis   . Herpes simplex labialis   . Palpitations 06/14/2012  . Dysrhythmia 12/16/215      NEW ONSET ATRIAL FIBRILATION  . Anxiety    Past Surgical History  Procedure Laterality Date  . Cesarean section    . Tubal ligation      2008   Family History  Problem Relation Age of Onset  . Diabetes Father    Social History  Substance Use Topics  .  Smoking status: Former Smoker    Quit date: 06/15/2007  . Smokeless tobacco: Never Used  . Alcohol Use: Yes     Comment: Occasional use   OB History    Gravida Para Term Preterm AB TAB SAB Ectopic Multiple Living   Obstetric Comments   Twins c-section in 2008     Review of Systems  Constitutional: Negative for fever, chills, activity change, appetite change and fatigue.  HENT: Positive for postnasal drip and sore throat. Negative for congestion, facial swelling, rhinorrhea, sinus pressure and trouble swallowing.   Eyes: Negative.   Respiratory: Negative.  Negative for cough and shortness of breath.   Cardiovascular: Negative.   Gastrointestinal: Negative.   Genitourinary: Negative.   Musculoskeletal: Negative for neck pain and neck stiffness.  Skin: Negative for pallor and rash.  Neurological: Negative.   Psychiatric/Behavioral: Negative.     Allergies  Tramadol  Home Medications   Prior to Admission medications   Medication Sig Start Date End Date Taking? Authorizing Provider  aspirin 325 MG tablet Take 325 mg by mouth every 6 (six) hours as needed for mild pain.    Historical Provider, MD  aspirin 81 MG chewable tablet Chew 1 tablet (81 mg total) by mouth daily. Patient not taking: Reported on 05/31/2015 11/26/14   Judie Grieve  Otho Bellows, PA-C  diclofenac sodium (VOLTAREN) 1 % GEL Apply 1 application topically 4 (four) times daily. 08/26/15   Hayden Rasmussen, NP  lidocaine (LIDODERM) 5 % Place 1 patch onto the skin daily. Remove & Discard patch within 12 hours or as directed by MD 07/27/15   Arthor Captain, PA-C  metoprolol tartrate (LOPRESSOR) 25 MG tablet Take 12.5 mg by mouth daily as needed. Take it for heart rhythm    Historical Provider, MD  omeprazole (PRILOSEC) 20 MG capsule Take 20 mg by mouth as needed (heartburn).    Historical Provider, MD  pantoprazole (PROTONIX) 20 MG tablet Take 1 tablet (20 mg total) by mouth 2 (two) times daily. 07/23/15   Riki Sheer, PA-C  predniSONE (DELTASONE) 20 MG tablet Take 3 tabs po on first day, 2 tabs second day, 2 tabs third day, 1 tab fourth day, 1 tab 5th day. Take with food. 08/26/15   Hayden Rasmussen, NP  ranitidine (ZANTAC) 150 MG capsule Take 150 mg by mouth daily as needed for heartburn.    Historical Provider, MD   Meds Ordered and Administered this Visit  Medications - No data to display  LMP 08/12/2015 No data found.   Physical Exam  Constitutional: She is oriented to person, place, and time. She appears well-developed and well-nourished. No distress.  HENT:  Head: Normocephalic and atraumatic.  Mouth/Throat: No oropharyngeal exudate.  Left and right TMs are normal. No retraction, discoloration, erythema or evidence of infection. Oropharynx with clear PND and minor cobblestoning. No exudates. No swelling or edema.  Eyes: Conjunctivae and EOM are normal.  Neck: Normal range of motion. Neck supple.  There is tenderness along the sternocleidomastoid muscle as well as portion of the right splenius capitis. This pain is reproduced with patient head movement that both contracts a muscle and over stretches it. No palpable cervical nodes. No palpable masses. No asymmetry to the anterior neck. No posterior nodes palpable. No discoloration or swelling observed or palpated.  Cardiovascular: Normal rate, regular rhythm, normal heart sounds and intact distal pulses.   Pulmonary/Chest: Effort normal and breath sounds normal. No respiratory distress. She has no wheezes. She has no rales.  Musculoskeletal: She exhibits no edema.  Lymphadenopathy:    She has no cervical adenopathy.  Neurological: She is alert and oriented to person, place, and time. She exhibits normal muscle tone.  Skin: Skin is warm. No rash noted.  Psychiatric: She has a normal mood and affect. Her behavior is normal.  Nursing note and vitals reviewed.   ED Course  Procedures (including critical care time)  Labs Review Labs Reviewed -  No data to display  Imaging Review No results found.   Visual Acuity Review  Right Eye Distance:   Left Eye Distance:   Bilateral Distance:    Right Eye Near:   Left Eye Near:    Bilateral Near:         MDM   1. PND (post-nasal drip)   2. Other seasonal allergic rhinitis   3. Strain of sternocleidomastoid muscle, initial encounter   4. Barotitis media, subsequent encounter   5. ETD (eustachian tube dysfunction), right    Flonase or rhinocort nasal spray Claritin or Allegra daily Prednisone taper dose Take with food Lots of nasal saline often For congestion sudafed PE 10 mg Lots of fluids    Hayden Rasmussen, NP 08/26/15 1413  Hayden Rasmussen, NP 08/26/15 1414

## 2015-08-26 NOTE — Discharge Instructions (Signed)
Allergic Rhinitis Flonase or rhinocort nasal spray Claritin or Allegra daily Prednisone taper dose Lots of nasal saline often For congestion sudafed PE 10 mg Allergic rhinitis is when the mucous membranes in the nose respond to allergens. Allergens are particles in the air that cause your body to have an allergic reaction. This causes you to release allergic antibodies. Through a chain of events, these eventually cause you to release histamine into the blood stream. Although meant to protect the body, it is this release of histamine that causes your discomfort, such as frequent sneezing, congestion, and an itchy, runny nose.  CAUSES  Seasonal allergic rhinitis (hay fever) is caused by pollen allergens that may come from grasses, trees, and weeds. Year-round allergic rhinitis (perennial allergic rhinitis) is caused by allergens such as house dust mites, pet dander, and mold spores.  SYMPTOMS   Nasal stuffiness (congestion).  Itchy, runny nose with sneezing and tearing of the eyes. DIAGNOSIS  Your health care provider can help you determine the allergen or allergens that trigger your symptoms. If you and your health care provider are unable to determine the allergen, skin or blood testing may be used. TREATMENT  Allergic rhinitis does not have a cure, but it can be controlled by:  Medicines and allergy shots (immunotherapy).  Avoiding the allergen. Hay fever may often be treated with antihistamines in pill or nasal spray forms. Antihistamines block the effects of histamine. There are over-the-counter medicines that may help with nasal congestion and swelling around the eyes. Check with your health care provider before taking or giving this medicine.  If avoiding the allergen or the medicine prescribed do not work, there are many new medicines your health care provider can prescribe. Stronger medicine may be used if initial measures are ineffective. Desensitizing injections can be used if  medicine and avoidance does not work. Desensitization is when a patient is given ongoing shots until the body becomes less sensitive to the allergen. Make sure you follow up with your health care provider if problems continue. HOME CARE INSTRUCTIONS It is not possible to completely avoid allergens, but you can reduce your symptoms by taking steps to limit your exposure to them. It helps to know exactly what you are allergic to so that you can avoid your specific triggers. SEEK MEDICAL CARE IF:   You have a fever.  You develop a cough that does not stop easily (persistent).  You have shortness of breath.  You start wheezing.  Symptoms interfere with normal daily activities. Document Released: 08/22/2001 Document Revised: 12/02/2013 Document Reviewed: 08/04/2013 [facility] Patient Information 2015 [place], [place]. This information is not intended to replace advice given to you by your health care provider. Make sure you discuss any questions you have with your health care provider.  Barotitis Media Barotitis media is inflammation of your middle ear. This occurs when the auditory tube (eustachian tube) leading from the back of your nose (nasopharynx) to your eardrum is blocked. This blockage may result from a cold, environmental allergies, or an upper respiratory infection. Unresolved barotitis media may lead to damage or hearing loss (barotrauma), which may become permanent. HOME CARE INSTRUCTIONS   Use medicines as recommended by your health care provider. Over-the-counter medicines will help unblock the canal and can help during times of air travel.  Do not put anything into your ears to clean or unplug them. Eardrops will not be helpful.  Do not swim, dive, or fly until your health care provider says it is all right to  do so. If these activities are necessary, chewing gum with frequent, forceful swallowing may help. It is also helpful to hold your nose and gently blow to pop your ears for  equalizing pressure changes. This forces air into the eustachian tube.  Only take over-the-counter or prescription medicines for pain, discomfort, or fever as directed by your health care provider.  A decongestant may be helpful in decongesting the middle ear and make pressure equalization easier. SEEK MEDICAL CARE IF:  You experience a serious form of dizziness in which you feel as if the room is spinning and you feel nauseated (vertigo).  Your symptoms only involve one ear. SEEK IMMEDIATE MEDICAL CARE IF:   You develop a severe headache, dizziness, or severe ear pain.  You have bloody or pus-like drainage from your ears.  You develop a fever.  Your problems do not improve or become worse. MAKE SURE YOU:   Understand these instructions.  Will watch your condition.  Will get help right away if you are not doing well or get worse. Document Released: 11/24/2000 Document Revised: 09/17/2013 Document Reviewed: 06/24/2013 [facility] Patient Information 2015 [place], [place]. This information is not intended to replace advice given to you by your health care provider. Make sure you discuss any questions you have with your health care provider.  Muscle Strain Heat and diclofenac gel 4 times a day A muscle strain is an injury that occurs when a muscle is stretched beyond its normal length. Usually a small number of muscle fibers are torn when this happens. Muscle strain is rated in degrees. First-degree strains have the least amount of muscle fiber tearing and pain. Second-degree and third-degree strains have increasingly more tearing and pain.  Usually, recovery from muscle strain takes 1-2 weeks. Complete healing takes 5-6 weeks.  CAUSES  Muscle strain happens when a sudden, violent force placed on a muscle stretches it too far. This may occur with lifting, sports, or a fall.  RISK FACTORS Muscle strain is especially common in athletes.  SIGNS AND SYMPTOMS At the site of the muscle  strain, there may be:  Pain.  Bruising.  Swelling.  Difficulty using the muscle due to pain or lack of normal function. DIAGNOSIS  Your health care provider will perform a physical exam and ask about your medical history. TREATMENT  Often, the best treatment for a muscle strain is resting, icing, and applying cold compresses to the injured area.  HOME CARE INSTRUCTIONS   Use the PRICE method of treatment to promote muscle healing during the first 2-3 days after your injury. The PRICE method involves:  Protecting the muscle from being injured again.  Restricting your activity and resting the injured body part.  Icing your injury. To do this, put ice in a plastic bag. Place a towel between your skin and the bag. Then, apply the ice and leave it on from 15-20 minutes each hour. After the third day, switch to moist heat packs.  Apply compression to the injured area with a splint or elastic bandage. Be careful not to wrap it too tightly. This may interfere with blood circulation or increase swelling.  Elevate the injured body part above the level of your heart as often as you can.  Only take over-the-counter or prescription medicines for pain, discomfort, or fever as directed by your health care provider.  Warming up prior to exercise helps to prevent future muscle strains. SEEK MEDICAL CARE IF:   You have increasing pain or swelling in the injured area.  You have numbness, tingling, or a significant loss of strength in the injured area. MAKE SURE YOU:   Understand these instructions.  Will watch your condition.  Will get help right away if you are not doing well or get worse. Document Released: 11/27/2005 Document Revised: 09/17/2013 Document Reviewed: 06/26/2013 [facility] Patient Information 2015 [place], [place]. This information is not intended to replace advice given to you by your health care provider. Make sure you discuss any questions you have with your health care  provider.  Sinusitis Sinusitis is redness, soreness, and puffiness (inflammation) of the air pockets in the bones of your face (sinuses). The redness, soreness, and puffiness can cause air and mucus to get trapped in your sinuses. This can allow germs to grow and cause an infection.  HOME CARE   Drink enough fluids to keep your pee (urine) clear or pale yellow.  Use a humidifier in your home.  Run a hot shower to create steam in the bathroom. Sit in the bathroom with the door closed. Breathe in the steam 3-4 times a day.  Put a warm, moist washcloth on your face 3-4 times a day, or as told by your doctor.  Use salt water sprays (saline sprays) to wet the thick fluid in your nose. This can help the sinuses drain.  Only take medicine as told by your doctor. GET HELP RIGHT AWAY IF:   Your pain gets worse.  You have very bad headaches.  You are sick to your stomach (nauseous).  You throw up (vomit).  You are very sleepy (drowsy) all the time.  Your face is puffy (swollen).  Your vision changes.  You have a stiff neck.  You have trouble breathing. MAKE SURE YOU:   Understand these instructions.  Will watch your condition.  Will get help right away if you are not doing well or get worse. Document Released: 05/15/2008 Document Revised: 08/21/2012 Document Reviewed: 07/02/2012 [facility] Patient Information 2015 [place], [place]. This information is not intended to replace advice given to you by your health care provider. Make sure you discuss any questions you have with your health care provider.

## 2015-08-26 NOTE — ED Notes (Signed)
Sore throat, right ear ache, "swoollen glands" per patient.  Reports a four month history of issues.  Has been seen at u[facility], at pcp, at ed and symptoms continue.  Has referral for ent, but only has orange card, and an appointment is at the discretion of ent office.

## 2015-08-30 ENCOUNTER — Inpatient Hospital Stay (HOSPITAL_COMMUNITY)
Admission: AD | Admit: 2015-08-30 | Discharge: 2015-08-30 | Disposition: A | Discharge status: Home / Self Care | Payer: No Typology Code available for payment source | Source: Ambulatory Visit | Attending: Family Medicine | Admitting: Family Medicine

## 2015-08-30 ENCOUNTER — Inpatient Hospital Stay (HOSPITAL_COMMUNITY): Payer: No Typology Code available for payment source

## 2015-08-30 ENCOUNTER — Encounter (HOSPITAL_COMMUNITY): Payer: Self-pay | Admitting: *Deleted

## 2015-08-30 DIAGNOSIS — N8320 Unspecified ovarian cysts: Secondary | ICD-10-CM | POA: Insufficient documentation

## 2015-08-30 DIAGNOSIS — R102 Pelvic and perineal pain: Secondary | ICD-10-CM

## 2015-08-30 DIAGNOSIS — Z7982 Long term (current) use of aspirin: Secondary | ICD-10-CM | POA: Insufficient documentation

## 2015-08-30 DIAGNOSIS — R1031 Right lower quadrant pain: Secondary | ICD-10-CM | POA: Insufficient documentation

## 2015-08-30 DIAGNOSIS — Z87891 Personal history of nicotine dependence: Secondary | ICD-10-CM | POA: Insufficient documentation

## 2015-08-30 LAB — URINALYSIS, ROUTINE W REFLEX MICROSCOPIC
BILIRUBIN URINE: NEGATIVE
GLUCOSE, UA: NEGATIVE mg/dL
Hgb urine dipstick: NEGATIVE
Ketones, ur: NEGATIVE mg/dL
Leukocytes, UA: NEGATIVE
NITRITE: NEGATIVE
PH: 6 (ref 5.0–8.0)
Protein, ur: NEGATIVE mg/dL
Urobilinogen, UA: 0.2 mg/dL (ref 0.0–1.0)

## 2015-08-30 LAB — WET PREP, GENITAL
Clue Cells Wet Prep HPF POC: NONE SEEN
Trich, Wet Prep: NONE SEEN
YEAST WET PREP: NONE SEEN

## 2015-08-30 LAB — POCT PREGNANCY, URINE: Preg Test, Ur: NEGATIVE

## 2015-08-30 NOTE — MAU Note (Signed)
Pt presents to MAU with complaints of having grey tissue on her toilet paper when she wipes that started a week ago. Reports pain in her right side

## 2015-08-30 NOTE — Discharge Instructions (Signed)
Ovarian Cyst An ovarian cyst is a fluid-filled sac that forms on an ovary. The ovaries are small organs that produce eggs in women. Various types of cysts can form on the ovaries. Most are not cancerous. Many do not cause problems, and they often go away on their own. Some may cause symptoms and require treatment. Common types of ovarian cysts include:  Functional cysts--These cysts may occur every month during the menstrual cycle. This is normal. The cysts usually go away with the next menstrual cycle if the woman does not get pregnant. Usually, there are no symptoms with a functional cyst.  Endometrioma cysts--These cysts form from the tissue that lines the uterus. They are also called "chocolate cysts" because they become filled with blood that turns brown. This type of cyst can cause pain in the lower abdomen during intercourse and with your menstrual period.  Cystadenoma cysts--This type develops from the cells on the outside of the ovary. These cysts can get very big and cause lower abdomen pain and pain with intercourse. This type of cyst can twist on itself, cut off its blood supply, and cause severe pain. It can also easily rupture and cause a lot of pain.  Dermoid cysts--This type of cyst is sometimes found in both ovaries. These cysts may contain different kinds of body tissue, such as skin, teeth, hair, or cartilage. They usually do not cause symptoms unless they get very big.  Theca lutein cysts--These cysts occur when too much of a certain hormone (human chorionic gonadotropin) is produced and overstimulates the ovaries to produce an egg. This is most common after procedures used to assist with the conception of a baby (in vitro fertilization). CAUSES   Fertility drugs can cause a condition in which multiple large cysts are formed on the ovaries. This is called ovarian hyperstimulation syndrome.  A condition called polycystic ovary syndrome can cause hormonal imbalances that can lead to  nonfunctional ovarian cysts. SIGNS AND SYMPTOMS  Many ovarian cysts do not cause symptoms. If symptoms are present, they may include:  Pelvic pain or pressure.  Pain in the lower abdomen.  Pain during sexual intercourse.  Increasing girth (swelling) of the abdomen.  Abnormal menstrual periods.  Increasing pain with menstrual periods.  Stopping having menstrual periods without being pregnant. DIAGNOSIS  These cysts are commonly found during a routine or annual pelvic exam. Tests may be ordered to find out more about the cyst. These tests may include:  Ultrasound.  X-ray of the pelvis.  CT scan.  MRI.  Blood tests. TREATMENT  Many ovarian cysts go away on their own without treatment. Your health care provider may want to check your cyst regularly for 2-3 months to see if it changes. For women in menopause, it is particularly important to monitor a cyst closely because of the higher rate of ovarian cancer in menopausal women. When treatment is needed, it may include any of the following:  A procedure to drain the cyst (aspiration). This may be done using a long needle and ultrasound. It can also be done through a laparoscopic procedure. This involves using a thin, lighted tube with a tiny camera on the end (laparoscope) inserted through a small incision.  Surgery to remove the whole cyst. This may be done using laparoscopic surgery or an open surgery involving a larger incision in the lower abdomen.  Hormone treatment or birth control pills. These methods are sometimes used to help dissolve a cyst. HOME CARE INSTRUCTIONS   Only take over-the-counter   or prescription medicines as directed by your health care provider.  Follow up with your health care provider as directed.  Get regular pelvic exams and Pap tests. SEEK MEDICAL CARE IF:   Your periods are late, irregular, or painful, or they stop.  Your pelvic pain or abdominal pain does not go away.  Your abdomen becomes  larger or swollen.  You have pressure on your bladder or trouble emptying your bladder completely.  You have pain during sexual intercourse.  You have feelings of fullness, pressure, or discomfort in your stomach.  You lose weight for no apparent reason.  You feel generally ill.  You become constipated.  You lose your appetite.  You develop acne.  You have an increase in body and facial hair.  You are gaining weight, without changing your exercise and eating habits.  You think you are pregnant. SEEK IMMEDIATE MEDICAL CARE IF:   You have increasing abdominal pain.  You feel sick to your stomach (nauseous), and you throw up (vomit).  You develop a fever that comes on suddenly.  You have abdominal pain during a bowel movement.  Your menstrual periods become heavier than usual. MAKE SURE YOU:  Understand these instructions.  Will watch your condition.  Will get help right away if you are not doing well or get worse. Document Released: 11/27/2005 Document Revised: 12/02/2013 Document Reviewed: 08/04/2013 Exit[employer] Patient Information 2015 [employer]. This information is not intended to replace advice given to you by your health care provider. Make sure you discuss any questions you have with your health care provider.  

## 2015-08-30 NOTE — MAU Provider Note (Signed)
History   [id number] with hx of BTL in with c/o lower right quad pain for over a week also passing little bits of gray tissue when she wipes. Last pap was in April and was normal.   CSN: [id number]  Arrival date and time: 08/30/15 1440   None     No chief complaint on file.  HPI  OB History    Gravida Para Term Preterm AB TAB SAB Ectopic Multiple Living   Obstetric Comments   Twins c-section in 2008      Past Medical History  Diagnosis Date  . GERD (gastroesophageal reflux disease)   . H pylori ulcer   . Breast lump     Mammogram (last 2013)  . Sinusitis   . Herpes simplex labialis   . Palpitations 06/14/2012  . Dysrhythmia 12/16/215      NEW ONSET ATRIAL FIBRILATION  . Anxiety     Past Surgical History  Procedure Laterality Date  . Cesarean section    . Tubal ligation      2008    Family History  Problem Relation Age of Onset  . Diabetes Father     Social History  Substance Use Topics  . Smoking status: Former Smoker    Quit date: 06/15/2007  . Smokeless tobacco: Never Used  . Alcohol Use: Yes     Comment: Occasional use    Allergies:  Allergies  Allergen Reactions  . Tramadol Other (See Comments)    Pt states that this medication causes insomnia and for her to have "crazy thoughts".     Prescriptions prior to admission  Medication Sig Dispense Refill Last Dose  . aspirin 325 MG tablet Take 325 mg by mouth every 6 (six) hours as needed for mild pain.   Unknown at Unknown time  . aspirin 81 MG chewable tablet Chew 1 tablet (81 mg total) by mouth daily. (Patient not taking: Reported on 05/31/2015)   Unknown at Unknown time  . diclofenac sodium (VOLTAREN) 1 % GEL Apply 1 application topically 4 (four) times daily. 100 g 0   . lidocaine (LIDODERM) 5 % Place 1 patch onto the skin daily. Remove & Discard patch within 12 hours or as directed by MD 30 patch 0 Unknown at Unknown time  . metoprolol tartrate (LOPRESSOR) 25 MG tablet Take  12.5 mg by mouth daily as needed. Take it for heart rhythm   Unknown at Unknown time  . omeprazole (PRILOSEC) 20 MG capsule Take 20 mg by mouth as needed (heartburn).   Unknown at Unknown time  . pantoprazole (PROTONIX) 20 MG tablet Take 1 tablet (20 mg total) by mouth 2 (two) times daily. 60 tablet 2 Unknown at Unknown time  . predniSONE (DELTASONE) 20 MG tablet Take 3 tabs po on first day, 2 tabs second day, 2 tabs third day, 1 tab fourth day, 1 tab 5th day. Take with food. 9 tablet 0   . ranitidine (ZANTAC) 150 MG capsule Take 150 mg by mouth daily as needed for heartburn.   Unknown at Unknown time    Review of Systems  Constitutional: Negative.   HENT: Negative.   Eyes: Negative.   Respiratory: Negative.   Cardiovascular: Negative.   Gastrointestinal: Positive for abdominal pain.  Genitourinary: Negative.   Musculoskeletal: Negative.   Skin: Negative.   Neurological: Negative.   Endo/Heme/Allergies: Negative.   Psychiatric/Behavioral: Negative.    Physical Exam  Blood pressure 128/71, pulse 96, temperature 98.1 F (36.7 C), resp. rate 18, last menstrual period 08/12/2015.  Physical Exam  Constitutional: She is oriented to person, place, and time. She appears well-developed and well-nourished.  HENT:  Head: Normocephalic.  Neck: Normal range of motion.  Cardiovascular: Normal rate, regular rhythm, normal heart sounds and intact distal pulses.   Respiratory: Effort normal and breath sounds normal.  GI: Soft. Bowel sounds are normal. There is tenderness.  Genitourinary: Uterus normal.  White clumpy d/c present.  Musculoskeletal: Normal range of motion.  Neurological: She is alert and oriented to person, place, and time. She has normal reflexes.  Skin: Skin is warm and dry.  Psychiatric: She has a normal mood and affect. Her behavior is normal. Judgment and thought content normal.    MAU Course  Procedures  MDM Simple right ovarian cyst  Assessment and Plan  Sterile  spec exam wet prep and GC and chla done. Cervix smooth,pink, bulbous, normal in appearance. White clumpy d/c present. Mild abd tenderness right adenexa. Will get pelvic u/s. Discussed findings.pt wants no intervention  LAWSON, MARIE DARLENE 08/30/2015, 3:06 PM

## 2015-08-31 LAB — GC/CHLAMYDIA PROBE AMP (~~LOC~~) NOT AT ARMC
Chlamydia: NEGATIVE
Neisseria Gonorrhea: NEGATIVE

## 2015-09-15 ENCOUNTER — Encounter (HOSPITAL_COMMUNITY): Payer: Self-pay | Admitting: *Deleted

## 2015-09-15 ENCOUNTER — Emergency Department (HOSPITAL_COMMUNITY): Payer: No Typology Code available for payment source

## 2015-09-15 ENCOUNTER — Emergency Department (HOSPITAL_COMMUNITY)
Admission: EM | Admit: 2015-09-15 | Discharge: 2015-09-15 | Disposition: A | Discharge status: Home / Self Care | Payer: No Typology Code available for payment source | Attending: Emergency Medicine | Admitting: Emergency Medicine

## 2015-09-15 DIAGNOSIS — Z7982 Long term (current) use of aspirin: Secondary | ICD-10-CM | POA: Insufficient documentation

## 2015-09-15 DIAGNOSIS — F411 Generalized anxiety disorder: Secondary | ICD-10-CM | POA: Diagnosis present

## 2015-09-15 DIAGNOSIS — Z8619 Personal history of other infectious and parasitic diseases: Secondary | ICD-10-CM | POA: Insufficient documentation

## 2015-09-15 DIAGNOSIS — K219 Gastro-esophageal reflux disease without esophagitis: Secondary | ICD-10-CM | POA: Insufficient documentation

## 2015-09-15 DIAGNOSIS — Z79899 Other long term (current) drug therapy: Secondary | ICD-10-CM | POA: Insufficient documentation

## 2015-09-15 DIAGNOSIS — Z87891 Personal history of nicotine dependence: Secondary | ICD-10-CM | POA: Insufficient documentation

## 2015-09-15 DIAGNOSIS — I4891 Unspecified atrial fibrillation: Secondary | ICD-10-CM

## 2015-09-15 DIAGNOSIS — F419 Anxiety disorder, unspecified: Secondary | ICD-10-CM | POA: Insufficient documentation

## 2015-09-15 DIAGNOSIS — Z3202 Encounter for pregnancy test, result negative: Secondary | ICD-10-CM | POA: Insufficient documentation

## 2015-09-15 DIAGNOSIS — I480 Paroxysmal atrial fibrillation: Secondary | ICD-10-CM | POA: Insufficient documentation

## 2015-09-15 LAB — D-DIMER, QUANTITATIVE: D-Dimer, Quant: 0.42 ug/mL-FEU (ref 0.00–0.48)

## 2015-09-15 LAB — CBC
HCT: 38.8 % (ref 36.0–46.0)
Hemoglobin: 12.8 g/dL (ref 12.0–15.0)
MCH: 29.4 pg (ref 26.0–34.0)
MCHC: 33 g/dL (ref 30.0–36.0)
MCV: 89 fL (ref 78.0–100.0)
Platelets: 368 10*3/uL (ref 150–400)
RBC: 4.36 MIL/uL (ref 3.87–5.11)
RDW: 13.6 % (ref 11.5–15.5)
WBC: 9 10*3/uL (ref 4.0–10.5)

## 2015-09-15 LAB — BASIC METABOLIC PANEL
Anion gap: 12 (ref 5–15)
BUN: 13 mg/dL (ref 6–20)
CHLORIDE: 107 mmol/L (ref 101–111)
CO2: 18 mmol/L — ABNORMAL LOW (ref 22–32)
CREATININE: 0.71 mg/dL (ref 0.44–1.00)
Calcium: 9.2 mg/dL (ref 8.9–10.3)
GFR calc Af Amer: >60 mL/min (ref >60)
GFR calc non Af Amer: >60 mL/min (ref >60)
Glucose, Bld: 98 mg/dL (ref 65–99)
Potassium: 4.1 mmol/L (ref 3.5–5.1)
Sodium: 137 mmol/L (ref 135–145)

## 2015-09-15 LAB — PROTIME-INR
INR: 1.02 (ref 0.00–1.49)
Prothrombin Time: 13.6 seconds (ref 11.6–15.2)

## 2015-09-15 LAB — I-STAT TROPONIN, ED: Troponin i, poc: 0 ng/mL (ref 0.00–0.08)

## 2015-09-15 LAB — POC URINE PREG, ED: Preg Test, Ur: NEGATIVE

## 2015-09-15 MED ORDER — DILTIAZEM HCL 25 MG/5ML IV SOLN
10.0000 mg | Freq: Once | INTRAVENOUS | Status: AC
  Administered 2015-09-15: 10 mg via INTRAVENOUS
  Filled 2015-09-15: qty 5

## 2015-09-15 MED ORDER — ASPIRIN 81 MG PO CHEW
324.0000 mg | CHEWABLE_TABLET | Freq: Once | ORAL | Status: AC
  Administered 2015-09-15: 324 mg via ORAL
  Filled 2015-09-15: qty 4

## 2015-09-15 MED ORDER — FLECAINIDE ACETATE 100 MG PO TABS
300.0000 mg | ORAL_TABLET | Freq: Once | ORAL | Status: AC
  Administered 2015-09-15: 300 mg via ORAL
  Filled 2015-09-15: qty 3

## 2015-09-15 MED ORDER — FLECAINIDE ACETATE 150 MG PO TABS: 300.0000 mg | ORAL_TABLET | Freq: Two times a day (BID) | ORAL | Status: DC

## 2015-09-15 MED ORDER — ONDANSETRON HCL 4 MG/2ML IJ SOLN
4.0000 mg | Freq: Once | INTRAMUSCULAR | Status: AC
  Administered 2015-09-15: 4 mg via INTRAVENOUS
  Filled 2015-09-15: qty 2

## 2015-09-15 NOTE — Consult Note (Signed)
Consult   Patient ID: Melanie Vincent MRN: [id number], DOB/AGE: 39-Feb-1977 39 y.o. Date of Encounter: 09/15/2015  Primary Physician: [facility] Primary Cardiologist: Dr Antoine Poche  Chief Complaint:  Afib  HPI: Melanie Vincent is a 39 y.o. female with a history of PAF, GERD, anxiety, costochondritis. Echo 2015 was OK.   Melanie Vincent has a great deal stress in her life due to her social situation. 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 She also has a fianc. She knows that she has had anxiety all her life, but feels that she has been more stressed recently because of various things. She is sleeping poorly and waking up in the middle of the night afraid she has missed her alarm. She has had one drink in the last week, rarely eats chocolate and drinks minimal caffeinated beverages. She avoids over-the-counter cold medications and anything with pseudoephedrine in it.  Melanie Vincent was in her usual state of health yesterday. She was at work and felt her heart "turn over". She took her pulse and noted a pause but then stated her heart rate was regular and did not think anything more about it. She was having no pain or discomfort.  She slept poorly last night and was awake multiple times. When she woke up this morning, she knew she was in atrial fibrillation. She does not think she was in it when she went to bed last night. She feels the palpitations, and they make her a little lightheaded. Additionally, with the palpitations, she feels chest discomfort, a fullness or pressure. It is a 5/10. It does not change with deep inspiration, but her upper chest wall just left of the sternum is a little tender. 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   Past Medical History  Diagnosis Date   . GERD (gastroesophageal reflux disease)   . H pylori ulcer   . Breast lump     Mammogram (last 2013)  . Sinusitis   . Herpes simplex labialis   . Palpitations 06/14/2012  . Dysrhythmia 12/16/215      NEW ONSET ATRIAL FIBRILATION  . Anxiety     Surgical History:  Past Surgical History  Procedure Laterality Date  . Cesarean section    . Tubal ligation      2008     I have reviewed the patient's current medications. Prior to Admission medications   Medication Sig Start Date End Date Taking? Authorizing Provider  aspirin 325 MG tablet Take 325 mg by mouth every 6 (six) hours as needed for mild pain.   Yes Historical Provider, MD  ranitidine (ZANTAC) 150 MG capsule Take 150 mg by mouth daily as needed for heartburn.   Yes Historical Provider, MD  metoprolol tartrate (LOPRESSOR) 25 MG tablet Take 12.5 mg by mouth daily as needed. Take it for heart rhythm    Historical Provider, MD   Scheduled Meds: Continuous Infusions: PRN Meds:.  Allergies:  Allergies  Allergen Reactions  . Tramadol Other (See Comments)    Pt states that this medication causes insomnia and for her to have "crazy thoughts".     Social History   Social History  . Marital Status: Single    Spouse Name: N/A  . Number of Children:  4  . Years of Education: N/A   Occupational History  . Caregiver    Social History Main Topics  . Smoking status: Former Smoker    Quit date: 06/15/2007  . Smokeless tobacco: Never Used  . Alcohol Use: Yes     Comment: Occasional use  . Drug Use: No     Comment: "quit months ago"  . Sexual Activity: Yes    Birth Control/ Protection: Surgical, Condom   Other Topics Concern  . Not on file   Social History Narrative   Lives at home with four children. (11/2012: Twins age 56, 39 year old and 39 year old. 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           Family History  Problem Relation Age of Onset  . Diabetes Father    Family Status    Relation Status Death Age  . Father Alive     Review of Systems:   Full 14-point review of systems otherwise negative except as noted above.  Physical Exam: Blood pressure 103/75, pulse 108, temperature 98.5 F (36.9 C), temperature source Oral, resp. rate 19, height  (1.702 m), weight 210 lb (95.255 kg), last menstrual period 09/12/2015, SpO2 100 %. General: Well developed, well nourished,female in no acute distress. Head: Normocephalic, atraumatic, sclera non-icteric, no xanthomas, nares are without discharge. Dentition: Good Neck: No carotid bruits. JVD not elevated. No thyromegally Lungs: Good expansion bilaterally. without wheezes or rhonchi. Mild upper mid left chest wall tenderness Heart: IRRegular rate and rhythm with S1 S2.  No S3 or S4.  No murmur, no rubs, or gallops appreciated. Abdomen: Soft, non-tender, non-distended with normoactive bowel sounds. No hepatomegaly. No rebound/guarding. No obvious abdominal masses. Msk:  Strength and tone appear normal for age. No joint deformities or effusions, no spine or costo-vertebral angle tenderness. Extremities: No clubbing or cyanosis. No edema.  Distal pedal pulses are 2+ in 4 extrem Neuro: Alert and oriented X 3. Moves all extremities spontaneously. No focal deficits noted. Psych:  Responds to questions appropriately with a normal affect. Skin: No rashes or lesions noted  Labs:   Lab Results  Component Value Date   WBC 9.0 09/15/2015   HGB 12.8 09/15/2015   HCT 38.8 09/15/2015   MCV 89.0 09/15/2015   PLT 368 09/15/2015    Recent Labs  09/15/15 0650  INR 1.02     Recent Labs Lab 09/15/15 0650  NA 137  K 4.1  CL 107  CO2 18*  BUN 13  CREATININE 0.71  CALCIUM 9.2  GLUCOSE 98   Lab Results  Component Value Date   TSH 0.976 11/25/2014   No results for input(s): CKTOTAL, CKMB, TROPONINI in the last 72 hours.  Recent Labs  09/15/15 0701  TROPIPOC 0.00    Radiology/Studies: Dg Chest 2 View 09/15/2015    CLINICAL DATA:  Chest pain  EXAM: CHEST  2 VIEW  COMPARISON:  11/25/2014  FINDINGS: The heart size and mediastinal contours are within normal limits. Both lungs are clear. The visualized skeletal structures are unremarkable.  IMPRESSION: No active cardiopulmonary disease.   Electronically Signed   By: Marlan Palau M.D.   On: 09/15/2015 07:24   Echo: 11/26/2014 Conclusions - Left ventricle: The cavity size was normal. Systolic function was normal. The estimated ejection fraction was in the range of 55% to 60%.  ECG: 09/15/2015 Atrial fibrillation, rapid ventricular response Rate 112  ASSESSMENT AND PLAN:  Principal Problem:  Atrial fibrillation with RVR (HCC) - Her rate is not well controlled and she is symptomatic. - Given Cardizem 10 mg IV 1. - She converted on this before. - It has been over a year between episodes. M.D. advise on doing a "pill in pocket" approach with either Cardizem or flecainide - Can use IV Cardizem or give one dose of by mouth flecainide and monitor in the emergency room. - This patients CHA2DS2-VASc Score and unadjusted Ischemic Stroke Rate (% per year) is equal to 0.6 % stroke rate/year from a score of 1 Above score calculated as 1 point each if present [CHF, HTN, DM, Vascular=MI/PAD/Aortic Plaque, Age if 65-74, or Female], 2 points each if present [Age > 75, or Stroke/TIA/TE] - Consider ASA  Active Problems:   Chest pain - no hx DM, HTN, HL or FH premature CAD - EF has been normal in the past. - would monitor symptoms after pt in SR, they may resolve - risk of occlusive CAD is very low    Anxiety reaction - Patient's primary care physician has prescribed Celexa in the past. The patient stated she did not take it because it is an intact at present and she did not feel depressed. - I discussed it with her and reminded her that she admitted to having anxiety even as a child. - She has a very busy life and does not feel she can let go of anything  right now. - I encouraged her to either give this medication a try or discuss it with her primary care physician.   Melida Quitter, PA-C 09/15/2015 10:08 AM Beeper [phone]  Patient seen, examined. Available data reviewed. Agree with findings, assessment, and plan as outlined by Theodore Demark, PA-C. On exam, the patient is a very pleasant age-appropriate 39 year old woman in no distress. Lung fields are clear. Heart is irregularly irregular without murmur or gallop. Abdomen is soft and nontender. There is no peripheral edema present. Telemetry shows atrial fibrillation with controlled ventricular rate. This is the patient's second documented episode of atrial fibrillation. She has no hx of structural heart problems or CAD. Echo 11/2014 is entirely normal. I think she is a good candidate for 'pill in the pocket' approach to atrial fib. Reviewed this strategy for her. Will give Flecainide 300 mg x1 now and if she converts to sinus rhythm will discharge her home. With CHADS-Vasc =1 for female gender, there is no indication for anticoagulation. Will arrange follow-up with Dr Antoine Poche or his PA/NP and will write Rx for flecainide to be used prn for atrial fib as pill-in-pocket approach if it works today.  Tonny Bollman, M.D. 09/15/2015 11:27 AM

## 2015-09-15 NOTE — Discharge Instructions (Signed)
Atrial Fibrillation °Atrial fibrillation is a type of heartbeat that is irregular or fast (rapid). If you have this condition, your heart keeps quivering in a weird (chaotic) way. This condition can make it so your heart cannot pump blood normally. Having this condition gives a person more risk for stroke, heart failure, and other heart problems. There are different types of atrial fibrillation. Talk with your doctor to learn about the type that you have. °HOME CARE °· Take over-the-counter and prescription medicines only as told by your doctor. °· If your doctor prescribed a blood-thinning medicine, take it exactly as told. Taking too much of it can cause bleeding. If you do not take enough of it, you will not have the protection that you need against stroke and other problems. °· Do not use any tobacco products. These include cigarettes, chewing tobacco, and e-cigarettes. If you need help quitting, ask your doctor. °· If you have apnea (obstructive sleep apnea), manage it as told by your doctor. °· Do not drink alcohol. °· Do not drink beverages that have caffeine. These include coffee, soda, and tea. °· Maintain a healthy weight. Do not use diet pills unless your doctor says they are safe for you. Diet pills may make heart problems worse. °· Follow diet instructions as told by your doctor. °· Exercise regularly as told by your doctor. °· Keep all follow-up visits as told by your doctor. This is important. °GET HELP IF: °· You notice a change in the speed, rhythm, or strength of your heartbeat. °· You are taking a blood-thinning medicine and you notice more bruising. °· You get tired more easily when you move or exercise. °GET HELP RIGHT AWAY IF: °· You have pain in your chest or your belly (abdomen). °· You have sweating or weakness. °· You feel sick to your stomach (nauseous). °· You notice blood in your throw up (vomit), poop (stool), or pee (urine). °· You are short of breath. °· You suddenly have swollen feet  and ankles. °· You feel dizzy. °· Your suddenly get weak or numb in your face, arms, or legs, especially if it happens on one side of your body. °· You have trouble talking, trouble understanding, or both. °· Your face or your eyelid droops on one side. °These symptoms may be an emergency. Do not wait to see if the symptoms will go away. Get medical help right away. Call your local emergency services (911 in the [place] Do not drive yourself to the hospital. °  °This information is not intended to replace advice given to you by your health care provider. Make sure you discuss any questions you have with your health care provider. °  °Document Released: 09/05/2008 Document Revised: 08/18/2015 Document Reviewed: 03/24/2015 °Elsevier Interactive Patient Education ©2016 [employer] ° °

## 2015-09-15 NOTE — ED Notes (Signed)
Pt. Ambulated independently in hallway to bathroom with no complaints of SOB/Dizziness.

## 2015-09-15 NOTE — ED Notes (Signed)
Paged Cardiology regarding pt. Conversion to NSR. Pt. Denies CP at this time. EKG recaptured with NSR.

## 2015-09-15 NOTE — ED Provider Notes (Signed)
CSN: [id number]     Arrival date & time 09/15/15  7846 History   First MD Initiated Contact with Patient 09/15/15 [phone]     Chief Complaint  Patient presents with  . Atrial Fibrillation     (Consider location/radiation/quality/duration/timing/severity/associated sxs/prior Treatment) HPI Comments: 39 year old female with past medical history including atrial fibrillation, GERD who presents with heart fluttering. Patient states that approximately 5 days ago, she had an episode of central and left shoulder pain that occurred at rest and was not associated with nausea, vomiting, or diaphoresis. The pain resolved spontaneously but she has had 1 or 2 more chest pain episodes since that time. She reports that yesterday she had an episode where she felt her heart "flopped" in her chest and when she checked her pulse she felt a positive for her heart rate got back to normal. She later began feeling fluttering in her chest and this morning woke up feeling her heart racing like previous episodes of atrial fibrillation. She felt mildly short of breath when she was walking and but denies any chest pain or shortness of breath currently. She has been off of metoprolol for the past several months. No cough/cold symptoms, fevers, or recent illness.  Patient is a 39 y.o. female presenting with atrial fibrillation. The history is provided by the patient.  Atrial Fibrillation    Past Medical History  Diagnosis Date  . GERD (gastroesophageal reflux disease)   . H pylori ulcer   . Breast lump     Mammogram (last 2013)  . Sinusitis   . Herpes simplex labialis   . Palpitations 06/14/2012  . Dysrhythmia 12/16/215      NEW ONSET ATRIAL FIBRILATION  . Anxiety    Past Surgical History  Procedure Laterality Date  . Cesarean section    . Tubal ligation      2008   Family History  Problem Relation Age of Onset  . Diabetes Father    Social History  Substance Use Topics  . Smoking status: Former Smoker     Quit date: 06/15/2007  . Smokeless tobacco: Never Used  . Alcohol Use: Yes     Comment: Occasional use   OB History    Gravida Para Term Preterm AB TAB SAB Ectopic Multiple Living   6 4 4  2 2   1 5       Obstetric Comments   Twins c-section in 2008     Review of Systems  10 Systems reviewed and are negative for acute change except as noted in the HPI.   Allergies  Tramadol  Home Medications   Prior to Admission medications   Medication Sig Start Date End Date Taking? Authorizing Provider  aspirin 325 MG tablet Take 325 mg by mouth every 6 (six) hours as needed for mild pain.   Yes Historical Provider, MD  ranitidine (ZANTAC) 150 MG capsule Take 150 mg by mouth daily as needed for heartburn.   Yes Historical Provider, MD  flecainide (TAMBOCOR) 150 MG tablet Take 2 tablets (300 mg total) by mouth 2 (two) times daily. 09/15/15   Rhonda G Barrett, PA-C  metoprolol tartrate (LOPRESSOR) 25 MG tablet Take 12.5 mg by mouth daily as needed. Take it for heart rhythm    Historical Provider, MD   BP 108/68 mmHg  Pulse 94  Temp(Src) 98.5 F (36.9 C) (Oral)  Resp 19  Ht 5\' 7"  (1.702 m)  Wt 210 lb (95.255 kg)  BMI 32.88 kg/m2  SpO2 100%  LMP 09/12/2015  Physical Exam  Constitutional: She is oriented to person, place, and time. She appears well-developed and well-nourished. No distress.  HENT:  Head: Normocephalic and atraumatic.  Moist mucous membranes  Eyes: Conjunctivae are normal. Pupils are equal, round, and reactive to light.  Neck: Neck supple.  Cardiovascular: Normal rate, normal heart sounds and intact distal pulses.   No murmur heard. Irregularly irregular rhythm  Pulmonary/Chest: Effort normal and breath sounds normal.  Abdominal: Soft. Bowel sounds are normal. She exhibits no distension. There is no tenderness.  Musculoskeletal: She exhibits no edema.  Neurological: She is alert and oriented to person, place, and time.  Fluent speech  Skin: Skin is warm and dry.   Psychiatric: She has a normal mood and affect. Judgment normal.  Nursing note and vitals reviewed.   ED Course  Procedures (including critical care time) Labs Review Labs Reviewed  BASIC METABOLIC PANEL - Abnormal; Notable for the following:    CO2 18 (*)    All other components within normal limits  CBC  PROTIME-INR  D-DIMER, QUANTITATIVE (NOT AT [facility])  Rosezena Sensor, ED  POC URINE PREG, ED    Imaging Review Dg Chest 2 View  09/15/2015   CLINICAL DATA:  Chest pain  EXAM: CHEST  2 VIEW  COMPARISON:  11/25/2014  FINDINGS: The heart size and mediastinal contours are within normal limits. Both lungs are clear. The visualized skeletal structures are unremarkable.  IMPRESSION: No active cardiopulmonary disease.   Electronically Signed   By: Marlan Palau M.D.   On: 09/15/2015 07:24   I have personally reviewed and evaluated these lab results as part of my medical decision-making.   EKG Interpretation   Date/Time:  Wednesday September 15 2015 06:41:26 EDT Ventricular Rate:  112 PR Interval:    QRS Duration: 69 QT Interval:  347 QTC Calculation: 474 R Axis:   63 Text Interpretation:  Atrial fibrillation Atrial fibrillation New since  previous tracing Confirmed by LITTLE MD, RACHEL [phone]) on 09/15/2015  7:08:02 AM     Medications  aspirin chewable tablet 324 mg (324 mg Oral Given 09/15/15 0920)  ondansetron (ZOFRAN) injection 4 mg (4 mg Intravenous Given 09/15/15 0921)  diltiazem (CARDIZEM) injection 10 mg (10 mg Intravenous Given 09/15/15 1000)  flecainide (TAMBOCOR) tablet 300 mg (300 mg Oral Given 09/15/15 1102)    MDM   Final diagnoses:  Paroxysmal atrial fibrillation (HCC)   39 year old female with a history of atrial fibrillation not currently on rate control who presents with heart fluttering sensation consistent with previous episodes of atrial fibrillation as well as a few episodes of chest pain that occurred several days ago. Patient well-appearing at presentation.  Vitals notable for heart rate 120, irregular. EKG showed atrial fibrillation without ischemic changes. No abnormalities noted on physical exam with the exception of the patient's rhythm. Obtained chest x-ray which was unremarkable. Sent above labwork including troponin and d-dimer.   Patient began complaining of chest pain and nausea while in the emergency department; gave Zofran and 321 mg aspirin. Basic lab work and troponin unremarkable.Consulted cardiology given unclear time of onset of symptoms. Pt evaluated by Aura Camps and Dr. Excell Seltzer; appreciate their assistance. Pt given a dose of diltiazem  followed by flecainide, after which she spontaneously converted to sinus rhythm. She remained sinus for several hours post-conversion and on re-evaluation, she was well appearing without complaints. Given Rx for flecainide and instructions on use at home. Cardiology plans to see her in the clinic. Return precautions reviewed and pt voiced  understanding. Discharged in satisfactory condition.  Laurence Spates, MD 09/15/15 2009

## 2015-09-15 NOTE — ED Notes (Signed)
Pt. Ambulatory to bathroom with no complaints.

## 2015-09-15 NOTE — ED Notes (Signed)
Pt to ED from home c/o afib,. Reports having one episode of a-fib in the past. Pt followup with Dr. Antoine Poche, was started on Metoprolol, but has not taken it in "a while." Pt reports chestpain 2 days ago that subsided. Yesterday while at work, pt felt a fluttering in chest that also subsided. After waking this morning pt felt like she was back in a-fib. HR 110-125

## 2015-09-30 ENCOUNTER — Other Ambulatory Visit: Payer: Self-pay | Admitting: Cardiology

## 2015-09-30 DIAGNOSIS — G478 Other sleep disorders: Secondary | ICD-10-CM

## 2015-09-30 DIAGNOSIS — R002 Palpitations: Secondary | ICD-10-CM

## 2015-09-30 DIAGNOSIS — E663 Overweight: Secondary | ICD-10-CM

## 2015-10-06 ENCOUNTER — Ambulatory Visit (INDEPENDENT_AMBULATORY_CARE_PROVIDER_SITE_OTHER): Payer: No Typology Code available for payment source | Admitting: Cardiology

## 2015-10-06 ENCOUNTER — Other Ambulatory Visit: Payer: Self-pay

## 2015-10-06 ENCOUNTER — Encounter: Payer: Self-pay | Admitting: Cardiology

## 2015-10-06 VITALS — BP 100/80 | HR 71 | Ht 67.0 in | Wt 217.9 lb

## 2015-10-06 DIAGNOSIS — I4891 Unspecified atrial fibrillation: Secondary | ICD-10-CM

## 2015-10-06 DIAGNOSIS — E663 Overweight: Secondary | ICD-10-CM

## 2015-10-06 DIAGNOSIS — I480 Paroxysmal atrial fibrillation: Secondary | ICD-10-CM

## 2015-10-06 DIAGNOSIS — G4730 Sleep apnea, unspecified: Secondary | ICD-10-CM | POA: Insufficient documentation

## 2015-10-06 MED ORDER — METOPROLOL TARTRATE 25 MG PO TABS: 12.5000 mg | ORAL_TABLET | Freq: Two times a day (BID) | ORAL | Status: DC

## 2015-10-06 NOTE — Assessment & Plan Note (Signed)
Recently seen in the ED with recurrent PAF. She is on Flecainide "pill in a pocket" prn

## 2015-10-06 NOTE — Patient Instructions (Addendum)
Your physician wants you to follow-up in: 1 year or sooner if needed with Dr. Antoine PocheHochrein. You will receive a reminder letter in the mail two months in advance. If you don't receive a letter, please call our office to schedule the follow-up appointment.   Your physician has recommended you make the following change in your medication: the lopressor has been changed to 12.5 mg twice a day. ( 25 mg- 1/2 tablet twice a day)  If you need a refill on your cardiac medications before your next appointment, please call your pharmacy.
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
## 2015-10-06 NOTE — Assessment & Plan Note (Signed)
For sleep study in January

## 2015-10-06 NOTE — Assessment & Plan Note (Signed)
BMI 34 

## 2015-10-28 ENCOUNTER — Emergency Department (HOSPITAL_COMMUNITY): Payer: Medicaid Other

## 2015-10-28 ENCOUNTER — Emergency Department (HOSPITAL_COMMUNITY)
Admission: EM | Admit: 2015-10-28 | Discharge: 2015-10-28 | Disposition: A | Discharge status: Home / Self Care | Payer: Medicaid Other | Attending: Emergency Medicine | Admitting: Emergency Medicine

## 2015-10-28 ENCOUNTER — Encounter (HOSPITAL_COMMUNITY): Payer: Self-pay | Admitting: Neurology

## 2015-10-28 DIAGNOSIS — Z8709 Personal history of other diseases of the respiratory system: Secondary | ICD-10-CM | POA: Diagnosis not present

## 2015-10-28 DIAGNOSIS — Z87891 Personal history of nicotine dependence: Secondary | ICD-10-CM | POA: Diagnosis not present

## 2015-10-28 DIAGNOSIS — Y998 Other external cause status: Secondary | ICD-10-CM | POA: Diagnosis not present

## 2015-10-28 DIAGNOSIS — Z8659 Personal history of other mental and behavioral disorders: Secondary | ICD-10-CM | POA: Diagnosis not present

## 2015-10-28 DIAGNOSIS — M25562 Pain in left knee: Secondary | ICD-10-CM

## 2015-10-28 DIAGNOSIS — Z8619 Personal history of other infectious and parasitic diseases: Secondary | ICD-10-CM | POA: Insufficient documentation

## 2015-10-28 DIAGNOSIS — Y92410 Unspecified street and highway as the place of occurrence of the external cause: Secondary | ICD-10-CM | POA: Insufficient documentation

## 2015-10-28 DIAGNOSIS — I499 Cardiac arrhythmia, unspecified: Secondary | ICD-10-CM | POA: Diagnosis not present

## 2015-10-28 DIAGNOSIS — Z79899 Other long term (current) drug therapy: Secondary | ICD-10-CM | POA: Insufficient documentation

## 2015-10-28 DIAGNOSIS — S8992XA Unspecified injury of left lower leg, initial encounter: Secondary | ICD-10-CM | POA: Insufficient documentation

## 2015-10-28 DIAGNOSIS — S3991XA Unspecified injury of abdomen, initial encounter: Secondary | ICD-10-CM | POA: Insufficient documentation

## 2015-10-28 DIAGNOSIS — Z3202 Encounter for pregnancy test, result negative: Secondary | ICD-10-CM | POA: Diagnosis not present

## 2015-10-28 DIAGNOSIS — Y9389 Activity, other specified: Secondary | ICD-10-CM | POA: Insufficient documentation

## 2015-10-28 DIAGNOSIS — S29012A Strain of muscle and tendon of back wall of thorax, initial encounter: Secondary | ICD-10-CM | POA: Diagnosis not present

## 2015-10-28 DIAGNOSIS — R1010 Upper abdominal pain, unspecified: Secondary | ICD-10-CM

## 2015-10-28 DIAGNOSIS — K219 Gastro-esophageal reflux disease without esophagitis: Secondary | ICD-10-CM | POA: Insufficient documentation

## 2015-10-28 LAB — URINALYSIS, ROUTINE W REFLEX MICROSCOPIC
BILIRUBIN URINE: NEGATIVE
GLUCOSE, UA: NEGATIVE mg/dL
KETONES UR: NEGATIVE mg/dL
Leukocytes, UA: NEGATIVE
Nitrite: NEGATIVE
PROTEIN: NEGATIVE mg/dL
Specific Gravity, Urine: 1.03 (ref 1.005–1.030)
pH: 5 (ref 5.0–8.0)

## 2015-10-28 LAB — COMPREHENSIVE METABOLIC PANEL
ALT: 16 U/L (ref 14–54)
AST: 17 U/L (ref 15–41)
Albumin: 3.8 g/dL (ref 3.5–5.0)
Alkaline Phosphatase: 43 U/L (ref 38–126)
Anion gap: 6 (ref 5–15)
BILIRUBIN TOTAL: 0.5 mg/dL (ref 0.3–1.2)
BUN: 10 mg/dL (ref 6–20)
CALCIUM: 9.1 mg/dL (ref 8.9–10.3)
CO2: 22 mmol/L (ref 22–32)
CREATININE: 0.67 mg/dL (ref 0.44–1.00)
Chloride: 110 mmol/L (ref 101–111)
Glucose, Bld: 92 mg/dL (ref 65–99)
Potassium: 4 mmol/L (ref 3.5–5.1)
Sodium: 138 mmol/L (ref 135–145)
TOTAL PROTEIN: 6.7 g/dL (ref 6.5–8.1)

## 2015-10-28 LAB — CBC
HEMATOCRIT: 37.5 % (ref 36.0–46.0)
Hemoglobin: 11.9 g/dL — ABNORMAL LOW (ref 12.0–15.0)
MCH: 28.3 pg (ref 26.0–34.0)
MCHC: 31.7 g/dL (ref 30.0–36.0)
MCV: 89.1 fL (ref 78.0–100.0)
Platelets: 387 10*3/uL (ref 150–400)
RBC: 4.21 MIL/uL (ref 3.87–5.11)
RDW: 13.8 % (ref 11.5–15.5)
WBC: 8.8 10*3/uL (ref 4.0–10.5)

## 2015-10-28 LAB — URINE MICROSCOPIC-ADD ON

## 2015-10-28 LAB — LIPASE, BLOOD: LIPASE: 24 U/L (ref 11–51)

## 2015-10-28 LAB — PREGNANCY, URINE: Preg Test, Ur: NEGATIVE

## 2015-10-28 LAB — HCG, QUANTITATIVE, PREGNANCY: hCG, Beta Chain, Quant, S: <1 m[IU]/mL (ref <5)

## 2015-10-28 MED ORDER — HYDROCODONE-ACETAMINOPHEN 5-325 MG PO TABS: 1.0000 | ORAL_TABLET | Freq: Four times a day (QID) | ORAL | Status: DC | PRN

## 2015-10-28 MED ORDER — CYCLOBENZAPRINE HCL 10 MG PO TABS: 10.0000 mg | ORAL_TABLET | Freq: Two times a day (BID) | ORAL | Status: DC | PRN

## 2015-10-28 MED ORDER — IOHEXOL 300 MG/ML  SOLN
100.0000 mL | Freq: Once | INTRAMUSCULAR | Status: AC | PRN
  Administered 2015-10-28: 100 mL via INTRAVENOUS

## 2015-10-28 MED ORDER — CYCLOBENZAPRINE HCL 10 MG PO TABS
5.0000 mg | ORAL_TABLET | Freq: Once | ORAL | Status: AC
  Administered 2015-10-28: 5 mg via ORAL
  Filled 2015-10-28: qty 1

## 2015-10-28 MED ORDER — HYDROCODONE-ACETAMINOPHEN 5-325 MG PO TABS
2.0000 | ORAL_TABLET | Freq: Once | ORAL | Status: AC
  Administered 2015-10-28: 2 via ORAL
  Filled 2015-10-28: qty 2

## 2015-10-28 NOTE — Discharge Instructions (Signed)
Abdominal Pain, Adult Many things can cause abdominal pain. Usually, abdominal pain is not caused by a disease and will improve without treatment. It can often be observed and treated at home. Your health care provider will do a physical exam and possibly order blood tests and X-rays to help determine the seriousness of your pain. However, in many cases, more time must pass before a clear cause of the pain can be found. Before that point, your health care provider may not know if you need more testing or further treatment. HOME CARE INSTRUCTIONS Monitor your abdominal pain for any changes. The following actions may help to alleviate any discomfort you are experiencing:  Only take over-the-counter or prescription medicines as directed by your health care provider.  Do not take laxatives unless directed to do so by your health care provider.  Try a clear liquid diet (broth, tea, or water) as directed by your health care provider. Slowly move to a bland diet as tolerated. SEEK MEDICAL CARE IF:  You have unexplained abdominal pain.  You have abdominal pain associated with nausea or diarrhea.  You have pain when you urinate or have a bowel movement.  You experience abdominal pain that wakes you in the night.  You have abdominal pain that is worsened or improved by eating food.  You have abdominal pain that is worsened with eating fatty foods.  You have a fever. SEEK IMMEDIATE MEDICAL CARE IF:  Your pain does not go away within 2 hours.  You keep throwing up (vomiting).  Your pain is felt only in portions of the abdomen, such as the right side or the left lower portion of the abdomen.  You pass bloody or black tarry stools. MAKE SURE YOU:  Understand these instructions.  Will watch your condition.  Will get help right away if you are not doing well or get worse.   This information is not intended to replace advice given to you by your health care provider. Make sure you discuss  any questions you have with your health care provider.   Document Released: 09/06/2005 Document Revised: 08/18/2015 Document Reviewed: 08/06/2013 Elsevier Interactive Patient Education 2016 Elsevier Inc.  Joint Pain Joint pain can be caused by many things. The joint can be bruised, infected, weak from aging, or sore from exercise. The pain will probably go away if you follow your doctor's instructions for home care. If your joint pain continues, more tests may be needed to help find the cause of your condition. HOME CARE Watch your condition for any changes. Follow these instructions as told to lessen the pain that you are feeling:  Take medicines only as told by your doctor.  Rest the sore joint for as long as told by your doctor. If your doctor tells you to, raise (elevate) the painful joint above the level of your heart while you are sitting or lying down.  Do not do things that cause pain or make the pain worse.  If told, put ice on the painful area:  Put ice in a plastic bag.  Place a towel between your skin and the bag.  Leave the ice on for 20 minutes, 2-3 times per day.  Wear an elastic bandage, splint, or sling as told by your doctor. Loosen the bandage or splint if your fingers or toes lose feeling (become numb) and tingle, or if they turn cold and blue.  Begin exercising or stretching the joint as told by your doctor. Ask your doctor what types of  exercise are safe for you.  Keep all follow-up visits as told by your doctor. This is important. GET HELP IF:  Your pain gets worse and medicine does not help it.  Your joint pain does not get better in 3 days.  You have more bruising or swelling.  You have a fever.  You lose 10 pounds (4.5 kg) or more without trying. GET HELP RIGHT AWAY IF:  You are not able to move the joint.  Your fingers or toes become numb or they turn cold and blue.   This information is not intended to replace advice given to you by your  health care provider. Make sure you discuss any questions you have with your health care provider.   Document Released: 11/15/2009 Document Revised: 12/18/2014 Document Reviewed: 09/08/2014 Elsevier Interactive Patient Education 2016 Elsevier Inc.  Musculoskeletal Pain Musculoskeletal pain is muscle and boney aches and pains. These pains can occur in any part of the body. Your caregiver may treat you without knowing the cause of the pain. They may treat you if blood or urine tests, X-rays, and other tests were normal.  CAUSES There is often not a definite cause or reason for these pains. These pains may be caused by a type of germ (virus). The discomfort may also come from overuse. Overuse includes working out too hard when your body is not fit. Boney aches also come from weather changes. Bone is sensitive to atmospheric pressure changes. HOME CARE INSTRUCTIONS   Ask when your test results will be ready. Make sure you get your test results.  Only take over-the-counter or prescription medicines for pain, discomfort, or fever as directed by your caregiver. If you were given medications for your condition, do not drive, operate machinery or power tools, or sign legal documents for 24 hours. Do not drink alcohol. Do not take sleeping pills or other medications that may interfere with treatment.  Continue all activities unless the activities cause more pain. When the pain lessens, slowly resume normal activities. Gradually increase the intensity and duration of the activities or exercise.  During periods of severe pain, bed rest may be helpful. Lay or sit in any position that is comfortable.  Putting ice on the injured area.  Put ice in a bag.  Place a towel between your skin and the bag.  Leave the ice on for 15 to 20 minutes, 3 to 4 times a day.  Follow up with your caregiver for continued problems and no reason can be found for the pain. If the pain becomes worse or does not go away, it may  be necessary to repeat tests or do additional testing. Your caregiver may need to look further for a possible cause. SEEK IMMEDIATE MEDICAL CARE IF:  You have pain that is getting worse and is not relieved by medications.  You develop chest pain that is associated with shortness or breath, sweating, feeling sick to your stomach (nauseous), or throw up (vomit).  Your pain becomes localized to the abdomen.  You develop any new symptoms that seem different or that concern you. MAKE SURE YOU:   Understand these instructions.  Will watch your condition.  Will get help right away if you are not doing well or get worse.   This information is not intended to replace advice given to you by your health care provider. Make sure you discuss any questions you have with your health care provider.   Document Released: 11/27/2005 Document Revised: 02/19/2012 Document Reviewed: 08/01/2013 Elsevier  Interactive Patient Education ©2016 Elsevier Inc. ° °
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
## 2015-10-28 NOTE — ED Notes (Signed)
Call no answer 
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
## 2015-10-28 NOTE — ED Notes (Signed)
Spoke with MD, no imaging ordered at this time. Will wait for pt to see provider.

## 2015-12-19 ENCOUNTER — Encounter (HOSPITAL_BASED_OUTPATIENT_CLINIC_OR_DEPARTMENT_OTHER): Payer: No Typology Code available for payment source

## 2015-12-22 ENCOUNTER — Other Ambulatory Visit: Payer: Self-pay | Admitting: Physician Assistant

## 2015-12-23 NOTE — Telephone Encounter (Signed)
Rx request sent to pharmacy.  

## 2015-12-24 ENCOUNTER — Ambulatory Visit (HOSPITAL_BASED_OUTPATIENT_CLINIC_OR_DEPARTMENT_OTHER): Payer: No Typology Code available for payment source

## 2016-01-23 ENCOUNTER — Encounter (HOSPITAL_BASED_OUTPATIENT_CLINIC_OR_DEPARTMENT_OTHER): Payer: Self-pay

## 2016-01-25 ENCOUNTER — Ambulatory Visit (HOSPITAL_BASED_OUTPATIENT_CLINIC_OR_DEPARTMENT_OTHER): Payer: Medicaid Other | Attending: Cardiology

## 2016-01-25 ENCOUNTER — Telehealth: Payer: Self-pay | Admitting: Cardiology

## 2016-01-25 DIAGNOSIS — E663 Overweight: Secondary | ICD-10-CM

## 2016-01-25 DIAGNOSIS — Z79899 Other long term (current) drug therapy: Secondary | ICD-10-CM | POA: Insufficient documentation

## 2016-01-25 DIAGNOSIS — G478 Other sleep disorders: Secondary | ICD-10-CM | POA: Diagnosis not present

## 2016-01-25 DIAGNOSIS — R0683 Snoring: Secondary | ICD-10-CM | POA: Insufficient documentation

## 2016-01-25 DIAGNOSIS — R002 Palpitations: Secondary | ICD-10-CM | POA: Diagnosis not present

## 2016-01-25 DIAGNOSIS — I480 Paroxysmal atrial fibrillation: Secondary | ICD-10-CM | POA: Insufficient documentation

## 2016-01-25 DIAGNOSIS — Z6833 Body mass index (BMI) 33.0-33.9, adult: Secondary | ICD-10-CM | POA: Insufficient documentation

## 2016-01-25 NOTE — Telephone Encounter (Signed)
She is having the sleep study tonight. She wants to know should she take her Beta Blocker?

## 2016-01-25 NOTE — Telephone Encounter (Signed)
Left message for pt, okay to take all medications.

## 2016-01-31 ENCOUNTER — Other Ambulatory Visit: Payer: Self-pay | Admitting: Nurse Practitioner

## 2016-01-31 DIAGNOSIS — N63 Unspecified lump in unspecified breast: Secondary | ICD-10-CM

## 2016-02-01 ENCOUNTER — Encounter: Payer: Self-pay | Admitting: Cardiology

## 2016-02-01 ENCOUNTER — Ambulatory Visit (INDEPENDENT_AMBULATORY_CARE_PROVIDER_SITE_OTHER): Payer: Medicaid Other | Admitting: Cardiology

## 2016-02-01 ENCOUNTER — Telehealth: Payer: Self-pay | Admitting: *Deleted

## 2016-02-01 VITALS — BP 112/82 | HR 76 | Ht 67.0 in | Wt 220.2 lb

## 2016-02-01 DIAGNOSIS — I4891 Unspecified atrial fibrillation: Secondary | ICD-10-CM

## 2016-02-01 MED ORDER — FLECAINIDE ACETATE 150 MG PO TABS: 300.0000 mg | ORAL_TABLET | Freq: Two times a day (BID) | ORAL | Status: DC

## 2016-02-01 MED ORDER — FLECAINIDE ACETATE 150 MG PO TABS: ORAL_TABLET | ORAL | Status: DC

## 2016-02-01 NOTE — Patient Instructions (Signed)
Your physician recommends that you schedule a follow-up appointment in: 6 MONTHS WITH DR. HOCHREIN   

## 2016-02-01 NOTE — Telephone Encounter (Signed)
-----   Message from Rollene Rotunda, MD sent at 02/01/2016  5:58 PM EST ----- Can you check to make sure that this was correct.  She is only taking PRN flecainide.  I don't know how the prescription was written yesterday.  I haven't signed that order yet.

## 2016-02-01 NOTE — Progress Notes (Signed)
HPI The patient presents for followup of atrial fibrillation. She has had two presentations with this.  Since being seen in October following one of these episodes she has had one further episode that she treated at home with flecainide.  She otherwise is feeling OK.   The patient denies any new symptoms such as chest discomfort, neck or arm discomfort. There has been no new shortness of breath, PND or orthopnea. There has been no presyncope or syncope.  She does ball room dancing.    She walks quite a bit at work.    Allergies  Allergen Reactions  . Tramadol Other (See Comments)    Pt states that this medication causes insomnia and for her to have "crazy thoughts".     Current Outpatient Prescriptions  Medication Sig Dispense Refill  . cyclobenzaprine (FLEXERIL) 10 MG tablet Take 1 tablet (10 mg total) by mouth 2 (two) times daily as needed for muscle spasms. Take as prescribed. Do NOT take greater or more frequently then prescribed. Do NOT take with other sedating medications or ANY alcohol as this can result in death. This medication can impair coordination and reflexes, and cause drowsiness. Do NOT perform tasks in which this would place you in danger as it can make you a FALL RISK. 10 tablet 0  . flecainide (TAMBOCOR) 150 MG tablet Take 2 tablets (300 mg total) by mouth 2 (two) times daily. (Patient not taking: Reported on 10/28/2015) 6 tablet 0  . HYDROcodone-acetaminophen (NORCO) 5-325 MG tablet Take 1 tablet by mouth every 6 (six) hours as needed for moderate pain. Take as prescribed. Do NOT take greater or more frequently then prescribed. Do NOT take with other sedating medications or ANY alcohol as this can result in death. This medication can impair coordination and reflexes, and cause drowsiness. Do NOT perform tasks in which this would place you in danger as it can make you a FALL RISK. 20 tablet 0  . metoprolol tartrate (LOPRESSOR) 25 MG tablet TAKE ONE TABLET BY MOUTH TWICE DAILY  60 tablet 9  . Multiple Vitamin (MULTIVITAMIN WITH MINERALS) TABS tablet Take 1 tablet by mouth daily.    . ranitidine (ZANTAC) 150 MG capsule Take 150 mg by mouth daily as needed for heartburn.    . [DISCONTINUED] ipratropium (ATROVENT) 0.06 % nasal spray Place 2 sprays into the nose 4 (four) times daily. 15 mL 12   No current facility-administered medications for this visit.    Past Medical History  Diagnosis Date  . GERD (gastroesophageal reflux disease)   . H pylori ulcer   . Breast lump     Mammogram (last 2013)  . Sinusitis   . Herpes simplex labialis   . Palpitations 06/14/2012  . Dysrhythmia 12/16/215      NEW ONSET ATRIAL FIBRILATION  . Anxiety     Past Surgical History  Procedure Laterality Date  . Cesarean section    . Tubal ligation      2008    ROS:  As stated in the HPI and negative for all other systems.  PHYSICAL EXAM There were no vitals taken for this visit. GENERAL:  Well appearing NECK:  No jugular venous distention, waveform within normal limits, carotid upstroke brisk and symmetric, no bruits, no thyromegaly LUNGS:  Clear to auscultation bilaterally CHEST:  Unremarkable HEART:  PMI not displaced or sustained,S1 and S2 within normal limits, no S3, no S4, no clicks, no rubs, no murmurs ABD:  Flat, positive bowel sounds normal in  frequency in pitch, no bruits, no rebound, no guarding, no midline pulsatile mass, no hepatomegaly, no splenomegaly EXT:  2 plus pulses throughout, no edema, no cyanosis no clubbing   EKG:  Sinus rhythm, rate 76, axis within normal limits, intervals within normal limits, no acute ST-T wave changes.  02/01/2016   ASSESSMENT AND PLAN  ATRIAL FIB:   She has had one further episode since her last visit and treated with "Pill in Pocket" flecainide.  She will continue with this therapy.    SNORING:  She had a sleep study but results are not yet available.  We will contact her with these results.

## 2016-02-07 NOTE — Sleep Study (Signed)
    Patient Name: Melanie Vincent, Melanie Vincent Study Date: 01/25/2016 Gender: Female D.O.B: [birth year] Age (years): 39 Referring Provider: Corine Shelter Height (inches): 67 Interpreting Physician: Nicki Guadalajara MD, ABSM Weight (lbs): 210 RPSGT: Wylie Hail BMI: 33 MRN: [id number] Neck Size: 14.50  CLINICAL INFORMATION Sleep Study Type: NPSG Indication for sleep study: PAF Epworth Sleepiness Score: 2  SLEEP STUDY TECHNIQUE As per the AASM Manual for the Scoring of Sleep and Associated Events v2.3 (April 2016) with a hypopnea requiring 4% desaturations. The channels recorded and monitored were frontal, central and occipital EEG, electrooculogram (EOG), submentalis EMG (chin), nasal and oral airflow, thoracic and abdominal wall motion, anterior tibialis EMG, snore microphone, electrocardiogram, and pulse oximetry.  MEDICATIONS  flecainide (TAMBOCOR) 150 MG tablet      ipratropium (ATROVENT) 0.06 % nasal spray 2 spray, 4 times daily     metoprolol tartrate (LOPRESSOR) 25 MG tablet      Multiple Vitamin (MULTIVITAMIN WITH MINERALS) TABS tablet 1 tablet, Daily     pantoprazole     Medications self-administered by patient during sleep study : No sleep medicine administered.  SLEEP ARCHITECTURE The study was initiated at 10:19:35 PM and ended at 4:27:50 AM. Sleep onset time was 4.7 minutes and the sleep efficiency was 96.8%. The total sleep time was 356.5 minutes. Wake after sleep onset (WASO) was 7 minutes. Stage REM latency was 78.0 minutes. The patient spent 6.31% of the night in stage N1 sleep, 68.86% in stage N2 sleep, 0.56% in stage N3 and 24.26% in REM. Alpha intrusion was absent. Supine sleep was 67.70%.  RESPIRATORY PARAMETERS The overall apnea/hypopnea index (AHI) was 2.7 per hour. There were 0 total apneas, including 0 obstructive, 0 central and 0 mixed apneas. There were 16 hypopneas and 3 RERAs. The AHI during Stage REM sleep was 5.5 per hour. AHI while supine was 3.2 per  hour. The mean oxygen saturation was 95.77%. The minimum SpO2 during sleep was 92.00%. Moderate snoring was noted during this study.  CARDIAC DATA The 2 lead EKG demonstrated sinus rhythm. The mean heart rate was 75.99 beats per minute. Other EKG findings include: None.  LEG MOVEMENT DATA The total PLMS were 0 with a resulting PLMS index of 0.00. Associated arousal with leg movement index was 0.0 .  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 - No oxygen desaturation during the study (Min O2 = 92.00%) - Moderate snoring volume. - No cardiac abnormalities were noted during this study. - Clinically significant periodic limb movements did not occur during sleep. No significant associated arousals.  DIAGNOSIS -Increased Upper Airway Resistance syndrome  RECOMMENDATIONS - At present there is no indication for CPAP therapy.  If symptoms progress a follow-up study may be necessary in the future. - Consider alternatives for the treatment of snoring.  - Avoid alcohol, sedatives and other CNS depressants that may worsen sleep apnea and disrupt normal sleep architecture. - Sleep hygiene should be reviewed to assess factors that may improve sleep quality. - Weight management (BMI 33) and regular exercise should be initiated.    Lennette Bihari, MD, [facility] Diplomate, American Board of Sleep Medicine  ELECTRONICALLY SIGNED ON:  02/07/2016, 6:52 PM [facility] PH: [phone]   FX: [phone] ACCREDITED BY THE AMERICAN ACADEMY OF SLEEP MEDICINE

## 2016-02-08 ENCOUNTER — Ambulatory Visit
Admission: RE | Admit: 2016-02-08 | Discharge: 2016-02-08 | Disposition: A | Discharge status: Home / Self Care | Payer: Medicaid Other | Source: Ambulatory Visit | Attending: Nurse Practitioner | Admitting: Nurse Practitioner

## 2016-02-08 ENCOUNTER — Other Ambulatory Visit: Payer: Self-pay | Admitting: Nurse Practitioner

## 2016-02-08 DIAGNOSIS — N63 Unspecified lump in unspecified breast: Secondary | ICD-10-CM

## 2016-02-08 NOTE — Progress Notes (Signed)
There was no evidence of sleep apnea.  Please call with results.

## 2016-02-09 ENCOUNTER — Encounter: Payer: Self-pay | Admitting: *Deleted

## 2016-02-09 NOTE — Progress Notes (Signed)
Note sent to patient

## 2016-02-28 ENCOUNTER — Telehealth: Payer: Self-pay | Admitting: Cardiology

## 2016-02-28 NOTE — Telephone Encounter (Signed)
Advice communicated to patient, who verbalized understanding of instructions.

## 2016-02-28 NOTE — Telephone Encounter (Signed)
Pt had episode of tachycardia this afternoon. She'd dozed off and when she woke, noted her heart was racing. 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 Noted rate of 130 but was not irregular.  She notes episodes of a fib in the past - Pt has pill-in-pocket flecainide for this purpose.  She did not take any flecainide today and noted episode resolved w/in a few mins. Currently HR ~100.  We discussed metoprolol tartrate - she is taking this at 25mg  daily. Advised BID use as prescribed - this may diminish problems - and if issues w HR or BP running too low we would reduce metoprolol dose, not frequency.  Pt voiced understanding of instructions. Will route to [employer] for further advice.

## 2016-02-28 NOTE — Telephone Encounter (Signed)
Agree with advice, metoprolol should be twice daily, does not sound like she needs flecainide.  If HR still fast tomorrow, come in for ECG.  If rapid rate returns and associates chest pain, near-syncope or dyspnea, then needs urgent evaluation.

## 2016-02-28 NOTE — Telephone Encounter (Signed)
New Message  Episode of Tachycardia. Took a nap. Woke up her heart was racing, She is at school. Teacher is nurse heart rate was 130 and BP was 118/76. Pt request a call back to discuss what she should do if her Bp goes up.

## 2016-02-29 ENCOUNTER — Ambulatory Visit: Payer: Medicaid Other

## 2016-03-03 ENCOUNTER — Ambulatory Visit: Payer: Medicaid Other

## 2016-08-13 ENCOUNTER — Emergency Department (HOSPITAL_COMMUNITY)
Admission: EM | Admit: 2016-08-13 | Discharge: 2016-08-13 | Disposition: A | Discharge status: Home / Self Care | Payer: Medicaid Other | Attending: Emergency Medicine | Admitting: Emergency Medicine

## 2016-08-13 ENCOUNTER — Encounter (HOSPITAL_COMMUNITY): Payer: Self-pay

## 2016-08-13 ENCOUNTER — Emergency Department (HOSPITAL_COMMUNITY): Payer: Medicaid Other

## 2016-08-13 DIAGNOSIS — R112 Nausea with vomiting, unspecified: Secondary | ICD-10-CM | POA: Diagnosis present

## 2016-08-13 DIAGNOSIS — R109 Unspecified abdominal pain: Secondary | ICD-10-CM | POA: Diagnosis not present

## 2016-08-13 DIAGNOSIS — Z79899 Other long term (current) drug therapy: Secondary | ICD-10-CM | POA: Diagnosis not present

## 2016-08-13 DIAGNOSIS — R197 Diarrhea, unspecified: Secondary | ICD-10-CM | POA: Insufficient documentation

## 2016-08-13 DIAGNOSIS — Z87891 Personal history of nicotine dependence: Secondary | ICD-10-CM | POA: Insufficient documentation

## 2016-08-13 LAB — CBC WITH DIFFERENTIAL/PLATELET
Basophils Absolute: 0 10*3/uL (ref 0.0–0.1)
Basophils Relative: 0 %
EOS PCT: 0 %
Eosinophils Absolute: 0 10*3/uL (ref 0.0–0.7)
HCT: 37.8 % (ref 36.0–46.0)
HEMOGLOBIN: 12 g/dL (ref 12.0–15.0)
LYMPHS ABS: 1.2 10*3/uL (ref 0.7–4.0)
LYMPHS PCT: 18 %
MCH: 28.6 pg (ref 26.0–34.0)
MCHC: 31.7 g/dL (ref 30.0–36.0)
MCV: 90 fL (ref 78.0–100.0)
Monocytes Absolute: 0.5 10*3/uL (ref 0.1–1.0)
Monocytes Relative: 7 %
NEUTROS PCT: 75 %
Neutro Abs: 5.2 10*3/uL (ref 1.7–7.7)
Platelets: 326 10*3/uL (ref 150–400)
RBC: 4.2 MIL/uL (ref 3.87–5.11)
RDW: 14 % (ref 11.5–15.5)
WBC: 7 10*3/uL (ref 4.0–10.5)

## 2016-08-13 LAB — COMPREHENSIVE METABOLIC PANEL
ALBUMIN: 3.7 g/dL (ref 3.5–5.0)
ALT: 16 U/L (ref 14–54)
AST: 20 U/L (ref 15–41)
Alkaline Phosphatase: 48 U/L (ref 38–126)
Anion gap: 4 — ABNORMAL LOW (ref 5–15)
BUN: 8 mg/dL (ref 6–20)
CHLORIDE: 110 mmol/L (ref 101–111)
CO2: 23 mmol/L (ref 22–32)
CREATININE: 0.7 mg/dL (ref 0.44–1.00)
Calcium: 9 mg/dL (ref 8.9–10.3)
GFR calc non Af Amer: >60 mL/min (ref >60)
GLUCOSE: 94 mg/dL (ref 65–99)
Potassium: 3.9 mmol/L (ref 3.5–5.1)
SODIUM: 137 mmol/L (ref 135–145)
Total Bilirubin: <0.3 mg/dL — ABNORMAL LOW (ref 0.3–1.2)
Total Protein: 6.8 g/dL (ref 6.5–8.1)

## 2016-08-13 LAB — URINALYSIS, ROUTINE W REFLEX MICROSCOPIC
Bilirubin Urine: NEGATIVE
Glucose, UA: NEGATIVE mg/dL
HGB URINE DIPSTICK: NEGATIVE
Ketones, ur: NEGATIVE mg/dL
LEUKOCYTES UA: NEGATIVE
NITRITE: NEGATIVE
PROTEIN: NEGATIVE mg/dL
Specific Gravity, Urine: 1.024 (ref 1.005–1.030)
pH: 6.5 (ref 5.0–8.0)

## 2016-08-13 LAB — WET PREP, GENITAL
CLUE CELLS WET PREP: NONE SEEN
SPERM: NONE SEEN
TRICH WET PREP: NONE SEEN
Yeast Wet Prep HPF POC: NONE SEEN

## 2016-08-13 LAB — POC URINE PREG, ED: Preg Test, Ur: NEGATIVE

## 2016-08-13 LAB — POC OCCULT BLOOD, ED: Fecal Occult Bld: NEGATIVE

## 2016-08-13 LAB — LIPASE, BLOOD: Lipase: 20 U/L (ref 11–51)

## 2016-08-13 MED ORDER — ONDANSETRON 4 MG PO TBDP: 4.0000 mg | ORAL_TABLET | Freq: Three times a day (TID) | ORAL | 0 refills | Status: DC | PRN

## 2016-08-13 MED ORDER — IOPAMIDOL (ISOVUE-300) INJECTION 61%
INTRAVENOUS | Status: AC
  Administered 2016-08-13: 100 mL
  Filled 2016-08-13: qty 100

## 2016-08-13 MED ORDER — DICYCLOMINE HCL 10 MG PO CAPS
20.0000 mg | ORAL_CAPSULE | Freq: Once | ORAL | Status: AC
  Administered 2016-08-13: 20 mg via ORAL
  Filled 2016-08-13: qty 2

## 2016-08-13 MED ORDER — SODIUM CHLORIDE 0.9 % IV BOLUS (SEPSIS)
1000.0000 mL | Freq: Once | INTRAVENOUS | Status: AC
  Administered 2016-08-13: 1000 mL via INTRAVENOUS

## 2016-08-13 MED ORDER — DICYCLOMINE HCL 20 MG PO TABS
20.0000 mg | ORAL_TABLET | Freq: Two times a day (BID) | ORAL | 0 refills | Status: DC
Start: 2016-08-13 — End: 2018-04-08

## 2016-08-13 MED ORDER — ONDANSETRON HCL 4 MG/2ML IJ SOLN
4.0000 mg | Freq: Once | INTRAMUSCULAR | Status: AC
  Administered 2016-08-13: 4 mg via INTRAVENOUS
  Filled 2016-08-13: qty 2

## 2016-08-13 MED ORDER — FENTANYL CITRATE (PF) 100 MCG/2ML IJ SOLN
50.0000 ug | Freq: Once | INTRAMUSCULAR | Status: AC
  Administered 2016-08-13: 50 ug via INTRAVENOUS
  Filled 2016-08-13: qty 2

## 2016-08-13 NOTE — ED Provider Notes (Signed)
[facility]-EMERGENCY DEPT Provider Note   CSN: [id number] Arrival date & time: 08/13/16  1003     History   Chief Complaint Chief Complaint  Patient presents with  . Abdominal Pain    HPI Melanie Vincent is a 40 y.o. female.  Melanie Vincent is a 40 y.o. female with history of GERD, H. Pylori, paroxysmal a.fib not on anticoagulation, overweight presents to ED with complaint of abdominal pain, N/V,D. patient states approximately a few weeks ago she noticed a change in her bowel habits. She states that she started having less frequent bowel movements. She is seen by GI doctor who recommended she increase her fiber intake. She states she has been trying to eat healthier and started a colon cleanse approximately one week ago. The colon cleanse of an oral supplement in which she has taken 2-3 tabs daily until this past Thursday. A few days ago she noticed mucus coating her stool. Yesterday she started experiencing severe intermittent lower abdominal cramping. Last night she noticed bright red blood coating the stool. Today she has had 6 episodes of bloody, mucousy diarrhea. No discernible pattern for when abdominal pain comes. Patient does endorse desiccation provided some relief of abdominal pain. She has associated nausea and vomiting. She has had 3 episodes of vomiting today. She also endorses chills and diaphoresis; however, no fever. She denies dysuria, hematuria, vaginal pain, vaginal bleeding, vaginal discharge. She is sexually active with 1 female partner with intermittent use of barrier protection. No one with similar symptoms. No recent travel outside the [place]      Past Medical History:  Diagnosis Date  . Anxiety   . Breast lump    Mammogram (last 2013)  . Dysrhythmia 12/16/215     NEW ONSET ATRIAL FIBRILATION  . GERD (gastroesophageal reflux disease)   . H pylori ulcer   . Herpes simplex labialis   . Palpitations 06/14/2012  . Sinusitis     Patient Active Problem List   Diagnosis  Date Noted  . Sleep apnea suspected 10/06/2015  . Anxiety reaction 09/15/2015  . PAF (paroxysmal atrial fibrillation) (HCC) 11/25/2014  . Back pain 05/28/2014  . Dysuria 05/28/2014  . Herpes labialis 07/29/2013  . Well woman exam (no gynecological exam) 05/02/2013  . GERD (gastroesophageal reflux disease) 11/13/2012  . Overweight 06/14/2012    Past Surgical History:  Procedure Laterality Date  . CESAREAN SECTION    . TUBAL LIGATION     2008    OB History    Gravida Para Term Preterm AB Living   6 4 4   2 5    SAB TAB Ectopic Multiple Live Births     2   1        Obstetric Comments   Twins c-section in 2008       Home Medications    Prior to Admission medications   Medication Sig Start Date End Date Taking? Authorizing Provider  flecainide (TAMBOCOR) 150 MG tablet Take 2 tablets by mouth as needed for palpitations. Patient taking differently: Take 300 mg by mouth as needed (for palpitations).  02/01/16  Yes Rollene RotundaJames Hochrein, MD  metoprolol tartrate (LOPRESSOR) 25 MG tablet TAKE ONE TABLET BY MOUTH TWICE DAILY Patient taking differently: Take 25 mg by mouth twice daily 12/23/15  Yes Rollene RotundaJames Hochrein, MD  pantoprazole (PROTONIX) 20 MG tablet Take 20 mg by mouth daily.   Yes Historical Provider, MD  dicyclomine (BENTYL) 20 MG tablet Take 1 tablet (20 mg total) by mouth 2 (two) times  daily. 08/13/16   Lona Kettle, PA-C  ondansetron (ZOFRAN ODT) 4 MG disintegrating tablet Take 1 tablet (4 mg total) by mouth every 8 (eight) hours as needed for nausea or vomiting. 08/13/16   Lona Kettle, PA-C    Family History Family History  Problem Relation Age of Onset  . Diabetes Father     Social History Social History  Substance Use Topics  . Smoking status: Former Smoker    Quit date: 06/15/2007  . Smokeless tobacco: Never Used  . Alcohol use Yes     Comment: Occasional use     Allergies   Tramadol   Review of Systems Review of Systems  Constitutional: Positive for  chills and diaphoresis. Negative for fever.  HENT: Positive for sinus pressure ( chronic). Negative for trouble swallowing.   Eyes: Negative for visual disturbance.  Respiratory: Negative for shortness of breath.   Cardiovascular: Negative for chest pain.  Gastrointestinal: Positive for abdominal pain, blood in stool, diarrhea, nausea and vomiting. Negative for constipation.  Genitourinary: Negative for dysuria, hematuria, pelvic pain, vaginal bleeding, vaginal discharge and vaginal pain.  Musculoskeletal: Negative for neck pain.  Skin: Negative for rash.  Neurological: Positive for headaches. Negative for dizziness, syncope, facial asymmetry, speech difficulty, weakness, light-headedness and numbness.     Physical Exam Updated Vital Signs BP 139/83   Pulse 85   Temp 98.5 F (36.9 C) (Oral)   Resp 18   SpO2 100%   Physical Exam  Constitutional: She appears well-developed and well-nourished. No distress.  HENT:  Head: Normocephalic and atraumatic.  Mouth/Throat: Oropharynx is clear and moist. No oropharyngeal exudate.  Eyes: Conjunctivae and EOM are normal. Pupils are equal, round, and reactive to light. Right eye exhibits no discharge. Left eye exhibits no discharge. No scleral icterus.  Neck: Normal range of motion and phonation normal. Neck supple. No neck rigidity. Normal range of motion present.  Cardiovascular: Normal rate, regular rhythm, normal heart sounds and intact distal pulses.   No murmur heard. Pulmonary/Chest: Effort normal and breath sounds normal. No stridor. No respiratory distress. She has no wheezes. She has no rales.  Abdominal: Soft. Bowel sounds are normal. She exhibits no distension. There is no tenderness. There is no rigidity, no rebound, no guarding and no CVA tenderness.  Genitourinary:  Genitourinary Comments: Chaperone present for duration of exam. External anatomy normal - no injury, lesions, masses, or rashes. No bleeding, lesions, masses, or  ulcerations in vaginal cavity. Cervix is closed with mild off-white discharge. No friability. No CMT, no adnexal tenderness, no masses palpated on bimanual exam.  No external hemorrhoids or fissures noted. Rectal tone is normal. No tenderness. No masses palpated. Minimal stool noted in rectal vault. Stool is light brown in color.   Musculoskeletal: Normal range of motion.  Lymphadenopathy:    She has no cervical adenopathy.  Neurological: She is alert. She is not disoriented. Coordination and gait normal. GCS eye subscore is 4. GCS verbal subscore is 5. GCS motor subscore is 6.  Mental Status:  Alert, thought content appropriate, able to give a coherent history. Speech fluent without evidence of aphasia. Able to follow 2 step commands without difficulty.  Cranial Nerves:  II:  Peripheral visual fields grossly normal, pupils equal, round, reactive to light III,IV, VI: ptosis not present, extra-ocular motions intact bilaterally  V,VII: smile symmetric, facial light touch sensation equal VIII: hearing grossly normal to voice  X: uvula elevates symmetrically  XI: bilateral shoulder shrug symmetric and strong XII: midline  tongue extension without fassiculations Motor:  Normal tone. 5/5 in upper and lower extremities bilaterally including strong and equal grip strength and dorsiflexion/plantar flexion Sensory: light touch normal in all extremities. Cerebellar: normal finger-to-nose with bilateral upper extremities Gait: normal gait and balance CV: distal pulses palpable throughout   Skin: Skin is warm and dry. She is not diaphoretic.  Psychiatric: She has a normal mood and affect. Her behavior is normal.     ED Treatments / Results  Labs (all labs ordered are listed, but only abnormal results are displayed) Labs Reviewed  WET PREP, GENITAL - Abnormal; Notable for the following:       Result Value   WBC, Wet Prep HPF POC FEW (*)    All other components within normal limits  COMPREHENSIVE  METABOLIC PANEL - Abnormal; Notable for the following:    Total Bilirubin <0.3 (*)    Anion gap 4 (*)    All other components within normal limits  URINALYSIS, ROUTINE W REFLEX MICROSCOPIC (NOT AT [facility])  LIPASE, BLOOD  CBC WITH DIFFERENTIAL/PLATELET  POC OCCULT BLOOD, ED  POC URINE PREG, ED  GC/CHLAMYDIA PROBE AMP ([place]) NOT AT [facility]    EKG  EKG Interpretation None       Radiology Ct Abdomen Pelvis W Contrast  Result Date: 08/13/2016 CLINICAL DATA:  Left lower quadrant pain with nausea and vomiting for several days. EXAM: CT ABDOMEN AND PELVIS WITH CONTRAST TECHNIQUE: Multidetector CT imaging of the abdomen and pelvis was performed using the standard protocol following bolus administration of intravenous contrast. CONTRAST:  ISOVUE-300 IOPAMIDOL (ISOVUE-300) INJECTION 61% COMPARISON:  10/28/2015 FINDINGS: Lower chest: Minimal bibasilar atelectasis. Heart size is normal. No imaged pericardial effusion or significant coronary artery calcifications. Hepatobiliary: No focal liver lesions are identified. Normal configuration of the liver. Gallbladder is present. Pancreas: Normal appearance. Spleen: Normal in appearance. Renal/Adrenal: Normal adrenal glands. Symmetric size and enhancement of both kidneys. No focal renal mass or hydronephrosis. Gastrointestinal tract: The stomach and small bowel loops are normal in appearance. The appendix is well seen and has a normal appearance. There is mild thickening of the wall of the colon beginning at the level of the splenic flexure extending inferiorly to the proximal sigmoid colon. Surrounding this portion of the colon there is very minimal inflammatory change in the mesentery. Reproductive/Pelvis: Uterus is present. No adnexal mass. No free pelvic fluid. Vascular/Lymphatic: No retroperitoneal or mesenteric adenopathy. No evidence for aortic aneurysm. Musculoskeletal/Abdominal wall: Unremarkable. Other: none IMPRESSION: 1. Mild inflammation and  wall thickening of the descending colon consistent with colitis. Infectious, inflammatory causes would be most likely. Ischemic colitis in this region would be unusual. 2. No evidence for bowel obstruction or abscess. 3. Normal appendix. Electronically Signed   By: Norva Pavlov M.D.   On: 08/13/2016 14:08    Procedures Procedures (including critical care time)  Medications Ordered in ED Medications  sodium chloride 0.9 % bolus 1,000 mL (1,000 mLs Intravenous New Bag/Given 08/13/16 1201)  ondansetron (ZOFRAN) injection 4 mg (4 mg Intravenous Given 08/13/16 1203)  fentaNYL (SUBLIMAZE) injection 50 mcg (50 mcg Intravenous Given 08/13/16 1210)  dicyclomine (BENTYL) capsule 20 mg (20 mg Oral Given 08/13/16 1213)  iopamidol (ISOVUE-300) 61 % injection (100 mLs  Contrast Given 08/13/16 1319)     Initial Impression / Assessment and Plan / ED Course  I have reviewed the triage vital signs and the nursing notes.  Pertinent labs & imaging results that were available during my care of the patient were  reviewed by me and considered in my medical decision making (see chart for details).  Clinical Course  Value Comment By Time   On re-evaluation patient endorses improvement in sxs. Abdomen is mildly tender on re-check, no guarding or rigidity.  Lona Kettle, [place] 09/03 1435  CT Abdomen Pelvis W Contrast Reviewed Lona Kettle, PA-C 09/03 1436    Patient presents to ED with complaint of intermittent abdominal pain. Patient is afebrile and non-toxic appearing in NAD. VSS. Mild TTP of lower abdomen. No guarding, rigidity, or peritoneal signs. Abdomen is soft. Positive BS. Normal rectal exam. Off white discharge on pelvic exam. Will check labs. Given h/o a.fib and not on anti-coagulation concern for mesenteric ischemia vs. Diverticulitis - will CT abd/pelvis. IVF, antiemetics, pain medication given.   Lipase normal - low suspicion for pancreatitis. Hemoccult negative.  CMP and CBC re-assuring. Low  suspicion for acute cholecystitis or cholangitis. U/A negative for UTI. Pregnancy test negative - doubt ectopic. Wet prep normal. CT abdomen/pelvis remarkable for colitis - ?inflammatory vs ?infectious vs ?Secondary to recent colon cleanse, low suspicion for mesenteric ischemia. Given lack of fever, blood, or leukocytosis do not feel ABX is warranted at this time.   On re-evaluation patient endorsed improvement in sxs. Abdomen remains mildly tender on re-check, she feels ready to go home. Patient passed PO challenge. Discussed results and plan with patient. Symptomatic management discussed. Rx bentyl and Zofran. Tylenol for pain relief. Stay hydrated. Follow up with PCP or GI next week for re-evaluation. Strict return precautions given. Patient voiced understanding and is agreeable.    Final Clinical Impressions(s) / ED Diagnoses   Final diagnoses:  Abdominal pain, unspecified abdominal location  Nausea vomiting and diarrhea    New Prescriptions New Prescriptions   DICYCLOMINE (BENTYL) 20 MG TABLET    Take 1 tablet (20 mg total) by mouth 2 (two) times daily.   ONDANSETRON (ZOFRAN ODT) 4 MG DISINTEGRATING TABLET    Take 1 tablet (4 mg total) by mouth every 8 (eight) hours as needed for nausea or vomiting.     Lona Kettle, PA-C 08/13/16 1535    Tilden Fossa, MD 08/14/16 [phone]

## 2016-08-13 NOTE — Discharge Instructions (Signed)
Read the information below.   Your labs are re-assuring. Your CT exam is suggestive of inflammation of your colon.  You are being prescribed medicine for nausea and for abdominal cramping.  You can also take tylenol for pain relief 650mg  every 6hrs.  Use the prescribed medication as directed.  Please discuss all new medications with your pharmacist.   Please call to follow up with your primary provider next week for a re-evaluation. You may return to the Emergency Department at any time for worsening condition or any new symptoms that concern you. Return to ED if you develop fever, bright red blood, inability to keep food/fluids down, constant/localized abdominal pain.

## 2016-08-13 NOTE — ED Notes (Signed)
Drinking ginger ale with no complications.

## 2016-08-13 NOTE — ED Triage Notes (Signed)
Patient complains of lower abdominal pain with cramping x 1 day. Nausea vomiting and bright red rectal bleeding with loose stools. Patient states the pain is worse this am

## 2016-08-15 LAB — GC/CHLAMYDIA PROBE AMP (~~LOC~~) NOT AT ARMC
Chlamydia: NEGATIVE
Neisseria Gonorrhea: NEGATIVE

## 2016-11-18 IMAGING — CR DG CHEST 2V
2 series · 2 of 2 positions shown · non-contrast
Comparison: 11/25/2014

CLINICAL DATA: Chest pain

EXAM:
CHEST  2 VIEW

[chest pa]
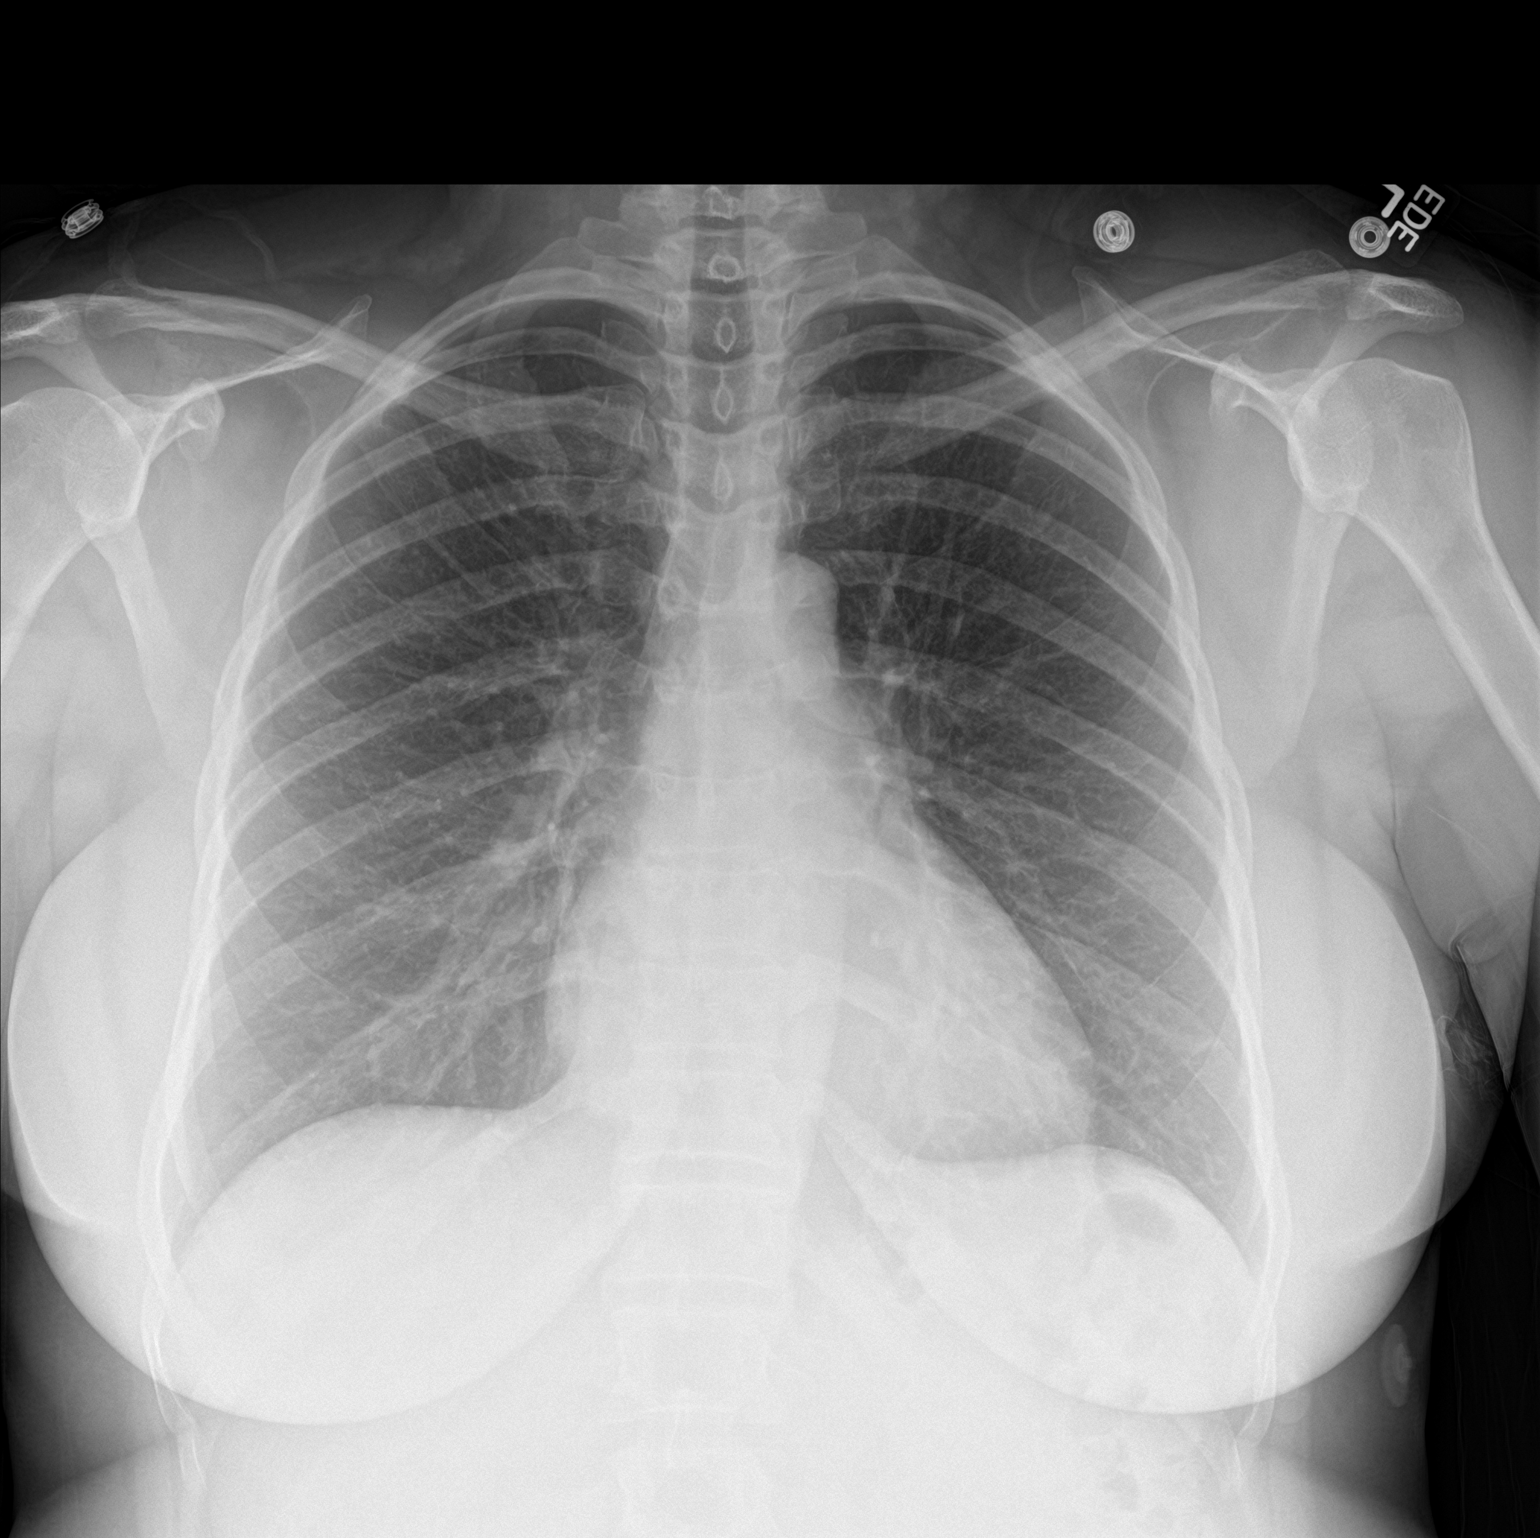

[chest lat]
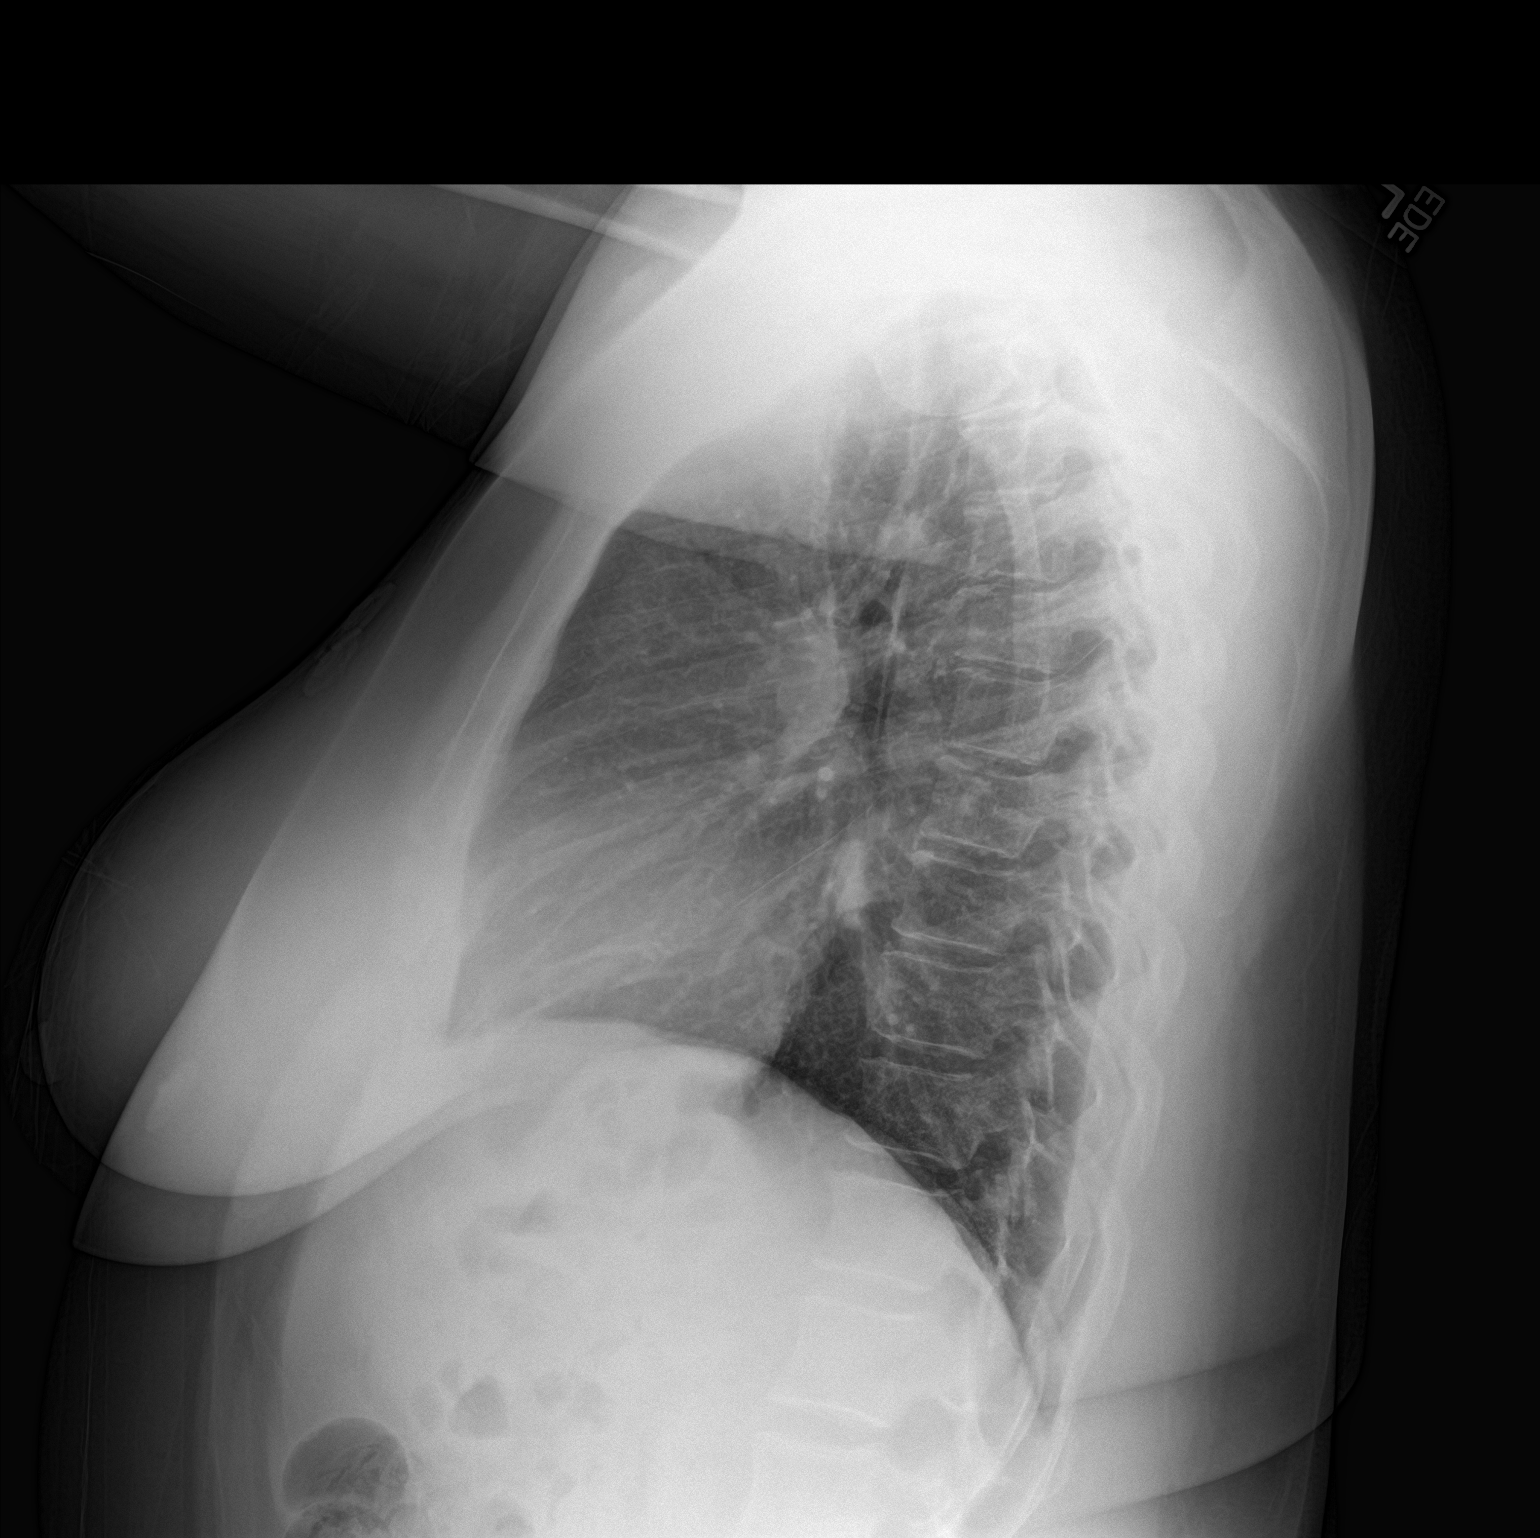

[2 of 2 positions shown; findings below may reference images not displayed]

FINDINGS: The heart size and mediastinal contours are within normal limits.
Both lungs are clear. The visualized skeletal structures are
unremarkable.
IMPRESSION: No active cardiopulmonary disease.

## 2017-01-25 ENCOUNTER — Encounter (HOSPITAL_COMMUNITY): Payer: Self-pay

## 2017-01-25 ENCOUNTER — Inpatient Hospital Stay (HOSPITAL_COMMUNITY)
Admission: AD | Admit: 2017-01-25 | Discharge: 2017-01-25 | Disposition: A | Discharge status: Home / Self Care | Payer: Medicaid Other | Source: Ambulatory Visit | Attending: Obstetrics & Gynecology | Admitting: Obstetrics & Gynecology

## 2017-01-25 DIAGNOSIS — N760 Acute vaginitis: Secondary | ICD-10-CM | POA: Diagnosis not present

## 2017-01-25 DIAGNOSIS — B349 Viral infection, unspecified: Secondary | ICD-10-CM | POA: Diagnosis not present

## 2017-01-25 DIAGNOSIS — R102 Pelvic and perineal pain: Secondary | ICD-10-CM | POA: Diagnosis present

## 2017-01-25 DIAGNOSIS — Z87891 Personal history of nicotine dependence: Secondary | ICD-10-CM | POA: Diagnosis not present

## 2017-01-25 DIAGNOSIS — B9689 Other specified bacterial agents as the cause of diseases classified elsewhere: Secondary | ICD-10-CM | POA: Diagnosis not present

## 2017-01-25 LAB — WET PREP, GENITAL
SPERM: NONE SEEN
Trich, Wet Prep: NONE SEEN
YEAST WET PREP: NONE SEEN

## 2017-01-25 LAB — URINALYSIS, ROUTINE W REFLEX MICROSCOPIC
BILIRUBIN URINE: NEGATIVE
GLUCOSE, UA: NEGATIVE mg/dL
HGB URINE DIPSTICK: NEGATIVE
Ketones, ur: 5 mg/dL — AB
Leukocytes, UA: NEGATIVE
Nitrite: NEGATIVE
PH: 5 (ref 5.0–8.0)
Protein, ur: NEGATIVE mg/dL
SPECIFIC GRAVITY, URINE: 1.031 — AB (ref 1.005–1.030)

## 2017-01-25 LAB — POCT PREGNANCY, URINE: PREG TEST UR: NEGATIVE

## 2017-01-25 MED ORDER — METRONIDAZOLE 500 MG PO TABS: 500.0000 mg | ORAL_TABLET | Freq: Two times a day (BID) | ORAL | 0 refills | Status: DC

## 2017-01-25 NOTE — MAU Provider Note (Signed)
History     CSN: [id number]  Arrival date and time: 01/25/17 1506   First Provider Initiated Contact with Patient 01/25/17 1558      Chief Complaint  Patient presents with  . Vaginitis  . Emesis  . Dizziness   Patient is a 41 year old G6 P5 who presents today with concerns for UTI. Patient reports she had sex approximately 2-3 weeks ago with a partner she has had sex before. It had been about a month since her last sexual encounter. She did not urinate immediately following intercourse and woke up several hours later with some burning pain. She reports she took cranberry and garlic initially the pain improved but now it is back. She additionally reports some chills and fever, subjective, as well as some intermittent back pain. She has no other complaints.    OB History    Gravida Para Term Preterm AB Living   6 4 4   2 5    SAB TAB Ectopic Multiple Live Births     2   1        Obstetric Comments   Twins c-section in 2008      Past Medical History:  Diagnosis Date  . Breast lump    Mammogram (last 2013)  . Dysrhythmia 12/16/215     NEW ONSET ATRIAL FIBRILATION  . GERD (gastroesophageal reflux disease)   . H pylori ulcer   . Herpes simplex labialis   . Palpitations 06/14/2012  . Sinusitis     Past Surgical History:  Procedure Laterality Date  . CESAREAN SECTION    . TUBAL LIGATION     2008    Family History  Problem Relation Age of Onset  . Diabetes Father     Social History  Substance Use Topics  . Smoking status: Former Smoker    Quit date: 06/15/2007  . Smokeless tobacco: Never Used  . Alcohol use Yes     Comment: Occasional use    Allergies:  Allergies  Allergen Reactions  . Tramadol Other (See Comments)    Pt states that this medication causes insomnia and for her to have "crazy thoughts".     Prescriptions Prior to Admission  Medication Sig Dispense Refill Last Dose  . dicyclomine (BENTYL) 20 MG tablet Take 1 tablet (20 mg total) by mouth 2  (two) times daily. 14 tablet 0   . flecainide (TAMBOCOR) 150 MG tablet Take 2 tablets by mouth as needed for palpitations. (Patient taking differently: Take 300 mg by mouth as needed (for palpitations). ) 6 tablet 2 Unknown at Unknown  . metoprolol tartrate (LOPRESSOR) 25 MG tablet TAKE ONE TABLET BY MOUTH TWICE DAILY (Patient taking differently: Take 25 mg by mouth twice daily) 60 tablet 9 Past Week at Unknown time  . ondansetron (ZOFRAN ODT) 4 MG disintegrating tablet Take 1 tablet (4 mg total) by mouth every 8 (eight) hours as needed for nausea or vomiting. 15 tablet 0   . pantoprazole (PROTONIX) 20 MG tablet Take 20 mg by mouth daily.   Past Week at Unknown time    Review of Systems  Constitutional: Positive for chills. Negative for fatigue and fever.  HENT: Negative for congestion and rhinorrhea.   Respiratory: Negative for cough and shortness of breath.   Cardiovascular: Negative for chest pain and palpitations.  Gastrointestinal: Positive for nausea. Negative for abdominal distention, abdominal pain, constipation, diarrhea and vomiting.  Genitourinary: Negative for difficulty urinating, dysuria and frequency.  Neurological: Negative for dizziness and weakness.   Physical  Exam   Blood pressure 118/80, pulse 101, temperature 98.7 F (37.1 C), temperature source Oral, resp. rate 18, height 5\' 6"  (1.676 m), weight 210 lb 0.6 oz (95.3 kg), last menstrual period 01/16/2017.  Physical Exam  Vitals reviewed. Constitutional: She is oriented to person, place, and time. She appears well-developed and well-nourished.  HENT:  Head: Normocephalic and atraumatic.  Cardiovascular: Normal rate, regular rhythm and normal heart sounds.  Exam reveals no gallop and no friction rub.   No murmur heard. Respiratory: Effort normal and breath sounds normal. No respiratory distress. She has no wheezes. She has no rales.  GI: Soft. Bowel sounds are normal. She exhibits no distension. There is no tenderness.  There is no rebound and no guarding.  Genitourinary:  Genitourinary Comments: No cervicitis noted, physiologic discharge noted healthy pink mucosa.  Musculoskeletal: Normal range of motion. She exhibits no edema.  No CVA tenderness  Neurological: She is alert and oriented to person, place, and time. No cranial nerve deficit.  Skin: Skin is warm and dry. No erythema.  Psychiatric: She has a normal mood and affect. Her behavior is normal.    MAU Course  Procedures  MDM In MA U patient underwent evaluation with the following -Urinalysis which was negative -Wet prep which revealed bacterial vaginosis -Gonorrhea and chlamydia which are pending  Patient's symptoms were consistent with UTI but with an entirely negative urinalysis do not believe patient to have urinary tract infection at this time. Some of her systemic symptoms may be secondary to viral syndrome and her burning may be secondary to the bacterial vaginosis.  Assessment and Plan  #1: Bacterial vaginosis: Patient with bacterial vaginosis Will treat with 500 mg of Flagyl twice a day for 7 days #2: Viral syndrome supportive care recommended.  Ernestina Pennaicholas Shelanda Duvall 01/25/2017, 4:28 PM

## 2017-01-25 NOTE — MAU Note (Signed)
Pt stated she had intercourse 1.5 weeks ago and had some vag burning/irritaion a few hours later. Had that for a day or so and took garlic and cranberry juice. Burning went away. Then stared having cold child/ flush and faint feeling off and on since.Had some vomiting yesterday and feels ike she might pass out at times.

## 2017-01-25 NOTE — Discharge Instructions (Signed)

## 2017-01-26 LAB — GC/CHLAMYDIA PROBE AMP (~~LOC~~) NOT AT ARMC
CHLAMYDIA, DNA PROBE: NEGATIVE
NEISSERIA GONORRHEA: NEGATIVE

## 2017-02-14 ENCOUNTER — Other Ambulatory Visit: Payer: Self-pay | Admitting: Cardiology

## 2017-02-15 NOTE — Telephone Encounter (Signed)
Rx(s) sent to pharmacy electronically.  

## 2018-02-18 ENCOUNTER — Other Ambulatory Visit: Payer: Self-pay | Admitting: Cardiology

## 2018-04-01 ENCOUNTER — Emergency Department (HOSPITAL_COMMUNITY)
Admission: EM | Admit: 2018-04-01 | Discharge: 2018-04-01 | Disposition: A | Discharge status: Home / Self Care | Payer: Medicaid Other | Attending: Emergency Medicine | Admitting: Emergency Medicine

## 2018-04-01 ENCOUNTER — Encounter (HOSPITAL_COMMUNITY): Payer: Self-pay | Admitting: Emergency Medicine

## 2018-04-01 ENCOUNTER — Emergency Department (HOSPITAL_COMMUNITY): Payer: Medicaid Other

## 2018-04-01 DIAGNOSIS — R1110 Vomiting, unspecified: Secondary | ICD-10-CM | POA: Diagnosis present

## 2018-04-01 DIAGNOSIS — I480 Paroxysmal atrial fibrillation: Secondary | ICD-10-CM | POA: Insufficient documentation

## 2018-04-01 DIAGNOSIS — I4891 Unspecified atrial fibrillation: Secondary | ICD-10-CM

## 2018-04-01 DIAGNOSIS — Z79899 Other long term (current) drug therapy: Secondary | ICD-10-CM | POA: Insufficient documentation

## 2018-04-01 DIAGNOSIS — Z87891 Personal history of nicotine dependence: Secondary | ICD-10-CM | POA: Insufficient documentation

## 2018-04-01 LAB — I-STAT BETA HCG BLOOD, ED (MC, WL, AP ONLY)
HCG, QUANTITATIVE: 5.1 m[IU]/mL — AB (ref <5)
I-stat hCG, quantitative: <5 m[IU]/mL (ref <5)

## 2018-04-01 LAB — CBC
HEMATOCRIT: 40.9 % (ref 36.0–46.0)
HEMOGLOBIN: 13.4 g/dL (ref 12.0–15.0)
MCH: 29.4 pg (ref 26.0–34.0)
MCHC: 32.8 g/dL (ref 30.0–36.0)
MCV: 89.7 fL (ref 78.0–100.0)
PLATELETS: 480 10*3/uL — AB (ref 150–400)
RBC: 4.56 MIL/uL (ref 3.87–5.11)
RDW: 14.1 % (ref 11.5–15.5)
WBC: 9.3 10*3/uL (ref 4.0–10.5)

## 2018-04-01 LAB — MAGNESIUM: MAGNESIUM: 2.1 mg/dL (ref 1.7–2.4)

## 2018-04-01 LAB — BASIC METABOLIC PANEL
ANION GAP: 10 (ref 5–15)
BUN: 9 mg/dL (ref 6–20)
CHLORIDE: 107 mmol/L (ref 101–111)
CO2: 22 mmol/L (ref 22–32)
CREATININE: 0.69 mg/dL (ref 0.44–1.00)
Calcium: 9.3 mg/dL (ref 8.9–10.3)
GFR calc non Af Amer: >60 mL/min (ref >60)
Glucose, Bld: 91 mg/dL (ref 65–99)
POTASSIUM: 4.3 mmol/L (ref 3.5–5.1)
SODIUM: 139 mmol/L (ref 135–145)

## 2018-04-01 MED ORDER — FLECAINIDE ACETATE 150 MG PO TABS: ORAL_TABLET | ORAL | 2 refills | Status: DC

## 2018-04-01 MED ORDER — FLECAINIDE ACETATE 100 MG PO TABS
300.0000 mg | ORAL_TABLET | Freq: Once | ORAL | Status: AC
  Administered 2018-04-01: 300 mg via ORAL
  Filled 2018-04-01 (×2): qty 3

## 2018-04-01 NOTE — ED Notes (Signed)
Pt over at xray at this time; xray to bring pt to room after.

## 2018-04-01 NOTE — ED Triage Notes (Signed)
Pt to ER for evaluation of palpitations and rapid heartbeat, reports hx of paroxymal atrial fibrillation 3 years ago. Reports ETOH use last night with emesis this AM. Pt in a fib RVR at this time. VSS. NAD. Reports cardiologist is MD Hochrein. States took a metoprolol tablet at 9 am this morning.

## 2018-04-01 NOTE — Discharge Instructions (Addendum)
Please call a fib clinic for recheck this week

## 2018-04-01 NOTE — ED Notes (Signed)
Pt. Manual bp was 110/86

## 2018-04-01 NOTE — ED Provider Notes (Addendum)
[facility]   CSN: [id number] Arrival date & time: 04/01/18  1107     History   Chief Complaint Chief Complaint  Patient presents with  . Atrial Fibrillation    HPI Melanie Vincent is a 42 y.o. female.  HPI 42 year old female history of A. fib 3 times in the past presents today stating that she feels that she is in atrial fibrillation.  She had some alcohol yesterday and has had strep for the past week.  She states that she woke up this morning vomiting around 4 AM.  She vomited 3-4 times.  She states that she then knew that she was in atrial fibrillation due to palpitations.  She has not been lightheaded or lost consciousness.  She took 25 mg of extended release Lopressor which she had.  She has had flecainide in the past but states that the pills were old and were crumbling, so she did not take them. Past Medical History:  Diagnosis Date  . Breast lump    Mammogram (last 2013)  . Dysrhythmia 12/16/215     NEW ONSET ATRIAL FIBRILATION  . GERD (gastroesophageal reflux disease)   . H pylori ulcer   . Herpes simplex labialis   . Palpitations 06/14/2012  . Sinusitis     Patient Active Problem List   Diagnosis Date Noted  . Sleep apnea suspected 10/06/2015  . Anxiety reaction 09/15/2015  . PAF (paroxysmal atrial fibrillation) (HCC) 11/25/2014  . Back pain 05/28/2014  . Dysuria 05/28/2014  . Herpes labialis 07/29/2013  . Well woman exam (no gynecological exam) 05/02/2013  . GERD (gastroesophageal reflux disease) 11/13/2012  . Overweight 06/14/2012    Past Surgical History:  Procedure Laterality Date  . CESAREAN SECTION    . TUBAL LIGATION     2008     OB History    Gravida  6   Para  4   Term  4   Preterm      AB  2   Living  5     SAB      TAB  2   Ectopic      Multiple  1   Live Births           Obstetric Comments  Twins c-section in 2008         Home Medications    Prior to  Admission medications   Medication Sig Start Date End Date Taking? Authorizing Provider  acetaminophen (TYLENOL) 325 MG tablet Take 650 mg by mouth every 6 (six) hours as needed for moderate pain.    [provider]  dicyclomine (BENTYL) 20 MG tablet Take 1 tablet (20 mg total) by mouth 2 (two) times daily. Patient not taking: Reported on 01/25/2017 08/13/16   Deborha PaymentMeyer, Ashley L, PA-C  flecainide (TAMBOCOR) 150 MG tablet Take 2 tablets by mouth as needed for palpitations. 02/01/16   Rollene RotundaHochrein, James, MD  ibuprofen (ADVIL,MOTRIN) 200 MG tablet Take 400 mg by mouth every 6 (six) hours as needed for moderate pain.    [provider]  metoprolol tartrate (LOPRESSOR) 25 MG tablet TAKE 1 TABLET BY MOUTH TWICE DAILY PLEASE  CONTACT  OFFICE  FOR  ADDITIONAL  REFILLS 02/18/18   Rollene RotundaHochrein, James, MD  metroNIDAZOLE (FLAGYL) 500 MG tablet Take 1 tablet (500 mg total) by mouth 2 (two) times daily. 01/25/17   Lorne SkeensSchenk, Nicholas Michael, MD  ondansetron (ZOFRAN ODT) 4 MG disintegrating tablet Take 1 tablet (4 mg total) by  mouth every 8 (eight) hours as needed for nausea or vomiting. Patient not taking: Reported on 01/25/2017 08/13/16   Arvilla Meres L, PA-C  pantoprazole (PROTONIX) 20 MG tablet Take 20 mg by mouth daily.    [provider]  ipratropium (ATROVENT) 0.06 % nasal spray Place 2 sprays into the nose 4 (four) times daily. 12/03/11 03/18/12  Linna Hoff, MD    Family History Family History  Problem Relation Age of Onset  . Diabetes Father     Social History Social History   Tobacco Use  . Smoking status: Former Smoker    Last attempt to quit: 06/15/2007    Years since quitting: 10.8  . Smokeless tobacco: Never Used  Substance Use Topics  . Alcohol use: Yes    Comment: Occasional use  . Drug use: No    Types: Marijuana    Comment: "quit months ago"     Allergies   Tramadol   Review of Systems Review of Systems  All other systems reviewed and are  negative.    Physical Exam Updated Vital Signs BP (!) 142/93   Pulse (!) 103   Temp 98.3 F (36.8 C) (Oral)   Resp (!) 22   SpO2 100%   Physical Exam  Constitutional: She is oriented to person, place, and time. She appears well-developed and well-nourished. No distress.  HENT:  Head: Normocephalic and atraumatic.  Right Ear: External ear normal.  Left Ear: External ear normal.  Mouth/Throat: Oropharynx is clear and moist.  Eyes: Pupils are equal, round, and reactive to light. EOM are normal.  Neck: Normal range of motion. Neck supple.  Cardiovascular: An irregularly irregular rhythm present.  Pulmonary/Chest: Effort normal and breath sounds normal.  Abdominal: Soft. Bowel sounds are normal.  Musculoskeletal: Normal range of motion.  Neurological: She is alert and oriented to person, place, and time.  Skin: Skin is warm. Capillary refill takes less than 2 seconds.  Psychiatric: She has a normal mood and affect. Her behavior is normal. Judgment and thought content normal.  Nursing note and vitals reviewed.    ED Treatments / Results  Labs (all labs ordered are listed, but only abnormal results are displayed) Labs Reviewed  CBC - Abnormal; Notable for the following components:      Result Value   Platelets 480 (*)    All other components within normal limits  BASIC METABOLIC PANEL  MAGNESIUM  I-STAT BETA HCG BLOOD, ED ([facility], [facility], [facility] ONLY)  I-STAT BETA HCG BLOOD, ED ([facility], [facility], [facility] ONLY)    EKG EKG Interpretation  Date/Time:  Monday April 01 2018 12:46:54 EDT Ventricular Rate:  107 PR Interval:    QRS Duration: 75 QT Interval:  337 QTC Calculation: 450 R Axis:   85 Text Interpretation:  Atrial fibrillation Probable anteroseptal infarct, old Confirmed by Margarita Grizzle [phone]) on 04/01/2018 2:52:22 PM    Radiology Dg Chest 2 View  Result Date: 04/01/2018 CLINICAL DATA:  Shortness of breath for a few days. EXAM: CHEST - 2 VIEW COMPARISON:  CT chest and PA and lateral  chest 10/28/2015. FINDINGS: Lungs clear. Heart size normal. No pneumothorax or pleural fluid. No bony abnormality. IMPRESSION: Negative chest. Electronically Signed   By: Drusilla Kanner M.D.   On: 04/01/2018 12:14    Procedures Procedures (including critical care time)  Medications Ordered in ED Medications  flecainide (TAMBOCOR) tablet 300 mg (has no administration in time range)     Initial Impression / Assessment and Plan / ED Course  I have reviewed the triage vital signs and the nursing notes.  Pertinent labs & imaging results that were available during my care of the patient were reviewed by me and considered in my medical decision making (see chart for details).    CHA2DS2/VAS Stroke Risk Points  Current as of 3 minutes ago     1 >= 2 Points: High Risk  1 - 1.99 Points: Medium Risk  0 Points: Low Risk    This is the only CHA2DS2/VAS Stroke Risk Points available for the past  year.:  Last Change: N/A     Details    This score determines the patient's risk of having a stroke if the  patient has atrial fibrillation.       Points Metrics  0 Has Congestive Heart Failure:  No    Current as of 3 minutes ago  0 Has Vascular Disease:  No    Current as of 3 minutes ago  0 Has Hypertension:  No    Current as of 3 minutes ago  0 Age:  61    Current as of 3 minutes ago  0 Has Diabetes:  No    Current as of 3 minutes ago  0 Had Stroke:  No  Had TIA:  No  Had thromboembolism:  No    Current as of 3 minutes ago  1 Female:  Yes    Current as of 3 minutes ago       Chadvasc 1 due to female Patient has previously been told she does not need anticoagulation    Patient now in nsr.  No other complaints.  Will consult cardiology re anticoagulation. Discussed with Dr. Tenny Craw.  She does not feel anticoagulation indicated given the discrete nature of the patient's symptoms and less than 12 hours.  Had some episodes of hypertension here, and Dr. Tenny Craw and I discussed this.  However,  patient has been normotensive with last systolic blood pressure of 110.  Also, patient normally would have flecainide at home and is not anticoagulated they are.  I am refilling the patient's flecainide.  I discussed return precautions and need for follow-up and patient voices understanding  Final Clinical Impressions(s) / ED Diagnoses   Final diagnoses:  Atrial fibrillation with RVR [facility])    ED Discharge Orders        Ordered    Amb referral to AFIB Clinic     04/01/18 1243       Margarita Grizzle, MD 04/01/18 1505    Margarita Grizzle, MD 04/01/18 1649

## 2018-04-01 NOTE — ED Notes (Signed)
ED Provider at bedside. 

## 2018-04-02 ENCOUNTER — Telehealth: Payer: Self-pay | Admitting: Cardiology

## 2018-04-02 NOTE — Telephone Encounter (Signed)
Call returned to Pt.  VM left per DPR. Requested call back to determine what Pt question is. Notified Pt she will probably need an appt with Dr. Antoine PocheHochrein (primary cardiologist) before we determine if Pt should be referred to afib clinic. Requested Pt return call to this nurse.

## 2018-04-02 NOTE — Telephone Encounter (Signed)
New message    Patient states she is in afib. Went to ED on yesterday.  Patient also has questions about being referred to afib clinic. Please call

## 2018-04-03 NOTE — Telephone Encounter (Signed)
Patient has an appointment with the atrial fib clinic.

## 2018-04-08 ENCOUNTER — Ambulatory Visit (HOSPITAL_COMMUNITY)
Admission: RE | Admit: 2018-04-08 | Discharge: 2018-04-08 | Disposition: A | Discharge status: Home / Self Care | Payer: Medicaid Other | Source: Ambulatory Visit | Attending: Nurse Practitioner | Admitting: Nurse Practitioner

## 2018-04-08 ENCOUNTER — Encounter (HOSPITAL_COMMUNITY): Payer: Self-pay | Admitting: Nurse Practitioner

## 2018-04-08 VITALS — BP 110/76 | HR 77 | Ht 66.0 in | Wt 208.0 lb

## 2018-04-08 DIAGNOSIS — Z888 Allergy status to other drugs, medicaments and biological substances status: Secondary | ICD-10-CM | POA: Diagnosis not present

## 2018-04-08 DIAGNOSIS — Z79899 Other long term (current) drug therapy: Secondary | ICD-10-CM | POA: Diagnosis not present

## 2018-04-08 DIAGNOSIS — Z87891 Personal history of nicotine dependence: Secondary | ICD-10-CM | POA: Insufficient documentation

## 2018-04-08 DIAGNOSIS — I480 Paroxysmal atrial fibrillation: Secondary | ICD-10-CM | POA: Insufficient documentation

## 2018-04-08 DIAGNOSIS — Z9851 Tubal ligation status: Secondary | ICD-10-CM | POA: Diagnosis not present

## 2018-04-08 DIAGNOSIS — Z833 Family history of diabetes mellitus: Secondary | ICD-10-CM | POA: Diagnosis not present

## 2018-04-08 DIAGNOSIS — Z9889 Other specified postprocedural states: Secondary | ICD-10-CM | POA: Insufficient documentation

## 2018-04-08 DIAGNOSIS — K219 Gastro-esophageal reflux disease without esophagitis: Secondary | ICD-10-CM | POA: Insufficient documentation

## 2018-04-08 NOTE — Progress Notes (Signed)
Primary Care Physician: [facility] Referring Physician: [facility] ER f/u Cardiologist: Dr. Wende Bushy is a 42 y.o. female with a h/o paroxysmal afib, has been quiet for a while but recently came back in the presence of strept throat, N/V and had some alcohol the evening before.She was given flecainide pill in pocket 300 mg and converted. She has this as well for at home use, but the RX was old and powdery and she was afraid to use in the setting of her other symptoms. She is not on anticoagulation for a chadsvasc score of1 (female).  Today, she denies symptoms of palpitations, chest pain, shortness of breath, orthopnea, PND, lower extremity edema, dizziness, presyncope, syncope, or neurologic sequela. The patient is tolerating medications without difficulties and is otherwise without complaint today.   Past Medical History:  Diagnosis Date  . Breast lump    Mammogram (last 2013)  . Dysrhythmia 12/16/215     NEW ONSET ATRIAL FIBRILATION  . GERD (gastroesophageal reflux disease)   . H pylori ulcer   . Herpes simplex labialis   . Palpitations 06/14/2012  . Sinusitis    Past Surgical History:  Procedure Laterality Date  . CESAREAN SECTION    . TUBAL LIGATION     2008    Current Outpatient Medications  Medication Sig Dispense Refill  . amoxicillin (AMOXIL) 500 MG capsule Take 500 mg by mouth 4 (four) times daily.    . flecainide (TAMBOCOR) 150 MG tablet Take 2 tablets by mouth as needed for palpitations. 6 tablet 2  . valACYclovir (VALTREX) 1000 MG tablet Take 2,000 mg by mouth See admin instructions. Take  at onset of symptoms and repeat  in 12 hours as needed     No current facility-administered medications for this encounter.     Allergies  Allergen Reactions  . Tramadol Other (See Comments)    Pt states that this medication causes insomnia and for her to have "crazy thoughts".     Social History   Socioeconomic History  . Marital  status: Single    Spouse name: Not on file  . Number of children: 4  . Years of education: Not on file  . Highest education level: Not on file  Occupational History  . Occupation: Caregiver  Social Needs  . Financial resource strain: Not on file  . Food insecurity:    Worry: Not on file    Inability: Not on file  . Transportation needs:    Medical: Not on file    Non-medical: Not on file  Tobacco Use  . Smoking status: Former Smoker    Last attempt to quit: 06/15/2007    Years since quitting: 10.8  . Smokeless tobacco: Never Used  Substance and Sexual Activity  . Alcohol use: Yes    Comment: Occasional use  . Drug use: No    Types: Marijuana    Comment: "quit months ago"  . Sexual activity: Yes    Birth control/protection: Surgical, Condom  Lifestyle  . Physical activity:    Days per week: Not on file    Minutes per session: Not on file  . Stress: Not on file  Relationships  . Social connections:    Talks on phone: Not on file    Gets together: Not on file    Attends religious service: Not on file    Active member of club or organization: Not on file    Attends meetings of clubs or organizations: Not  on file    Relationship status: Not on file  . Intimate partner violence:    Fear of current or ex partner: Not on file    Emotionally abused: Not on file    Physically abused: Not on file    Forced sexual activity: Not on file  Other Topics Concern  . Not on file  Social History Narrative   Lives at home with four children. (11/2012: Twins age 53, 27 year old and 42 year old. 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           Family History  Problem Relation Age of Onset  . Diabetes Father     ROS- All systems are reviewed and negative except as per the HPI above  Physical Exam: Vitals:   04/08/18 1055  BP: 110/76  Pulse: 77  Weight: 208 lb (94.3 kg)  Height:  (1.676 m)   Wt Readings from Last 3 Encounters:  04/08/18 208  lb (94.3 kg)  01/25/17 210 lb 0.6 oz (95.3 kg)  02/01/16 220 lb 3 oz (99.9 kg)    Labs: Lab Results  Component Value Date   NA 139 04/01/2018   K 4.3 04/01/2018   CL 107 04/01/2018   CO2 22 04/01/2018   GLUCOSE 91 04/01/2018   BUN 9 04/01/2018   CREATININE 0.69 04/01/2018   CALCIUM 9.3 04/01/2018   MG 2.1 04/01/2018   Lab Results  Component Value Date   INR 1.02 09/15/2015   Lab Results  Component Value Date   CHOL 170 06/01/2009   HDL 46 06/01/2009   LDLCALC 112 (H) 06/01/2009   TRIG 59 06/01/2009     GEN- The patient is well appearing, alert and oriented x 3 today.   Head- normocephalic, atraumatic Eyes-  Sclera clear, conjunctiva pink Ears- hearing intact Oropharynx- clear Neck- supple, no JVP Lymph- no cervical lymphadenopathy Lungs- Clear to ausculation bilaterally, normal work of breathing Heart- Regular rate and rhythm, no murmurs, rubs or gallops, PMI not laterally displaced GI- soft, NT, ND, + BS Extremities- no clubbing, cyanosis, or edema MS- no significant deformity or atrophy Skin- no rash or lesion Psych- euthymic mood, full affect Neuro- strength and sensation are intact  EKG- NSR at 77 bpm, PR int 158 ms, qrs int 72 ms, qtc 434 ms Epic records reviewed    Assessment and Plan: 1. Paroxysmal afib General education re afib Triggers discussed Reminded to limit alcohol How to take flecainide pill in pocket reviewed  2. Chadsvasc  score of  1 (female) No anticoagulation is needed at this time  F/u with Dr. Antoine Poche (missed last appointment )  will get reestablished in the next 6 months  Lupita Leash C. Matthew Folks [facility] [address] [place], [place] [address] [phone]

## 2019-01-20 ENCOUNTER — Encounter: Payer: Self-pay | Admitting: Family Medicine

## 2019-01-21 ENCOUNTER — Ambulatory Visit: Payer: Medicaid Other | Admitting: Family Medicine

## 2019-01-30 ENCOUNTER — Ambulatory Visit: Payer: Medicaid Other | Admitting: Family Medicine

## 2019-02-11 ENCOUNTER — Encounter: Payer: Self-pay | Admitting: Family Medicine

## 2019-02-12 ENCOUNTER — Encounter: Payer: Self-pay | Admitting: Family Medicine

## 2019-02-12 ENCOUNTER — Ambulatory Visit (INDEPENDENT_AMBULATORY_CARE_PROVIDER_SITE_OTHER): Payer: Self-pay | Admitting: Family Medicine

## 2019-02-12 VITALS — BP 114/79 | HR 86 | Temp 98.1°F | Resp 17 | Ht 66.0 in | Wt 212.0 lb

## 2019-02-12 DIAGNOSIS — Z1389 Encounter for screening for other disorder: Secondary | ICD-10-CM

## 2019-02-12 DIAGNOSIS — I480 Paroxysmal atrial fibrillation: Secondary | ICD-10-CM

## 2019-02-12 DIAGNOSIS — A048 Other specified bacterial intestinal infections: Secondary | ICD-10-CM

## 2019-02-12 DIAGNOSIS — Z3202 Encounter for pregnancy test, result negative: Secondary | ICD-10-CM

## 2019-02-12 DIAGNOSIS — Z131 Encounter for screening for diabetes mellitus: Secondary | ICD-10-CM

## 2019-02-12 DIAGNOSIS — K529 Noninfective gastroenteritis and colitis, unspecified: Secondary | ICD-10-CM

## 2019-02-12 DIAGNOSIS — K219 Gastro-esophageal reflux disease without esophagitis: Secondary | ICD-10-CM

## 2019-02-12 DIAGNOSIS — G8929 Other chronic pain: Secondary | ICD-10-CM

## 2019-02-12 DIAGNOSIS — Z7689 Persons encountering health services in other specified circumstances: Secondary | ICD-10-CM

## 2019-02-12 DIAGNOSIS — T148XXA Other injury of unspecified body region, initial encounter: Secondary | ICD-10-CM

## 2019-02-12 DIAGNOSIS — R1011 Right upper quadrant pain: Secondary | ICD-10-CM

## 2019-02-12 LAB — POCT URINALYSIS DIP (CLINITEK)
BILIRUBIN UA: NEGATIVE
Glucose, UA: NEGATIVE mg/dL
Ketones, POC UA: NEGATIVE mg/dL
LEUKOCYTES UA: NEGATIVE
NITRITE UA: NEGATIVE
POC PROTEIN,UA: NEGATIVE
RBC UA: NEGATIVE
Spec Grav, UA: >=1.03 — AB (ref 1.010–1.025)
Urobilinogen, UA: 0.2 E.U./dL
pH, UA: 5.5 (ref 5.0–8.0)

## 2019-02-12 LAB — POCT URINE PREGNANCY: Preg Test, Ur: NEGATIVE

## 2019-02-12 NOTE — Progress Notes (Signed)
Melanie Vincent, is a 43 y.o. female  VOJ:[id number]  XHB:[id number]  DOB - 1976/08/23  CC:  Chief Complaint  Patient presents with  . Establish Care  . Abdominal Pain    possible pulled muscle. dad is bedridden & pain started after transferring him  . Bleeding/Bruising    hit her R calf 1 year ago & the bruise has not gone away. is now starting to get tender       HPI: [name] is a 43 y.o. female is here today to establish care.   [name] has Overweight; GERD (gastroesophageal reflux disease); Well woman exam (no gynecological exam); Herpes labialis; Back pain; Dysuria; PAF (paroxysmal atrial fibrillation) ([place]); Anxiety reaction; and Sleep apnea suspected on their problem list.   Abdominal pain Patient has a history of abdominal pain. Of recent pain is mostly located to  right upper quadrant. She is also experiencing intermittent pigastric pain which she relates to a muscle strain pain sustained over one month ago while pulling her father up in bed who is a total care. 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 She lost health insurance after assuming full time care for her father and no longer working full time. She had an abnormal CT of abdomin which was significant for colitis. She was referred to [facility] gastroenterology however this was during the time that she lost her insurance and never received the EGD that was recommended.  She reports a history of H. pylori in which she had a reaction to medication and treatment was stopped in the middle prior to total completion.  Symptoms she describes today includes cramping and burning pain mostly localized to the right upper quadrant.  She denies any nausea however endorses the last 3 months history of mucousy type loose stools. She reports she has not had a formed stool in several prior history of peptic ulcer disease.  Months.  She denies any nausea or vomiting.  Patient has history of 3 episodes of atrial fibrillation.  he has  been followed by GI in the past.  He is not anticoagulant given low CHADVASC score. She has had three episodes of atrial fibrillations. Two episodes related to severe stress with excessive intake of caffeine. Third episodes was triggered by wine. She has been prescribed flecainide for atrial fibrillation when it occurs. She has not required medication recently. She has been followed by cardiology in the past. Mother has history atrial fibrillation. No family history of MI or known CAD.  Patient denies new headaches, chest pain, abdominal pain, nausea, new weakness , numbness or tingling, SOB, edema, or worrisome cough. She is concerned about bruising of lower extremity. Reports she hit her calf months ago and continues to experience a bruise. She reports intermittent bruising that seems to linger. Denies any history of low platelet count.    Current medications: Current Outpatient Medications:  .  valACYclovir (VALTREX) 1000 MG tablet, Take 2,000 mg by mouth See admin instructions. Take '2000mg'$  at onset of symptoms and repeat '2000mg'$  in 12 hours as needed, Disp: , Rfl:  .  flecainide (TAMBOCOR) 150 MG tablet, Take 2 tablets by mouth as needed for palpitations. (Patient not taking: Reported on 02/12/2019), Disp: 6 tablet, Rfl: 2    Pertinent family medical history: family history includes Diabetes in her father, paternal grandfather, and paternal grandmother; Heart disease in her paternal grandfather and paternal grandmother; Hypertension in her father; Stroke in her father.   Allergies  Allergen Reactions  . Tramadol Other (See  Comments)    Pt states that this medication caused insomnia.    Social History   Socioeconomic History  . Marital status: Single    Spouse name: Not on file  . Number of children: 4  . Years of education: Not on file  . Highest education level: Not on file  Occupational History  . Occupation: Caregiver  Social Needs  . Financial resource strain: Not on file  . Food  insecurity:    Worry: Not on file    Inability: Not on file  . Transportation needs:    Medical: Not on file    Non-medical: Not on file  Tobacco Use  . Smoking status: Former Smoker    Last attempt to quit: 06/15/2007    Years since quitting: 11.6  . Smokeless tobacco: Never Used  Substance and Sexual Activity  . Alcohol use: Yes    Comment: Occasional use  . Drug use: Not Currently    Types: Marijuana    Comment: "quit months ago"  . Sexual activity: Yes    Birth control/protection: Surgical, Condom  Lifestyle  . Physical activity:    Days per week: Not on file    Minutes per session: Not on file  . Stress: Not on file  Relationships  . Social connections:    Talks on phone: Not on file    Gets together: Not on file    Attends religious service: Not on file    Active member of club or organization: Not on file    Attends meetings of clubs or organizations: Not on file    Relationship status: Not on file  . Intimate partner violence:    Fear of current or ex partner: Not on file    Emotionally abused: Not on file    Physically abused: Not on file    Forced sexual activity: Not on file  Other Topics Concern  . Not on file  Social History Narrative   Lives at home with four children. (11/2012: Twins age 5, 55 year old and 43 year old. 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           Review of Systems: Pertinent negatives listed in HPI Objective:   Vitals:   02/12/19 1350  BP: 114/79  Pulse: 86  Resp: 17  Temp: 98.1 F (36.7 C)  SpO2: 98%    BP Readings from Last 3 Encounters:  02/12/19 114/79  04/08/18 110/76  04/01/18 122/86    Filed Weights   02/12/19 1350  Weight: 212 lb (96.2 kg)      Physical Exam: Constitutional: Patient appears well-developed and well-nourished. No distress. HENT: Normocephalic, atraumatic, External right and left ear normal. Oropharynx is clear and moist.  Eyes: Conjunctivae and EOM are normal.  PERRLA, no scleral icterus. Neck: Normal ROM. Neck supple. No JVD. No tracheal deviation. No thyromegaly. CVS: irregular rhythm normal rate  S1/S2 +, no gallops, no carotid bruit.  Pulmonary: Effort and breath sounds normal, no stridor, rhonchi, wheezes, rales.  Abdominal: Soft. BS +, no distension, tenderness, rebound or guarding.  Musculoskeletal: Normal range of motion. No edema and no tenderness.  Neuro: Alert. Normal muscle tone coordination. Normal gait. No cranial nerve deficit. Skin: Skin is warm and dry. Bruising of lower right extremity present Psychiatric: Normal mood and affect. Behavior, judgment, thought content normal.  Lab Results (prior encounters)  Lab Results  Component Value Date   WBC 9.3 04/01/2018   HGB  13.4 04/01/2018   HCT 40.9 04/01/2018   MCV 89.7 04/01/2018   PLT 480 (H) 04/01/2018   Lab Results  Component Value Date   CREATININE 0.69 04/01/2018   BUN 9 04/01/2018   NA 139 04/01/2018   K 4.3 04/01/2018   CL 107 04/01/2018   CO2 22 04/01/2018    No results found for: HGBA1C     Component Value Date/Time   CHOL 170 06/01/2009 2050   TRIG 59 06/01/2009 2050   HDL 46 06/01/2009 2050   CHOLHDL 3.7 Ratio 06/01/2009 2050   VLDL 12 06/01/2009 2050   LDLCALC 112 (H) 06/01/2009 2050        Assessment and plan:  1. Encounter to establish care 2. Screening for blood or protein in urine - POCT URINALYSIS DIP (CLINITEK) - POCT urine pregnancy  3. Chronic RUQ pain - H. pylori breath test, repeating will treat empirically if positive  - CBC with Differential - Amylase - Lipid panel - Lipase - Ambulatory referral to Gastroenterology  4. Gastroesophageal reflux disease without esophagitis -resume omeprazole  - CMP14+EGFR - Ambulatory referral to Gastroenterology  5. Screening for diabetes mellitus - CMP14+EGFR  6. Colitis, history of  Repeat CT of abdomen, given recent changes in stool consistently.  CT to rule out acute Colitis  - CBC  with Differential - Ambulatory referral to Gastroenterology  7. PAF (paroxysmal atrial fibrillation) (HCC) Irregular heart sound, - EKG 12-Lead-NSR no ischemic changes noted on evaluation of ECG Follow-up with cardiology- if flutter recurs   8. Bruising - CBC with Differential - Iron, TIBC and Ferritin Panel    Return in about 8 weeks (around 04/09/2019) for abdominal follow-up .   The patient was given clear instructions to go to ER or return to medical center if symptoms don't improve, worsen or new problems develop. The patient verbalized understanding. The patient was advised  to call and obtain lab results if they haven't heard anything from out office within 7-10 business days.  Molli Barrows, FNP Primary Care at [facility] [address], [place] [address] [phone]: [phone]   This note has been created with Dragon speech recognition software and [employer]er, materials Any transcriptional errors are unintentional.

## 2019-02-12 NOTE — Patient Instructions (Addendum)
Thank you for choosing Primary Care at [facility] to be your medical home!    Melanie Vincent was seen by Melanie Courts, FNP today.   Melanie Vincent primary care provider is Melanie Neighbors, FNP.   For the best care possible, you should try to see Melanie Courts, FNP-C whenever you come to the clinic.   We look forward to seeing you again soon!  If you have any questions about your visit today, please call us at [phone] or feel free to reach your primary care provider via MyChart.     Repeating CT of Abdomin to evaluation cause of GI symptoms and follow-up on prior diagnosis of Colitis. You will be contacted once study is scheduled.  Colitis  Colitis is inflammation of the colon. Colitis may last a short time (be acute), or it may last a long time (become chronic). What are the causes? This condition may be caused by:  Viruses.  Bacteria.  Reaction to medicine.  Certain autoimmune diseases such as Crohn's disease or ulcerative colitis.  Radiation treatment.  Decreased blood flow to the bowel (ischemia). What are the signs or symptoms? Symptoms of this condition include:  Watery diarrhea.  Passing bloody or tarry stool.  Pain.  Fever.  Vomiting.  Tiredness (fatigue).  Weight loss.  Bloating.  Abdominal pain.  Having fewer bowel movements than usual.  A strong and sudden urge to have a bowel movement.  Feeling like the bowel is not empty after a bowel movement. How is this diagnosed? This condition is diagnosed with a stool test or a blood test. You may also have other tests, such as:  X-rays.  CT scan.  Colonoscopy.  Endoscopy.  Biopsy. How is this treated? Treatment for this condition depends on the cause. The condition may be treated by:  Resting the bowel. This involves not eating or drinking for a period of time.  Fluids that are given through an IV.  Medicine for pain and diarrhea.  Antibiotic  medicines.  Cortisone medicines.  Surgery. Follow these instructions at home: Eating and drinking   Follow instructions from your health care provider about eating or drinking restrictions.  Drink enough fluid to keep your urine pale yellow.  Work with a dietitian to determine which foods cause your condition to flare up.  Avoid foods that cause flare-ups.  Eat a well-balanced diet. General instructions  If you were prescribed an antibiotic medicine, take it as told by your health care provider. Do not stop taking the antibiotic even if you start to feel better.  Take over-the-counter and prescription medicines only as told by your health care provider.  Keep all follow-up visits as told by your health care provider. This is important. Contact a health care provider if:  Your symptoms do not go away.  You develop new symptoms. Get help right away if you:  Have a fever that does not go away with treatment.  Develop chills.  Have extreme weakness, fainting, or dehydration.  Have repeated vomiting.  Develop severe pain in your abdomen.  Pass bloody or tarry stool. Summary  Colitis is inflammation of the colon. Colitis may last a short time (be acute), or it may last a long time (become chronic).  Treatment for this condition depends on the cause and may include resting the bowel, taking medicines, or having surgery.  If you were prescribed an antibiotic medicine, take it as told by your health care provider. Do not stop taking the antibiotic even  if you start to feel better.  Get help right away if you develop severe pain in your abdomen.  Keep all follow-up visits as told by your health care provider. This is important. This information is not intended to replace advice given to you by your health care provider. Make sure you discuss any questions you have with your health care provider. Document Released: 01/04/2005 Document Revised: 05/30/2018 Document Reviewed:  05/30/2018 Elsevier Interactive Patient Education  2019 [employer].

## 2019-02-13 LAB — LIPID PANEL
CHOL/HDL RATIO: 3.8 ratio (ref 0.0–4.4)
Cholesterol, Total: 216 mg/dL — ABNORMAL HIGH (ref 100–199)
HDL: 57 mg/dL (ref >39)
LDL CALC: 144 mg/dL — AB (ref 0–99)
Triglycerides: 73 mg/dL (ref 0–149)
VLDL CHOLESTEROL CAL: 15 mg/dL (ref 5–40)

## 2019-02-13 LAB — H. PYLORI BREATH TEST: H PYLORI BREATH TEST: POSITIVE — AB

## 2019-02-13 LAB — CMP14+EGFR
ALT: 18 IU/L (ref 0–32)
AST: 18 IU/L (ref 0–40)
Albumin/Globulin Ratio: 1.8 (ref 1.2–2.2)
Albumin: 4.8 g/dL (ref 3.8–4.8)
Alkaline Phosphatase: 55 IU/L (ref 39–117)
BUN/Creatinine Ratio: 16 (ref 9–23)
BUN: 12 mg/dL (ref 6–24)
Bilirubin Total: <0.2 mg/dL (ref 0.0–1.2)
CALCIUM: 10 mg/dL (ref 8.7–10.2)
CO2: 20 mmol/L (ref 20–29)
CREATININE: 0.73 mg/dL (ref 0.57–1.00)
Chloride: 104 mmol/L (ref 96–106)
GFR calc Af Amer: 117 mL/min/{1.73_m2} (ref >59)
GFR calc non Af Amer: 102 mL/min/{1.73_m2} (ref >59)
GLUCOSE: 90 mg/dL (ref 65–99)
Globulin, Total: 2.7 g/dL (ref 1.5–4.5)
Potassium: 4 mmol/L (ref 3.5–5.2)
Sodium: 139 mmol/L (ref 134–144)
Total Protein: 7.5 g/dL (ref 6.0–8.5)

## 2019-02-13 LAB — CBC WITH DIFFERENTIAL/PLATELET
BASOS ABS: 0 10*3/uL (ref 0.0–0.2)
Basos: 0 %
EOS (ABSOLUTE): 0.1 10*3/uL (ref 0.0–0.4)
Eos: 1 %
Hematocrit: 40.1 % (ref 34.0–46.6)
Hemoglobin: 12.8 g/dL (ref 11.1–15.9)
IMMATURE GRANULOCYTES: 1 %
Immature Grans (Abs): 0 10*3/uL (ref 0.0–0.1)
LYMPHS ABS: 2.6 10*3/uL (ref 0.7–3.1)
Lymphs: 34 %
MCH: 28.7 pg (ref 26.6–33.0)
MCHC: 31.9 g/dL (ref 31.5–35.7)
MCV: 90 fL (ref 79–97)
MONOCYTES: 6 %
MONOS ABS: 0.5 10*3/uL (ref 0.1–0.9)
NEUTROS PCT: 58 %
Neutrophils Absolute: 4.4 10*3/uL (ref 1.4–7.0)
Platelets: 397 10*3/uL (ref 150–450)
RBC: 4.46 x10E6/uL (ref 3.77–5.28)
RDW: 12.8 % (ref 11.7–15.4)
WBC: 7.6 10*3/uL (ref 3.4–10.8)

## 2019-02-13 LAB — IRON,TIBC AND FERRITIN PANEL
Ferritin: 44 ng/mL (ref 15–150)
Iron Saturation: 19 % (ref 15–55)
Iron: 76 ug/dL (ref 27–159)
TIBC: 399 ug/dL (ref 250–450)
UIBC: 323 ug/dL (ref 131–425)

## 2019-02-13 LAB — AMYLASE: Amylase: 55 U/L (ref 31–110)

## 2019-02-13 LAB — LIPASE: Lipase: 23 U/L (ref 14–72)

## 2019-02-14 ENCOUNTER — Telehealth: Payer: Self-pay | Admitting: Family Medicine

## 2019-02-14 MED ORDER — AMOXICILLIN 500 MG PO CAPS: 1000.0000 mg | ORAL_CAPSULE | Freq: Two times a day (BID) | ORAL | 0 refills | Status: DC

## 2019-02-14 MED ORDER — CLARITHROMYCIN 500 MG PO TABS: 500.0000 mg | ORAL_TABLET | Freq: Two times a day (BID) | ORAL | 0 refills | Status: DC

## 2019-02-14 MED ORDER — OMEPRAZOLE 40 MG PO CPDR: 40.0000 mg | DELAYED_RELEASE_CAPSULE | Freq: Two times a day (BID) | ORAL | 0 refills | Status: DC

## 2019-02-14 NOTE — Telephone Encounter (Signed)
Notify patient that her recent H. pylori test was positive.  I would like to initiate treatment immediately as we need to resolve this infection prior to scheduling any form of a CT of the abdomen.  I am sending the following medications to her pharmacy if she has difficulty purchasing medications due to cost please follow-up and let us know we can send to [facility] for her to pick up.  I am prescribing the following:  Amoxicillin 1000 mg twice daily x14 days Clarithromycin 500 mg twice daily x14 days Omeprazole 40 mg twice daily x14 days  Given this is her second occurrence of H. pylori we will retest her at her follow-up visit in April.

## 2019-02-14 NOTE — Telephone Encounter (Signed)
Left voice mail to call back 

## 2019-02-17 MED ORDER — OMEPRAZOLE 40 MG PO CPDR: 40.0000 mg | DELAYED_RELEASE_CAPSULE | Freq: Two times a day (BID) | ORAL | 0 refills | Status: DC

## 2019-02-17 MED ORDER — CLARITHROMYCIN 500 MG PO TABS: 500.0000 mg | ORAL_TABLET | Freq: Two times a day (BID) | ORAL | 0 refills | Status: AC

## 2019-02-17 MED ORDER — AMOXICILLIN 500 MG PO CAPS: 1000.0000 mg | ORAL_CAPSULE | Freq: Two times a day (BID) | ORAL | 0 refills | Status: AC

## 2019-02-17 MED FILL — CLARITHROMYCIN 500 MG TAB: 500 | 14 days supply | Qty: 28 | Fill #0

## 2019-02-17 MED FILL — AMOXICILLIN 500 MG CAPSULE: 500 | 14 days supply | Qty: 56 | Fill #0

## 2019-02-17 MED FILL — OMEPRAZOLE DR 40 MG CAPSULE: 40 | 28 days supply | Qty: 28 | Fill #0

## 2019-02-17 NOTE — Telephone Encounter (Signed)
Patient notified of lab results & recommendations. Expressed understanding. Per patient request I sent the medications for H. Pylori treatment to [facility].  Patient aware that we will retest at her follow up appointment in April.

## 2019-04-09 ENCOUNTER — Encounter: Payer: Self-pay | Admitting: Family Medicine

## 2019-04-09 ENCOUNTER — Ambulatory Visit (INDEPENDENT_AMBULATORY_CARE_PROVIDER_SITE_OTHER): Payer: Self-pay | Admitting: Family Medicine

## 2019-04-09 DIAGNOSIS — K529 Noninfective gastroenteritis and colitis, unspecified: Secondary | ICD-10-CM

## 2019-04-09 DIAGNOSIS — G8929 Other chronic pain: Secondary | ICD-10-CM

## 2019-04-09 DIAGNOSIS — J0191 Acute recurrent sinusitis, unspecified: Secondary | ICD-10-CM

## 2019-04-09 DIAGNOSIS — M546 Pain in thoracic spine: Secondary | ICD-10-CM

## 2019-04-09 DIAGNOSIS — R1011 Right upper quadrant pain: Secondary | ICD-10-CM

## 2019-04-09 DIAGNOSIS — K219 Gastro-esophageal reflux disease without esophagitis: Secondary | ICD-10-CM

## 2019-04-09 DIAGNOSIS — A048 Other specified bacterial intestinal infections: Secondary | ICD-10-CM

## 2019-04-09 MED ORDER — AMOXICILLIN-POT CLAVULANATE 875-125 MG PO TABS: 1.0000 | ORAL_TABLET | Freq: Two times a day (BID) | ORAL | 0 refills | Status: DC

## 2019-04-09 MED ORDER — TIZANIDINE HCL 4 MG PO CAPS: 4.0000 mg | ORAL_CAPSULE | Freq: Three times a day (TID) | ORAL | 2 refills | Status: DC

## 2019-04-09 MED ORDER — VALACYCLOVIR HCL 1 G PO TABS: 2000.0000 mg | ORAL_TABLET | ORAL | 3 refills | Status: DC

## 2019-04-09 MED ORDER — OMEPRAZOLE 40 MG PO CPDR: 40.0000 mg | DELAYED_RELEASE_CAPSULE | Freq: Every day | ORAL | 3 refills | Status: DC

## 2019-04-09 MED FILL — valACYclovir HCL 1 GM TABS: 1 | 7 days supply | Qty: 30 | Fill #0

## 2019-04-09 MED FILL — OMEPRAZOLE DR 40 MG CAPSULE: 40 | 30 days supply | Qty: 30 | Fill #0

## 2019-04-09 MED FILL — tiZANidine HCL 4 MG TABS: 4 | 30 days supply | Qty: 90 | Fill #0

## 2019-04-09 MED FILL — AMOX-CLAV 875-125 MG TABLET: 875-125 | 10 days supply | Qty: 20 | Fill #0

## 2019-04-09 NOTE — Addendum Note (Signed)
Addended by: Bing Neighbors on: 04/09/2019 04:57 PM   Modules accepted: Orders

## 2019-04-09 NOTE — Progress Notes (Signed)
Virtual Visit via Telephone Note  I connected with Melanie Vincent on 04/09/19 at  1:30 PM EDT by telephone and verified that I am speaking with the correct person using two identifiers.  Location: Patient: Patient located at home during today's encounter Provider: Provider is located at primary care office   I discussed the limitations, risks, security and privacy concerns of performing an evaluation and management service by telephone and the availability of in person appointments. I also discussed with the patient that there may be a patient responsible charge related to this service. The patient expressed understanding and agreed to proceed.   History of Present Illness: Abdominal Pain Follow-up Melanie Vincent was last seen in office on 02/12/19 for evaluation of reoccurring abdominal pain and loose stools. She has history significant for colitis and h pylori. She was retested for H pylori and referred to gastroenterology. H. Pylori positive 02/12/19. She was treated for infection with Amoxicillin 1000 mg twice daily x14 days, Clarithromycin 500 mg twice daily x14 days, and Omeprazole 40 mg twice daily x14 days. She reports symptoms completely resolved while taking medication, however abdominal discomfort returned shortly after completing therapy. Loose stools have resolved. She has had nausea and two episodes of vomiting related to two weeks of sinus symptoms. She has not experienced any unintentional weight loss,  Bloody/tarry stools, or changes in appetite. She is keeping a mental log of foods that worsen abdominal pain such as pork and dairy. She has an episodes of indigestion a few days ago which improved with one dose of omeprazole. She would like to resume chronic PPI therapy in efforts to reduce recurrent GI symptoms.   Chronic sinusitis History of chronic allergies and chronic sinusitis. Reports over the last two weeks experiencing a frontal headache, facials pressure above the eyes, nasal  congestion, itchy throat, post nasal drainage, and two episode of vomiting. She takes benadryl, OTC sinus medications, and zyrtec without significant relief. She endorses fatigue. Headache is not intermittent and other symptoms wax and wane. She has remained afebrile and denies chills.   Chronic back pain Previously followed by [facility] for management of back pain. 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 Reports that her back has began to bother her again. Due to chronicity of abdominal issues, she has stopped NSAIDS. Tizanidine has worked well in the past and she is requesting to restart medication previously prescribed for chronic back pain. Denies cauda equina symptoms or issues with gait.  Assessment and Plan: 1. Chronic RUQ pain -Improved after after treatment for H. pylori however she continues to have intolerances to certain foods which worsens abdominal pain symptoms.  I would recommend a GI work-up including a possible EGD , repeat CT of abdomen, possible colonoscopy . Due to COVID-19 all non-emergent studies are on hold until June. CT of abdomen from 08/13/2016 was significant for inflammatory changes of the descending colon. Will resume Omeprazole 40 mg once daily for management of suspected GERD.  2. H. pylori infection, recurrent  Appears to be resolved.  Patient has had no further loose or runny stools.    3. Chronic bilateral thoracic back pain -Resume tizanidine 4 mg 3 times daily as needed  4. Acute recurrent sinusitis, unspecified location -Start Augmentin 1 tablet BID x 10 days -Recommend consistent allergy management with cetrizine 10 mg at bedtime -Hydrate well with water.    Of note patient did inquire regarding COVID-19 testing although she is asymptomatic. Explained that due  to limit availability of tests, testing is only being performed in the ER for patients that exhibit symptoms. Patient verbalized understanding.   Follow Up  Instructions: Follow-up with gastroenterology Follow-up with PCP as needed.   I discussed the assessment and treatment plan with the patient. The patient was provided an opportunity to ask questions and all were answered. The patient agreed with the plan and demonstrated an understanding of the instructions.   The patient was advised to call back or seek an in-person evaluation if the symptoms worsen or if the condition fails to improve as anticipated.  I provided 20 minutes of non-face-to-face time during this encounter.   Joaquin CourtsKimberly Bianey Tesoro, FNP [facility] [address], [place] [id number] [phone]: [phone]

## 2019-04-10 ENCOUNTER — Ambulatory Visit: Payer: Self-pay | Admitting: Family Medicine

## 2019-04-11 ENCOUNTER — Ambulatory Visit (INDEPENDENT_AMBULATORY_CARE_PROVIDER_SITE_OTHER): Payer: Self-pay | Admitting: Gastroenterology

## 2019-04-11 ENCOUNTER — Other Ambulatory Visit: Payer: Self-pay

## 2019-04-11 ENCOUNTER — Encounter: Payer: Self-pay | Admitting: Gastroenterology

## 2019-04-11 VITALS — Ht 66.0 in | Wt 212.0 lb

## 2019-04-11 DIAGNOSIS — R1310 Dysphagia, unspecified: Secondary | ICD-10-CM

## 2019-04-11 DIAGNOSIS — R1011 Right upper quadrant pain: Secondary | ICD-10-CM

## 2019-04-11 NOTE — Progress Notes (Signed)
TELEHEALTH VISIT  Referring Provider: Bing Neighbors, FNP Primary Care Physician:  Bing Neighbors, FNP   Tele-visit due to COVID-19 pandemic Patient requested visit virtually, consented to the virtual encounter via video enabled telemedicine application (Doximity, converted to audio because the audio portion was not working) Contact made at: 2:41 PM 04/11/19 Patient verified by name and date of birth Location of patient: Home Location provider: [facility] Names of persons participating: Me, patient, Priscella Mann CMA Time spent on telehealth visit: 28 minutes I discussed the limitations of evaluation and management by telemedicine. The patient expressed understanding and agreed to proceed.  Reason for Consultation:  Worsening reflux, RUQ tenderness   IMPRESSION:  RUQ pain H pylori +    - initially diagnosed on blood test 7 years ago, treated with triple therapy    - found to have a positive H pylori breath test 02/12/19    - treated with amoxicillin 1000 mg twice daily, clarithromycin 500 mg twice daily and omeprazole 40 mg twice daily for 14 days    -Symptoms improved while on antibiotics but returned on cessation Intermittent solid food dysphagia Colitis on CT scan 9/32017    - involved sigmoid colon to splenic flexure Father with colon polyps Paternal aunt with colon cancer (in her 76s)  Etiology of RUQ pain is unclear. Symptoms improved on two weeks of treatment for H pylori. Differential includes: incompletely treated H pylori, or another process that was incompletely treated with two weeks of BID PPI such as esophagitis, gastritis, or PUD. The concurrent dysphagia makes esophagitis more likely. Must also consider functional etiologies.   PLAN: EGD with esophageal biopsies +/- dilation and colonoscopy when Covid19 restrictions are lifted Continue omeprazole 40 mg daily x 8 weeks Obtain prior records from [facility]  I consented the patient discussing  the risks, benefits, and alternatives to endoscopic evaluation. In particular, we discussed the risks that include, but are not limited to, reaction to medication, cardiopulmonary compromise, bleeding requiring blood transfusion, aspiration resulting in pneumonia, perforation requiring surgery, lack of diagnosis, severe illness requiring hospitalization, and even death. We reviewed the risk of missed lesion including polyps or even cancer. The patient acknowledges these risks and asks that we proceed.   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  The history is obtained through the patient and review of her electronic health record. Previously evaluated by [facility].  She has history CT diagnosed colitis and h pylori. Previously on omeprazole and pantoprazole for GERD.   Twenty years of GI issues. Most recently having abdominal pain and loose stools off and on for a few months. She was retested for H pylori and referred to gastroenterology. H. Pylori positive 02/12/19. She was treated for infection with Amoxicillin 1000 mg twice daily x14 days, Clarithromycin 500 mg twice daily x14 days, and Omeprazole 40 mg twice daily x14 days. She reports symptoms completely resolved while taking medication, however abdominal discomfort returned shortly after completion. Loose stools have resolved. She has had nausea and two episodes of vomiting related to two weeks of sinus symptoms. She has not experienced any unintentional weight loss,  Bloody/tarry stools, or changes in appetite. She is keeping a mental log of foods that worsen abdominal pain such as pork and dairy. She has rare heartburn that responds to omeprazole PRN.  Also concerned about a change  in stool caliber over the last several months: smaller and less frequent than in the past. No blood. Some mucous. No frank diarrhea or  constipation.  Good appetite. Weight stable.  Occasionally feels like food does not go down with swallowing. Feels like there might be a "tightening." She wondered about a stricture. Must chase solids with a sip of water to pass the food bolus.   Uses acetominophen for DJD pain. Stopped using Advil in 3-4 months, as she was told not to use them any more.   A CT of the abdomen and pelvis with contrast 08/13/2016 obtained to evaluate severe left lower quadrant abdominal pain and bloody stools showed mild thickening of the wall of the colon from the splenic flexure to the sigmoid colon with minimal surrounding inflammatory change. Symptoms resolved spontaneously within a week. No further bleeding since that time.    Father with colon polyps. Paternal aunt with colon cancer in her 2s. No known family history of colon cancer or polyps. No family history of uterine/endometrial cancer, pancreatic cancer or gastric/stomach cancer.  Past Medical History:  Diagnosis Date  . Breast lump    Mammogram (last 2013)  . DDD (degenerative disc disease), lumbar   . Dysrhythmia 11/25/2014   NEW ONSET ATRIAL FIBRILATION  . GERD (gastroesophageal reflux disease)   . Herpes simplex labialis   . Palpitations 06/14/2012    Past Surgical History:  Procedure Laterality Date  . CESAREAN SECTION    . TUBAL LIGATION     2008    Current Outpatient Medications  Medication Sig Dispense Refill  . omeprazole (PRILOSEC) 40 MG capsule Take 1 capsule (40 mg total) by mouth daily. (Patient taking differently: Take 40 mg by mouth as needed. ) 30 capsule 3  . tiZANidine (ZANAFLEX) 4 MG capsule Take 1 capsule (4 mg total) by mouth 3 (three) times daily. 90 capsule 2  . valACYclovir (VALTREX) 1000 MG tablet Take 2 tablets (2,000 mg total) by mouth See admin instructions. Take  at onset of symptoms and repeat  in 12 hours as needed 30 tablet 3  . amoxicillin-clavulanate (AUGMENTIN) 875-125 MG tablet Take 1 tablet  by mouth 2 (two) times daily. (Patient not taking: Reported on 04/11/2019) 20 tablet 0   No current facility-administered medications for this visit.     Allergies as of 04/11/2019 - Review Complete 04/11/2019  Allergen Reaction Noted  . Tramadol Other (See Comments) 05/28/2014    Family History  Problem Relation Age of Onset  . Diabetes Father   . Hypertension Father   . Stroke Father   . Diabetes Paternal Grandmother   . Heart disease Paternal Grandmother   . Diabetes Paternal Grandfather   . Heart disease Paternal Grandfather     Social History   Socioeconomic History  . Marital status: Single    Spouse name: Not on file  . Number of children: 4  . Years of education: Not on file  . Highest education level: Not on file  Occupational History  . Occupation: Caregiver  Social Needs  . Financial resource strain: Not on file  . Food insecurity:    Worry: Not on file    Inability: Not on file  . Transportation needs:    Medical: Not on file    Non-medical: Not on file  Tobacco Use  . Smoking status: Former Smoker    Last attempt to quit: 06/15/2007    Years since quitting: 11.8  . Smokeless tobacco: Never Used  Substance and Sexual  Activity  . Alcohol use: Yes    Comment: Occasional use  . Drug use: Not Currently    Types: Marijuana    Comment: "quit months ago"  . Sexual activity: Yes    Birth control/protection: Surgical, Condom  Lifestyle  . Physical activity:    Days per week: Not on file    Minutes per session: Not on file  . Stress: Not on file  Relationships  . Social connections:    Talks on phone: Not on file    Gets together: Not on file    Attends religious service: Not on file    Active member of club or organization: Not on file    Attends meetings of clubs or organizations: Not on file    Relationship status: Not on file  . Intimate partner violence:    Fear of current or ex partner: Not on file    Emotionally abused: Not on file     Physically abused: Not on file    Forced sexual activity: Not on file  Other Topics Concern  . Not on file  Social History Narrative   Lives at home with four children. (11/2012: Twins age 195, 43 year old and 43 year old. 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           Review of Systems: ALL ROS discussed and all others negative except listed in HPI. Bruise on her leg that has persisted despite a year since injury (evaluated in primary care office). Some itchy eyes attributed to seasonal allergies. Chronic back pain. No extra-GI manifestations of IBD.    Physical Exam: General: in no acute distress Neuro: Alert and appropriate Psych: Normal affect and normal insight   Shellia Hartl L. Orvan FalconerBeavers, MD, MPH [facility] 04/11/2019, 2:34 PM

## 2019-04-11 NOTE — Patient Instructions (Addendum)
If you are age 43 or older, your body mass index should be between 23-30. Your Body mass index is 34.22 kg/m. If this is out of the aforementioned range listed, please consider follow up with your Primary Care Provider.  If you are age 66 or younger, your body mass index should be between 19-25. Your Body mass index is 34.22 kg/m. If this is out of the aformentioned range listed, please consider follow up with your Primary Care Provider.   I am recommending an upper endoscopy and colonoscopy for further evaluation when the Covid19 restrictions are lifted. We will contact you regarding procedures when the schedule becomes available.  Continue to take Omeprazole 40 mg every day, ideally 30-50 minutes before breakfast. I recommend that you take it every day for 8 weeks.   Continue to avoid antiinflammatory medications such Advil and Aleve.   Requesting records from [facility].  Thank you for your patience with me and our technology today! Please stay home, safe, and healthy. I look forward to meeting you in person in the future.   Tressia Danas, MD

## 2019-06-25 ENCOUNTER — Encounter: Payer: Self-pay | Admitting: Gastroenterology

## 2019-07-23 ENCOUNTER — Other Ambulatory Visit: Payer: Self-pay

## 2019-07-23 ENCOUNTER — Ambulatory Visit (AMBULATORY_SURGERY_CENTER): Payer: Self-pay | Admitting: *Deleted

## 2019-07-23 VITALS — Ht 66.5 in | Wt 205.0 lb

## 2019-07-23 DIAGNOSIS — R1310 Dysphagia, unspecified: Secondary | ICD-10-CM

## 2019-07-23 DIAGNOSIS — R1011 Right upper quadrant pain: Secondary | ICD-10-CM

## 2019-07-23 MED ORDER — NA SULFATE-K SULFATE-MG SULF 17.5-3.13-1.6 GM/177ML PO SOLN: ORAL | 0 refills | Status: DC

## 2019-07-23 MED FILL — SUPREP BOWEL PREP KIT: 17.5-3.13-1 | 1 days supply | Qty: 354 | Fill #0

## 2019-07-23 NOTE — Progress Notes (Signed)
Patient's pre-visit was done today over the phone with the patient due to COVID-19 pandemic. Name,DOB and address verified. Insurance verified. Packet of Prep instructions mailed to patient including copy of a consent form and pre-procedure patient acknowledgement form-pt is aware.  Patient understands to call us back with any questions or concerns. Patient denies any allergies to eggs or soy. Patient denies any problems with anesthesia/sedation. Patient denies any oxygen use at home. Patient denies taking any diet/weight loss medications or blood thinners. EMMI education assisgned to patient on colonoscopy& EGD, this was explained and instructions given to patient. Pt is aware that care partner will wait in the car during procedure; if they feel like they will be too hot to wait in the car; they may wait in the lobby.  We want them to wear a mask (we do not have any that we can provide them), practice social distancing, and we will check their temperatures when they get here.  I did remind patient that their care partner needs to stay in the parking lot the entire time. Pt will wear mask into building.

## 2019-07-28 ENCOUNTER — Other Ambulatory Visit: Payer: Self-pay

## 2019-07-28 ENCOUNTER — Ambulatory Visit: Payer: Self-pay | Attending: Family Medicine

## 2019-07-30 MED FILL — tiZANidine HCL 4 MG TABS: 4 | 30 days supply | Qty: 90 | Fill #1

## 2019-07-30 MED FILL — valACYclovir HCL 1 GM TABS: 1 | 7 days supply | Qty: 30 | Fill #1

## 2019-08-01 ENCOUNTER — Encounter: Payer: Self-pay | Admitting: Gastroenterology

## 2019-08-05 ENCOUNTER — Telehealth: Payer: Self-pay | Admitting: Gastroenterology

## 2019-08-05 NOTE — Telephone Encounter (Signed)

## 2019-08-06 ENCOUNTER — Other Ambulatory Visit: Payer: Self-pay

## 2019-08-06 ENCOUNTER — Encounter: Payer: Self-pay | Admitting: Gastroenterology

## 2019-08-06 ENCOUNTER — Ambulatory Visit (AMBULATORY_SURGERY_CENTER): Payer: Self-pay | Admitting: Gastroenterology

## 2019-08-06 VITALS — BP 113/80 | HR 82 | Temp 96.6°F | Resp 16 | Ht 66.0 in | Wt 212.0 lb

## 2019-08-06 DIAGNOSIS — Z1211 Encounter for screening for malignant neoplasm of colon: Secondary | ICD-10-CM

## 2019-08-06 DIAGNOSIS — R1319 Other dysphagia: Secondary | ICD-10-CM

## 2019-08-06 DIAGNOSIS — K3189 Other diseases of stomach and duodenum: Secondary | ICD-10-CM

## 2019-08-06 DIAGNOSIS — K219 Gastro-esophageal reflux disease without esophagitis: Secondary | ICD-10-CM

## 2019-08-06 DIAGNOSIS — K2970 Gastritis, unspecified, without bleeding: Secondary | ICD-10-CM

## 2019-08-06 DIAGNOSIS — Z800 Family history of malignant neoplasm of digestive organs: Secondary | ICD-10-CM

## 2019-08-06 DIAGNOSIS — Z8371 Family history of colonic polyps: Secondary | ICD-10-CM

## 2019-08-06 DIAGNOSIS — R1310 Dysphagia, unspecified: Secondary | ICD-10-CM

## 2019-08-06 DIAGNOSIS — R1011 Right upper quadrant pain: Secondary | ICD-10-CM

## 2019-08-06 MED ORDER — SODIUM CHLORIDE 0.9 % IV SOLN: 500.0000 mL | INTRAVENOUS | Status: DC

## 2019-08-06 NOTE — Op Note (Addendum)
[facility] Patient Name: Melanie CampsJennifer Tegethoff Procedure Date: 08/06/2019 9:29 AM MRN: [id number] Endoscopist: Tressia DanasKimberly Aleem Elza MD, MD Age: 43 Referring MD:  Date of Birth: 11/15/1976 Gender: Female Account #: [id number] Procedure:                Upper GI endoscopy Indications:              Intermittent solid food dysphagia                           RUQ pain                           H pylori +                           - initially diagnosed on blood test 7 years ago,                            treated with triple therapy                           - found to have a positive H pylori breath test                            02/12/19                           - treated with amoxicillin 1000 mg twice daily,                            clarithromycin 500 mg twice daily and omeprazole 40                            mg twice daily for 14 days                           -Symptoms improved while on antibiotics but                            returned on cessation Medicines:                See the Anesthesia note for documentation of the                            administered medications Procedure:                Pre-Anesthesia Assessment:                           - Prior to the procedure, a History and Physical                            was performed, and patient medications and                            allergies were reviewed. The patient's tolerance of  previous anesthesia was also reviewed. The risks                            and benefits of the procedure and the sedation                            options and risks were discussed with the patient.                            All questions were answered, and informed consent                            was obtained. Prior Anticoagulants: The patient has                            taken no previous anticoagulant or antiplatelet                            agents. ASA Grade Assessment: II - A patient with                      mild systemic disease. After reviewing the risks                            and benefits, the patient was deemed in                            satisfactory condition to undergo the procedure.                           After obtaining informed consent, the endoscope was                            passed under direct vision. Throughout the                            procedure, the patient's blood pressure, pulse, and                            oxygen saturations were monitored continuously. The                            Endoscope was introduced through the mouth, and                            advanced to the third part of duodenum. The upper                            GI endoscopy was accomplished without difficulty.                            The patient tolerated the procedure well. Scope In: Scope Out: Findings:                 The  examined esophagus was normal except the z-line                            was slightly irregular. Biopsies were taken with a                            cold forceps for histology from the distal                            esophagus as well as from the mid and proximal                            esophagus. Estimated blood loss was minimal. No                            ring, web, stricture, feline esophagus, or luminal                            abnormality.                           The entire examined stomach was normal. 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 Biopsies were taken with a cold forceps for                            histology from the antrum and the fundus given the                            history of H pylori. Estimated blood loss was                            minimal.                           The examined duodenum was normal. Biopsies were                            taken with a cold forceps for histology. Estimated                             blood loss was minimal.                           The cardia and gastric fundus were normal on                            retroflexion. Complications:            No immediate complications. Estimated blood loss:  Minimal. Estimated Blood Loss:     Estimated blood loss was minimal. Impression:               - Slightly irregular z-line. Otherwise, normal                            esophagus. Biopsied.                           - Normal stomach. Biopsied.                           - Normal examined duodenum. Biopsied. Recommendation:           - Patient has a contact number available for                            emergencies. The signs and symptoms of potential                            delayed complications were discussed with the                            patient. Return to normal activities tomorrow.                            Written discharge instructions were provided to the                            patient.                           - Resume previous diet today.                           - Continue present medications.                           - Await pathology results.                           - Follow-up appointment with me to review these                            results after the pathology results are available. Thornton Park MD, MD 08/06/2019 10:00:36 AM This report has been signed electronically.

## 2019-08-06 NOTE — Patient Instructions (Signed)
Please read handouts provided. Continue present medications.  Await pathology results. Please schedule a follow-up appointment after receiving pathology results.       YOU HAD AN ENDOSCOPIC PROCEDURE TODAY AT [facility]:   Refer to the procedure report that was given to you for any specific questions about what was found during the examination.  If the procedure report does not answer your questions, please call your gastroenterologist to clarify.  If you requested that your care partner not be given the details of your procedure findings, then the procedure report has been included in a sealed envelope for you to review at your convenience later.  YOU SHOULD EXPECT: Some feelings of bloating in the abdomen. Passage of more gas than usual.  Walking can help get rid of the air that was put into your GI tract during the procedure and reduce the bloating. If you had a lower endoscopy (such as a colonoscopy or flexible sigmoidoscopy) you may notice spotting of blood in your stool or on the toilet paper. If you underwent a bowel prep for your procedure, you may not have a normal bowel movement for a few days.  Please Note:  You might notice some irritation and congestion in your nose or some drainage.  This is from the oxygen used during your procedure.  There is no need for concern and it should clear up in a day or so.  SYMPTOMS TO REPORT IMMEDIATELY:   Following lower endoscopy (colonoscopy or flexible sigmoidoscopy):  Excessive amounts of blood in the stool  Significant tenderness or worsening of abdominal pains  Swelling of the abdomen that is new, acute  Fever of 100F or higher   Following upper endoscopy (EGD)  Vomiting of blood or coffee ground material  New chest pain or pain under the shoulder blades  Painful or persistently difficult swallowing  New shortness of breath  Fever of 100F or higher  Black, tarry-looking stools  For urgent or emergent issues, a  gastroenterologist can be reached at any hour by calling [phone].   DIET:  We do recommend a small meal at first, but then you may proceed to your regular diet.  Drink plenty of fluids but you should avoid alcoholic beverages for 24 hours.  ACTIVITY:  You should plan to take it easy for the rest of today and you should NOT DRIVE or use heavy machinery until tomorrow (because of the sedation medicines used during the test).    FOLLOW UP: Our staff will call the number listed on your records 48-72 hours following your procedure to check on you and address any questions or concerns that you may have regarding the information given to you following your procedure. If we do not reach you, we will leave a message.  We will attempt to reach you two times.  During this call, we will ask if you have developed any symptoms of COVID 19. If you develop any symptoms (ie: fever, flu-like symptoms, shortness of breath, cough etc.) before then, please call [phone].  If you test positive for Covid 19 in the 2 weeks post procedure, please call and report this information to [place].    If any biopsies were taken you will be contacted by phone or by letter within the next 1-3 weeks.  Please call us at [phone] if you have not heard about the biopsies in 3 weeks.    SIGNATURES/CONFIDENTIALITY: You and/or your care partner have signed paperwork which will be entered into  your electronic medical record.  These signatures attest to the fact that that the information above on your After Visit Summary has been reviewed and is understood.  Full responsibility of the confidentiality of this discharge information lies with you and/or your care-partner.

## 2019-08-06 NOTE — Op Note (Signed)
[facility] Patient Name: Melanie Vincent Procedure Date: 08/06/2019 9:28 AM MRN: [id number] Endoscopist: Tressia Danas MD, MD Age: 43 Referring MD:  Date of Birth: 01-10-1976 Gender: Female Account #: [id number] Procedure:                Colonoscopy Indications:              Colon cancer screening in patient at increased                            risk: Family history of 1st-degree relative with                            colon polyps                           Father with colon polyps                           Paternal aunt with colon cancer (in her 58s) Medicines:                See the Anesthesia note for documentation of the                            administered medications Procedure:                Pre-Anesthesia Assessment:                           - Prior to the procedure, a History and Physical                            was performed, and patient medications and                            allergies were reviewed. The patient's tolerance of                            previous anesthesia was also reviewed. The risks                            and benefits of the procedure and the sedation                            options and risks were discussed with the patient.                            All questions were answered, and informed consent                            was obtained. Prior Anticoagulants: The patient has                            taken no previous anticoagulant or antiplatelet  agents. ASA Grade Assessment: II - A patient with                            mild systemic disease. After reviewing the risks                            and benefits, the patient was deemed in                            satisfactory condition to undergo the procedure.                           After obtaining informed consent, the colonoscope                            was passed under direct vision. Throughout the   procedure, the patient's blood pressure, pulse, and                            oxygen saturations were monitored continuously. The                            Colonoscope was introduced through the anus and                            advanced to the the terminal ileum, with                            identification of the appendiceal orifice and IC                            valve. A second forward view of the right colon was                            performed. The colonoscopy was performed without                            difficulty. The patient tolerated the procedure                            well. The quality of the bowel preparation was                            excellent. The terminal ileum, ileocecal valve,                            appendiceal orifice, and rectum were photographed. Scope In: 9:43:24 AM Scope Out: 9:53:48 AM Scope Withdrawal Time: 0 hours 8 minutes 20 seconds  Total Procedure Duration: 0 hours 10 minutes 24 seconds  Findings:                 The perianal and digital rectal examinations were  normal.                           Non-bleeding internal hemorrhoids were found. The                            hemorrhoids were small.                           The exam was otherwise without abnormality on                            direct and retroflexion views. Complications:            No immediate complications. Estimated Blood Loss:     Estimated blood loss: none. Impression:               - Non-bleeding internal hemorrhoids.                           - The examination was otherwise normal on direct                            and retroflexion views.                           - No specimens collected. Recommendation:           - Patient has a contact number available for                            emergencies. The signs and symptoms of potential                            delayed complications were discussed with the                             patient. Return to normal activities tomorrow.                            Written discharge instructions were provided to the                            patient.                           - Resume previous diet today.                           - Continue present medications.                           - Repeat colonoscopy in 5 years for screening                            purposes given the family history. Tressia DanasKimberly Daniell Paradise MD, MD 08/06/2019 10:03:54 AM This report has been signed electronically.

## 2019-08-06 NOTE — Progress Notes (Addendum)
Melanie Vincent - temp [name] - VS   Pt's states no medical or surgical changes since previsit or office visit.   

## 2019-08-06 NOTE — Progress Notes (Signed)
Called to room to assist during endoscopic procedure.  Patient ID and intended procedure confirmed with present staff. Received instructions for my participation in the procedure from the performing physician.  

## 2019-08-06 NOTE — Progress Notes (Signed)
Report to PACU, RN, vss, BBS= Clear.  

## 2019-08-08 ENCOUNTER — Telehealth: Payer: Self-pay | Admitting: *Deleted

## 2019-08-08 NOTE — Telephone Encounter (Signed)
1. Have you developed a fever since your procedure? no  2.   Have you had an respiratory symptoms (SOB or cough) since your procedure? no  3.   Have you tested positive for COVID 19 since your procedure no  4.   Have you had any family members/close contacts diagnosed with the COVID 19 since your procedure?  no   If yes to any of these questions please route to Joylene John, RN and Alphonsa Gin, Therapist, sports.    Follow up Call-  Call back number 08/06/2019  Post procedure Call Back phone  # [phone] cell  Permission to leave phone message Yes  Some recent data might be hidden     Patient questions:  Do you have a fever, pain , or abdominal swelling? No. Pain Score  0 *  Have you tolerated food without any problems? Yes.    Have you been able to return to your normal activities? Yes.    Do you have any questions about your discharge instructions: Diet   No. Medications  No. Follow up visit  No.  Do you have questions or concerns about your Care? No.  Actions: * If pain score is 4 or above: No action needed, pain <4.

## 2019-08-12 ENCOUNTER — Encounter: Payer: Self-pay | Admitting: *Deleted

## 2019-08-14 ENCOUNTER — Encounter: Payer: Self-pay | Admitting: *Deleted

## 2019-09-11 ENCOUNTER — Ambulatory Visit: Payer: Self-pay | Admitting: Gastroenterology

## 2019-09-30 ENCOUNTER — Telehealth: Payer: Self-pay

## 2019-09-30 NOTE — Telephone Encounter (Signed)
Called patient to do their pre-visit COVID screening.  Patient states that she went to urgent care for UTI symptoms. Would like to cancel appointment.

## 2019-10-01 ENCOUNTER — Ambulatory Visit: Payer: Self-pay

## 2019-11-11 MED FILL — tiZANidine HCL 4 MG TABS: 4 | 30 days supply | Qty: 90 | Fill #2

## 2019-11-11 MED FILL — metroNIDAZOLE 0.75 % GEL: 0.75 | 5 days supply | Qty: 70 | Fill #0

## 2019-11-11 MED FILL — OMEPRAZOLE DR 40 MG CAPSULE: 40 | 30 days supply | Qty: 30 | Fill #1

## 2019-11-21 MED FILL — OMEPRAZOLE DR 40 MG CAPSULE: 40 | 30 days supply | Qty: 30 | Fill #1

## 2019-11-21 MED FILL — tiZANidine HCL 4 MG TABS: 4 | 30 days supply | Qty: 90 | Fill #2

## 2019-11-21 MED FILL — metroNIDAZOLE 0.75 % GEL: 0.75 | 5 days supply | Qty: 70 | Fill #0

## 2019-12-24 ENCOUNTER — Emergency Department (HOSPITAL_COMMUNITY)
Admission: EM | Admit: 2019-12-24 | Discharge: 2019-12-24 | Disposition: A | Discharge status: Home / Self Care | Payer: Self-pay | Attending: Emergency Medicine | Admitting: Emergency Medicine

## 2019-12-24 ENCOUNTER — Encounter (HOSPITAL_COMMUNITY): Payer: Self-pay | Admitting: Emergency Medicine

## 2019-12-24 ENCOUNTER — Other Ambulatory Visit: Payer: Self-pay

## 2019-12-24 ENCOUNTER — Emergency Department (HOSPITAL_COMMUNITY): Payer: Self-pay

## 2019-12-24 DIAGNOSIS — M5412 Radiculopathy, cervical region: Secondary | ICD-10-CM | POA: Insufficient documentation

## 2019-12-24 DIAGNOSIS — Z87891 Personal history of nicotine dependence: Secondary | ICD-10-CM | POA: Insufficient documentation

## 2019-12-24 DIAGNOSIS — Z20822 Contact with and (suspected) exposure to covid-19: Secondary | ICD-10-CM | POA: Insufficient documentation

## 2019-12-24 DIAGNOSIS — R519 Headache, unspecified: Secondary | ICD-10-CM | POA: Insufficient documentation

## 2019-12-24 DIAGNOSIS — Z79899 Other long term (current) drug therapy: Secondary | ICD-10-CM | POA: Insufficient documentation

## 2019-12-24 LAB — CBC WITH DIFFERENTIAL/PLATELET
Abs Immature Granulocytes: 0.01 10*3/uL (ref 0.00–0.07)
Basophils Absolute: 0 10*3/uL (ref 0.0–0.1)
Basophils Relative: 0 %
Eosinophils Absolute: 0.1 10*3/uL (ref 0.0–0.5)
Eosinophils Relative: 1 %
HCT: 38.9 % (ref 36.0–46.0)
Hemoglobin: 12.6 g/dL (ref 12.0–15.0)
Immature Granulocytes: 0 %
Lymphocytes Relative: 35 %
Lymphs Abs: 2.5 10*3/uL (ref 0.7–4.0)
MCH: 29.7 pg (ref 26.0–34.0)
MCHC: 32.4 g/dL (ref 30.0–36.0)
MCV: 91.7 fL (ref 80.0–100.0)
Monocytes Absolute: 0.6 10*3/uL (ref 0.1–1.0)
Monocytes Relative: 8 %
Neutro Abs: 3.9 10*3/uL (ref 1.7–7.7)
Neutrophils Relative %: 56 %
Platelets: 404 10*3/uL — ABNORMAL HIGH (ref 150–400)
RBC: 4.24 MIL/uL (ref 3.87–5.11)
RDW: 14.3 % (ref 11.5–15.5)
WBC: 7 10*3/uL (ref 4.0–10.5)
nRBC: 0 % (ref 0.0–0.2)

## 2019-12-24 LAB — URINALYSIS, ROUTINE W REFLEX MICROSCOPIC
Bilirubin Urine: NEGATIVE
Glucose, UA: NEGATIVE mg/dL
Ketones, ur: NEGATIVE mg/dL
Leukocytes,Ua: NEGATIVE
Nitrite: NEGATIVE
Protein, ur: 30 mg/dL — AB
RBC / HPF: >50 RBC/hpf — ABNORMAL HIGH (ref 0–5)
Specific Gravity, Urine: 1.029 (ref 1.005–1.030)
pH: 5 (ref 5.0–8.0)

## 2019-12-24 LAB — COMPREHENSIVE METABOLIC PANEL
ALT: 21 U/L (ref 0–44)
AST: 19 U/L (ref 15–41)
Albumin: 3.9 g/dL (ref 3.5–5.0)
Alkaline Phosphatase: 46 U/L (ref 38–126)
Anion gap: 8 (ref 5–15)
BUN: 10 mg/dL (ref 6–20)
CO2: 23 mmol/L (ref 22–32)
Calcium: 8.9 mg/dL (ref 8.9–10.3)
Chloride: 106 mmol/L (ref 98–111)
Creatinine, Ser: 0.68 mg/dL (ref 0.44–1.00)
GFR calc Af Amer: >60 mL/min (ref >60)
GFR calc non Af Amer: >60 mL/min (ref >60)
Glucose, Bld: 103 mg/dL — ABNORMAL HIGH (ref 70–99)
Potassium: 4.1 mmol/L (ref 3.5–5.1)
Sodium: 137 mmol/L (ref 135–145)
Total Bilirubin: 0.6 mg/dL (ref 0.3–1.2)
Total Protein: 6.7 g/dL (ref 6.5–8.1)

## 2019-12-24 LAB — RESPIRATORY PANEL BY RT PCR (FLU A&B, COVID)
Influenza A by PCR: NEGATIVE
Influenza B by PCR: NEGATIVE
SARS Coronavirus 2 by RT PCR: NEGATIVE

## 2019-12-24 LAB — PREGNANCY, URINE: Preg Test, Ur: NEGATIVE

## 2019-12-24 MED ORDER — HYDROCODONE-ACETAMINOPHEN 5-325 MG PO TABS: 1.0000 | ORAL_TABLET | ORAL | 0 refills | Status: DC | PRN

## 2019-12-24 MED ORDER — LORAZEPAM 2 MG/ML IJ SOLN
1.0000 mg | Freq: Once | INTRAMUSCULAR | Status: AC
  Administered 2019-12-24: 1 mg via INTRAVENOUS
  Filled 2019-12-24: qty 1

## 2019-12-24 MED ORDER — DEXAMETHASONE SODIUM PHOSPHATE 10 MG/ML IJ SOLN
10.0000 mg | Freq: Once | INTRAMUSCULAR | Status: AC
  Administered 2019-12-24: 10 mg via INTRAVENOUS
  Filled 2019-12-24: qty 1

## 2019-12-24 MED ORDER — SODIUM CHLORIDE 0.9 % IV BOLUS
1000.0000 mL | Freq: Once | INTRAVENOUS | Status: AC
  Administered 2019-12-24: 1000 mL via INTRAVENOUS

## 2019-12-24 MED ORDER — SODIUM CHLORIDE 0.9% FLUSH
3.0000 mL | Freq: Once | INTRAVENOUS | Status: AC
  Administered 2019-12-24: 3 mL via INTRAVENOUS

## 2019-12-24 MED ORDER — PREDNISONE 10 MG (21) PO TBPK: ORAL_TABLET | ORAL | 0 refills | Status: DC

## 2019-12-24 MED ORDER — ONDANSETRON HCL 4 MG/2ML IJ SOLN
4.0000 mg | Freq: Once | INTRAMUSCULAR | Status: AC
  Administered 2019-12-24: 4 mg via INTRAVENOUS
  Filled 2019-12-24: qty 2

## 2019-12-24 MED ORDER — DIAZEPAM 5 MG PO TABS: 5.0000 mg | ORAL_TABLET | Freq: Two times a day (BID) | ORAL | 0 refills | Status: DC

## 2019-12-24 MED ORDER — MORPHINE SULFATE (PF) 4 MG/ML IV SOLN
4.0000 mg | Freq: Once | INTRAVENOUS | Status: AC
  Administered 2019-12-24: 4 mg via INTRAVENOUS
  Filled 2019-12-24: qty 1

## 2019-12-24 NOTE — ED Provider Notes (Signed)
[facility] EMERGENCY DEPARTMENT Provider Note   CSN: [id number] Arrival date & time: 12/24/19  6761     History Chief Complaint  Patient presents with  . Headache  . Nausea    Melanie Vincent is a 44 y.o. female.  Pt presents to the ED with neck pain and headache.  Pt said she's had neck pain for the past 2 weeks.  Now it's radiating up into her head and down her right arm.  Pt denies f/c.  No sob.  She had a rapid covid test yesterday that was neg.        Past Medical History:  Diagnosis Date  . Anxiety   . Breast lump    Mammogram (last 2013)  . DDD (degenerative disc disease), lumbar   . Dysrhythmia 11/25/2014   NEW ONSET ATRIAL FIBRILATION  . GERD (gastroesophageal reflux disease)   . Herpes simplex labialis   . Palpitations 06/14/2012    Patient Active Problem List   Diagnosis Date Noted  . Sleep apnea suspected 10/06/2015  . Anxiety reaction 09/15/2015  . PAF (paroxysmal atrial fibrillation) ([place]) 11/25/2014  . Herpes labialis 07/29/2013  . Well woman exam (no gynecological exam) 05/02/2013  . GERD (gastroesophageal reflux disease) 11/13/2012  . Overweight 06/14/2012    Past Surgical History:  Procedure Laterality Date  . CESAREAN SECTION    . TUBAL LIGATION     2008     OB History    Gravida  6   Para  4   Term  4   Preterm      AB  2   Living  5     SAB      TAB  2   Ectopic      Multiple  1   Live Births           Obstetric Comments  Twins c-section in 2008        Family History  Problem Relation Age of Onset  . Diabetes Father   . Hypertension Father   . Stroke Father   . Colon polyps Father   . Diabetes Paternal Grandmother   . Heart disease Paternal Grandmother   . Diabetes Paternal Grandfather   . Heart disease Paternal Grandfather   . Colon cancer Paternal Aunt        late 67's  . Esophageal cancer Neg Hx   . Rectal cancer Neg Hx   . Stomach cancer Neg Hx     Social History    Tobacco Use  . Smoking status: Former Smoker    Quit date: 06/15/2007    Years since quitting: 12.5  . Smokeless tobacco: Never Used  Substance Use Topics  . Alcohol use: Yes    Alcohol/week: 2.0 standard drinks    Types: 2 Glasses of wine per week  . Drug use: Not Currently    Types: Marijuana    Comment: "quit months ago"    Home Medications Prior to Admission medications   Medication Sig Start Date End Date Taking? Authorizing Provider  diazepam (VALIUM) 5 MG tablet Take 1 tablet (5 mg total) by mouth 2 (two) times daily. 12/24/19   Isla Pence, MD  HYDROcodone-acetaminophen (NORCO/VICODIN) 5-325 MG tablet Take 1 tablet by mouth every 4 (four) hours as needed. 12/24/19   Isla Pence, MD  Multiple Vitamin (MULTIVITAMIN) tablet Take 1 tablet by mouth daily.    [provider]  omeprazole (PRILOSEC) 40 MG capsule Take 1 capsule (40 mg total)  by mouth daily. Patient taking differently: Take 40 mg by mouth as needed.  04/09/19   Bing Neighbors, FNP  predniSONE (STERAPRED UNI-PAK 21 TAB) 10 MG (21) TBPK tablet Take 6 tabs for 2 days, then 5 for 2 days, then 4 for 2 days, then 3 for 2 days, 2 for 2 days, then 1 for 2 days 12/24/19   Jacalyn Lefevre, MD  tiZANidine (ZANAFLEX) 4 MG capsule Take 1 capsule (4 mg total) by mouth 3 (three) times daily. 04/09/19   Bing Neighbors, FNP  valACYclovir (VALTREX) 1000 MG tablet Take 2 tablets (2,000 mg total) by mouth See admin instructions. Take 2000mg  at onset of symptoms and repeat 2000mg  in 12 hours as needed 04/09/19   , FNP    Allergies    Tramadol  Review of Systems   Review of Systems  Musculoskeletal: Positive for neck pain.  Neurological: Positive for headaches.  All other systems reviewed and are negative.   Physical Exam Updated Vital Signs BP 116/82 (BP Location: Left Arm)   Pulse 97   Temp 97.9 F (36.6 C) (Oral)   Resp 20   SpO2 98%   Physical Exam Vitals and nursing note reviewed.   Constitutional:      Appearance: She is well-developed.  HENT:     Head: Normocephalic and atraumatic.     Mouth/Throat:     Mouth: Mucous membranes are moist.     Pharynx: Oropharynx is clear.  Eyes:     Extraocular Movements: Extraocular movements intact.     Pupils: Pupils are equal, round, and reactive to light.  Neck:   Cardiovascular:     Rate and Rhythm: Normal rate and regular rhythm.  Pulmonary:     Effort: Pulmonary effort is normal.     Breath sounds: Normal breath sounds.  Abdominal:     General: Bowel sounds are normal.     Palpations: Abdomen is soft.  Musculoskeletal:        General: Normal range of motion.     Cervical back: Normal range of motion and neck supple.  Skin:    General: Skin is warm.     Capillary Refill: Capillary refill takes less than 2 seconds.  Neurological:     Mental Status: She is alert and oriented to person, place, and time.  Psychiatric:        Mood and Affect: Mood normal.        Speech: Speech normal.        Behavior: Behavior normal.     ED Results / Procedures / Treatments   Labs (all labs ordered are listed, but only abnormal results are displayed) Labs Reviewed  COMPREHENSIVE METABOLIC PANEL - Abnormal; Notable for the following components:      Result Value   Glucose, Bld 103 (*)    All other components within normal limits  CBC WITH DIFFERENTIAL/PLATELET - Abnormal; Notable for the following components:   Platelets 404 (*)    All other components within normal limits  URINALYSIS, ROUTINE W REFLEX MICROSCOPIC - Abnormal; Notable for the following components:   APPearance HAZY (*)    Hgb urine dipstick LARGE (*)    Protein, ur 30 (*)    RBC / HPF >50 (*)    Bacteria, UA RARE (*)    All other components within normal limits  RESPIRATORY PANEL BY RT PCR (FLU A&B, COVID)  PREGNANCY, URINE    EKG None  Radiology CT Head Wo Contrast  Result  Date: 12/24/2019 CLINICAL DATA:  Pt reports she began having neck pain  about 7 days ago, states that 2-3 days ago she started having a headache with tenderness around her temples and worsening pain when she turns her head side to side. Denies any fevers or chills, had negative covid test yesterday at her job. A/ox4, resp e/u, nad. Denies any sick contacts. EXAM: CT HEAD WITHOUT CONTRAST CT CERVICAL SPINE WITHOUT CONTRAST TECHNIQUE: Multidetector CT imaging of the head and cervical spine was performed following the standard protocol without intravenous contrast. Multiplanar CT image reconstructions of the cervical spine were also generated. COMPARISON:  None. FINDINGS: CT HEAD FINDINGS Brain: No evidence of acute infarction, hemorrhage, hydrocephalus, extra-axial collection or mass lesion/mass effect. Vascular: No hyperdense vessel or unexpected calcification. Skull: Normal. Negative for fracture or focal lesion. Sinuses/Orbits: Visualized globes and orbits are unremarkable. Visualized sinuses and mastoid air cells are clear. Other: None. CT CERVICAL SPINE FINDINGS Alignment: Mild kyphosis, apex at C4-C5. No spondylolisthesis/subluxation. Skull base and vertebrae: No acute fracture. No primary bone lesion or focal pathologic process. Soft tissues and spinal canal: No prevertebral fluid or swelling. No visible canal hematoma. Disc levels: Mild loss of disc height at C4-C5 and C5-C6. Small endplate osteophytes at C3, C4-C5 and C6. No significant disc bulging. No disc herniation. No significant stenosis. Upper chest: Negative. Other: None. IMPRESSION: HEAD CT 1. Normal. CERVICAL CT 1. No fracture or acute finding. 2. Minor disc degenerative changes as detailed. Electronically Signed   By: Amie Portland M.D.   On: 12/24/2019 12:11   CT Cervical Spine Wo Contrast  Result Date: 12/24/2019 CLINICAL DATA:  Pt reports she began having neck pain about 7 days ago, states that 2-3 days ago she started having a headache with tenderness around her temples and worsening pain when she turns her  head side to side. Denies any fevers or chills, had negative covid test yesterday at her job. A/ox4, resp e/u, nad. Denies any sick contacts. EXAM: CT HEAD WITHOUT CONTRAST CT CERVICAL SPINE WITHOUT CONTRAST TECHNIQUE: Multidetector CT imaging of the head and cervical spine was performed following the standard protocol without intravenous contrast. Multiplanar CT image reconstructions of the cervical spine were also generated. COMPARISON:  None. FINDINGS: CT HEAD FINDINGS Brain: No evidence of acute infarction, hemorrhage, hydrocephalus, extra-axial collection or mass lesion/mass effect. Vascular: No hyperdense vessel or unexpected calcification. Skull: Normal. Negative for fracture or focal lesion. Sinuses/Orbits: Visualized globes and orbits are unremarkable. Visualized sinuses and mastoid air cells are clear. Other: None. CT CERVICAL SPINE FINDINGS Alignment: Mild kyphosis, apex at C4-C5. No spondylolisthesis/subluxation. Skull base and vertebrae: No acute fracture. No primary bone lesion or focal pathologic process. Soft tissues and spinal canal: No prevertebral fluid or swelling. No visible canal hematoma. Disc levels: Mild loss of disc height at C4-C5 and C5-C6. Small endplate osteophytes at C3, C4-C5 and C6. No significant disc bulging. No disc herniation. No significant stenosis. Upper chest: Negative. Other: None. IMPRESSION: HEAD CT 1. Normal. CERVICAL CT 1. No fracture or acute finding. 2. Minor disc degenerative changes as detailed. Electronically Signed   By: Amie Portland M.D.   On: 12/24/2019 12:11    Procedures Procedures (including critical care time)  Medications Ordered in ED Medications  sodium chloride flush (NS) 0.9 % injection 3 mL (has no administration in time range)  LORazepam (ATIVAN) injection 1 mg (has no administration in time range)  sodium chloride 0.9 % bolus 1,000 mL (1,000 mLs Intravenous New Bag/Given 12/24/19 1109)  ondansetron (ZOFRAN) injection 4 mg (4 mg Intravenous  Given 12/24/19 1056)  morphine 4 MG/ML injection 4 mg (4 mg Intravenous Given 12/24/19 1102)  dexamethasone (DECADRON) injection 10 mg (10 mg Intravenous Given 12/24/19 1056)    ED Course  I have reviewed the triage vital signs and the nursing notes.  Pertinent labs & imaging results that were available during my care of the patient were reviewed by me and considered in my medical decision making (see chart for details).    MDM Rules/Calculators/A&P                     Pt is feeling better.  Sx are likely from a cervical radiculopathy.  She is re-swabbed for Covid.  Results are pending.  Return if worse.  F/u with pcp.  Final Clinical Impression(s) / ED Diagnoses Final diagnoses:  Cervical radiculopathy    Rx / DC Orders ED Discharge Orders         Ordered    predniSONE (STERAPRED UNI-PAK 21 TAB) 10 MG (21) TBPK tablet     12/24/19 1248    HYDROcodone-acetaminophen (NORCO/VICODIN) 5-325 MG tablet  Every 4 hours PRN     12/24/19 1248    diazepam (VALIUM) 5 MG tablet  2 times daily     12/24/19 1248           Jacalyn Lefevre, MD 12/24/19 1249

## 2019-12-24 NOTE — ED Triage Notes (Signed)
Pt reports she began having neck pain about 7 days ago, states that 2-3 days ago she started having a headache with tenderness around her temples and worsening pain when she turns her head side to side. Denies any fevers or chills, had negative covid test yesterday at her job. A/ox4, resp e/u, nad. Denies any sick contacts.

## 2020-03-12 ENCOUNTER — Ambulatory Visit: Payer: Self-pay

## 2020-03-15 ENCOUNTER — Other Ambulatory Visit: Payer: Self-pay | Admitting: Family Medicine

## 2020-03-18 ENCOUNTER — Ambulatory Visit: Payer: Self-pay

## 2020-03-20 ENCOUNTER — Ambulatory Visit: Payer: Self-pay

## 2020-03-25 ENCOUNTER — Ambulatory Visit: Payer: Self-pay | Attending: Internal Medicine

## 2020-03-25 DIAGNOSIS — Z23 Encounter for immunization: Secondary | ICD-10-CM

## 2020-03-25 NOTE — Progress Notes (Signed)
   Covid-19 Vaccination Clinic  Name:  HARU ANSPAUGH    MRN: [id number] DOB: 11-13-1976  03/25/2020  Ms. Kusek was observed post Covid-19 immunization for 15 minutes without incident. She was provided with Vaccine Information Sheet and instruction to access the V-Safe system.   Ms. Eveland was instructed to call 911 with any severe reactions post vaccine: Marland Kitchen Difficulty breathing  . Swelling of face and throat  . A fast heartbeat  . A bad rash all over body  . Dizziness and weakness   Immunizations Administered    Name Date Dose VIS Date Route   Pfizer COVID-19 Vaccine 03/25/2020 10:36 AM 0.3 mL 11/21/2019 Intramuscular   Manufacturer: [employer], [employer]   Lot: [id number]   NDC: [address]-0

## 2020-03-29 ENCOUNTER — Emergency Department (HOSPITAL_COMMUNITY): Payer: Medicaid Other

## 2020-03-29 ENCOUNTER — Encounter (HOSPITAL_COMMUNITY): Payer: Self-pay | Admitting: Emergency Medicine

## 2020-03-29 ENCOUNTER — Other Ambulatory Visit: Payer: Self-pay

## 2020-03-29 ENCOUNTER — Emergency Department (HOSPITAL_COMMUNITY)
Admission: EM | Admit: 2020-03-29 | Discharge: 2020-03-30 | Disposition: A | Discharge status: Home / Self Care | Payer: Medicaid Other | Attending: Emergency Medicine | Admitting: Emergency Medicine

## 2020-03-29 DIAGNOSIS — I4891 Unspecified atrial fibrillation: Secondary | ICD-10-CM | POA: Insufficient documentation

## 2020-03-29 DIAGNOSIS — Z79899 Other long term (current) drug therapy: Secondary | ICD-10-CM | POA: Diagnosis not present

## 2020-03-29 DIAGNOSIS — Z87891 Personal history of nicotine dependence: Secondary | ICD-10-CM | POA: Diagnosis not present

## 2020-03-29 DIAGNOSIS — R008 Other abnormalities of heart beat: Secondary | ICD-10-CM | POA: Diagnosis present

## 2020-03-29 DIAGNOSIS — R112 Nausea with vomiting, unspecified: Secondary | ICD-10-CM | POA: Insufficient documentation

## 2020-03-29 DIAGNOSIS — I480 Paroxysmal atrial fibrillation: Secondary | ICD-10-CM

## 2020-03-29 LAB — CBC
HCT: 39.1 % (ref 36.0–46.0)
Hemoglobin: 12.5 g/dL (ref 12.0–15.0)
MCH: 28.8 pg (ref 26.0–34.0)
MCHC: 32 g/dL (ref 30.0–36.0)
MCV: 90.1 fL (ref 80.0–100.0)
Platelets: 394 10*3/uL (ref 150–400)
RBC: 4.34 MIL/uL (ref 3.87–5.11)
RDW: 13.7 % (ref 11.5–15.5)
WBC: 9.4 10*3/uL (ref 4.0–10.5)
nRBC: 0 % (ref 0.0–0.2)

## 2020-03-29 LAB — BASIC METABOLIC PANEL
Anion gap: 11 (ref 5–15)
BUN: 13 mg/dL (ref 6–20)
CO2: 24 mmol/L (ref 22–32)
Calcium: 9.5 mg/dL (ref 8.9–10.3)
Chloride: 106 mmol/L (ref 98–111)
Creatinine, Ser: 0.66 mg/dL (ref 0.44–1.00)
GFR calc Af Amer: >60 mL/min (ref >60)
GFR calc non Af Amer: >60 mL/min (ref >60)
Glucose, Bld: 86 mg/dL (ref 70–99)
Potassium: 4 mmol/L (ref 3.5–5.1)
Sodium: 141 mmol/L (ref 135–145)

## 2020-03-29 LAB — I-STAT BETA HCG BLOOD, ED (MC, WL, AP ONLY): I-stat hCG, quantitative: <5 m[IU]/mL (ref <5)

## 2020-03-29 LAB — POC URINE PREG, ED: Preg Test, Ur: NEGATIVE

## 2020-03-29 LAB — TROPONIN I (HIGH SENSITIVITY): Troponin I (High Sensitivity): <2 ng/L (ref <18)

## 2020-03-29 MED ORDER — SODIUM CHLORIDE 0.9% FLUSH
3.0000 mL | Freq: Once | INTRAVENOUS | Status: AC
  Administered 2020-03-29: 3 mL via INTRAVENOUS

## 2020-03-29 MED ORDER — ONDANSETRON 4 MG PO TBDP
4.0000 mg | ORAL_TABLET | Freq: Once | ORAL | Status: AC
  Administered 2020-03-29: 23:00:00 4 mg via ORAL
  Filled 2020-03-29: qty 1

## 2020-03-29 MED ORDER — FLECAINIDE ACETATE 100 MG PO TABS
300.0000 mg | ORAL_TABLET | Freq: Once | ORAL | Status: AC
  Administered 2020-03-29: 23:00:00 300 mg via ORAL
  Filled 2020-03-29: qty 3

## 2020-03-29 MED ORDER — ONDANSETRON 4 MG PO TBDP: 4.0000 mg | ORAL_TABLET | Freq: Three times a day (TID) | ORAL | 0 refills | Status: DC | PRN

## 2020-03-29 NOTE — ED Provider Notes (Addendum)
[facility] EMERGENCY DEPARTMENT Provider Note   CSN: [id number] Arrival date & time: 03/29/20  2015     History Chief Complaint  Patient presents with  . Atrial Fibrillation  . Emesis    Melanie Vincent is a 44 y.o. female.  44yo M w/ PMH including paroxysmal A fib, GERD who p/w A fib and vomiting.  Patient ate dinner and then shortly after dinner around 7 PM, she was having a headache so she laid on the couch.  She then got up and was walking when she began feeling nauseated and suddenly began having vomiting and retching.  She also had diarrhea.  She began noting seeing a heart quivering sensation just after she finished vomiting which feels like previous episodes of atrial fibrillation.  She reports some mild associated shortness of breath, no complaints of pain.  No sick contacts or recent travel.  She does admit to having some alcohol earlier today but states that she did not drink excessively.  In the past, she has had atrial fibrillation triggered by alcohol use.  She was previously seen by cardiology and was given a prescription for flecainide to take if she went into A. fib but she notes that the prescription was so old that she did not want to take the medication.  She has not seen cardiology since 2019.  She denies any drug use.  The history is provided by the patient.  Atrial Fibrillation  Emesis      Past Medical History:  Diagnosis Date  . Anxiety   . Breast lump    Mammogram (last 2013)  . DDD (degenerative disc disease), lumbar   . Dysrhythmia 11/25/2014   NEW ONSET ATRIAL FIBRILATION  . GERD (gastroesophageal reflux disease)   . Herpes simplex labialis   . Palpitations 06/14/2012    Patient Active Problem List   Diagnosis Date Noted  . Sleep apnea suspected 10/06/2015  . Anxiety reaction 09/15/2015  . PAF (paroxysmal atrial fibrillation) (HCC) 11/25/2014  . Herpes labialis 07/29/2013  . Well woman exam (no gynecological exam) 05/02/2013   . GERD (gastroesophageal reflux disease) 11/13/2012  . Overweight 06/14/2012    Past Surgical History:  Procedure Laterality Date  . CESAREAN SECTION    . TUBAL LIGATION     2008     OB History    Gravida  6   Para  4   Term  4   Preterm      AB  2   Living  5     SAB      TAB  2   Ectopic      Multiple  1   Live Births           Obstetric Comments  Twins c-section in 2008        Family History  Problem Relation Age of Onset  . Diabetes Father   . Hypertension Father   . Stroke Father   . Colon polyps Father   . Diabetes Paternal Grandmother   . Heart disease Paternal Grandmother   . Diabetes Paternal Grandfather   . Heart disease Paternal Grandfather   . Colon cancer Paternal Aunt        late 29's  . Esophageal cancer Neg Hx   . Rectal cancer Neg Hx   . Stomach cancer Neg Hx     Social History   Tobacco Use  . Smoking status: Former Smoker    Quit date: 06/15/2007    Years  since quitting: 12.7  . Smokeless tobacco: Never Used  Substance Use Topics  . Alcohol use: Yes    Alcohol/week: 2.0 standard drinks    Types: 2 Glasses of wine per week  . Drug use: Not Currently    Types: Marijuana    Comment: "quit months ago"    Home Medications Prior to Admission medications   Medication Sig Start Date End Date Taking? Authorizing Provider  omeprazole (PRILOSEC) 40 MG capsule Take 1 capsule (40 mg total) by mouth daily. Patient taking differently: Take 40 mg by mouth as needed (for heartburn).  04/09/19  Yes Scot Jun, FNP  diazepam (VALIUM) 5 MG tablet Take 1 tablet (5 mg total) by mouth 2 (two) times daily. Patient not taking: Reported on 03/29/2020 12/24/19   Isla Pence, MD  HYDROcodone-acetaminophen (NORCO/VICODIN) 5-325 MG tablet Take 1 tablet by mouth every 4 (four) hours as needed. Patient not taking: Reported on 03/29/2020 12/24/19   Isla Pence, MD  ondansetron (ZOFRAN ODT) 4 MG disintegrating tablet Take 1 tablet (4 mg  total) by mouth every 8 (eight) hours as needed for nausea or vomiting. 03/29/20   Bern Fare, Wenda Overland, MD  predniSONE (STERAPRED UNI-PAK 21 TAB) 10 MG (21) TBPK tablet Take 6 tabs for 2 days, then 5 for 2 days, then 4 for 2 days, then 3 for 2 days, 2 for 2 days, then 1 for 2 days Patient not taking: Reported on 03/29/2020 12/24/19   Isla Pence, MD  tiZANidine (ZANAFLEX) 4 MG capsule Take 1 capsule (4 mg total) by mouth 3 (three) times daily. Patient not taking: Reported on 03/29/2020 04/09/19   Scot Jun, FNP  valACYclovir (VALTREX) 1000 MG tablet Take 2 tablets (2,000 mg total) by mouth See admin instructions. Take 2000mg  at onset of symptoms and repeat 2000mg  in 12 hours as needed Patient not taking: Reported on 03/29/2020 04/09/19   Scot Jun, FNP    Allergies    Tramadol  Review of Systems   Review of Systems  Gastrointestinal: Positive for vomiting.   All other systems reviewed and are negative except that which was mentioned in HPI  Physical Exam Updated Vital Signs BP 118/83   Pulse (!) 105   Temp 97.8 F (36.6 C) (Oral)   Resp 20   Ht 5\' 6"  (1.676 m)   Wt 90.7 kg   SpO2 96%   BMI 32.28 kg/m   Physical Exam Vitals and nursing note reviewed.  Constitutional:      General: She is not in acute distress.    Appearance: She is well-developed.  HENT:     Head: Normocephalic and atraumatic.  Eyes:     Conjunctiva/sclera: Conjunctivae normal.  Cardiovascular:     Rate and Rhythm: Normal rate. Rhythm irregularly irregular.     Heart sounds: Normal heart sounds. No murmur.  Pulmonary:     Effort: Pulmonary effort is normal.     Breath sounds: Normal breath sounds.  Abdominal:     General: Bowel sounds are increased. There is no distension.     Palpations: Abdomen is soft.     Tenderness: There is no abdominal tenderness.  Musculoskeletal:     Cervical back: Neck supple.  Skin:    General: Skin is warm and dry.  Neurological:     Mental Status:  She is alert and oriented to person, place, and time.     Comments: Fluent speech  Psychiatric:        Judgment: Judgment normal.  ED Results / Procedures / Treatments   Labs (all labs ordered are listed, but only abnormal results are displayed) Labs Reviewed  BASIC METABOLIC PANEL  CBC  HEPATIC FUNCTION PANEL  LIPASE, BLOOD  I-STAT BETA HCG BLOOD, ED ([facility], [facility], [facility] ONLY)  POC URINE PREG, ED  TROPONIN I (HIGH SENSITIVITY)  TROPONIN I (HIGH SENSITIVITY)    EKG EKG Interpretation  Date/Time:  Monday March 29 2020 21:16:12 EDT Ventricular Rate:  100 PR Interval:    QRS Duration: 82 QT Interval:  355 QTC Calculation: 458 R Axis:   88 Text Interpretation: Atrial fibrillation similar to previous Confirmed by Frederick Peers [phone] on 03/29/2020 9:18:23 PM   Radiology DG Chest 2 View  Result Date: 03/29/2020 CLINICAL DATA:  Chest pain EXAM: CHEST - 2 VIEW COMPARISON:  04/01/2018 FINDINGS: The heart size and mediastinal contours are within normal limits. Both lungs are clear. The visualized skeletal structures are unremarkable. IMPRESSION: No active cardiopulmonary disease. Electronically Signed   By: Jasmine Pang M.D.   On: 03/29/2020 22:14    Procedures Procedures (including critical care time)  Medications Ordered in ED Medications  sodium chloride flush (NS) 0.9 % injection 3 mL (3 mLs Intravenous Given 03/29/20 2306)  ondansetron (ZOFRAN-ODT) disintegrating tablet 4 mg (4 mg Oral Given 03/29/20 2307)  flecainide (TAMBOCOR) tablet 300 mg (300 mg Oral Given 03/29/20 2307)       ED Course  I have reviewed the triage vital signs and the nursing notes.  Pertinent labs that were available during my care of the patient were reviewed by me and considered in my medical decision making (see chart for details).    MDM Rules/Calculators/A&P                      Well-appearing and comfortable on exam, atrial fibrillation on EKG.  Heart rate variable but mostly around 100, no  severe RVR.  No focal abdominal tenderness.  She has a very clear timeline of A. fib starting after vomiting and diarrhea episodes.  She reports that flecainide has worked well in the past, therefore ordered Zofran followed by flecainide. Screening labwork reassuring.   I am signing pt out pending administration of flecanide and monitoring for response. If improved, I anticipate she can be discharged w/ cards f/u. I have cautioned patient that any alcohol use may trigger future episodes.  CHADSVASC score=1.  Final Clinical Impression(s) / ED Diagnoses Final diagnoses:  Paroxysmal atrial fibrillation [facility])    Rx / DC Orders ED Discharge Orders         Ordered    ondansetron (ZOFRAN ODT) 4 MG disintegrating tablet  Every 8 hours PRN     03/29/20 2305           Beulah Capobianco, Ambrose Finland, MD 03/29/20 2304    Clarene Duke, Ambrose Finland, MD 03/29/20 [phone]

## 2020-03-29 NOTE — ED Triage Notes (Signed)
Pt stated she started vomiting after dinner and felt her heart "go into AFIB".  Pt is now SOB and feels lightheadedness.  A/O

## 2020-03-29 NOTE — ED Notes (Signed)
Pt transported to XRAY °

## 2020-03-30 ENCOUNTER — Telehealth (HOSPITAL_COMMUNITY): Payer: Self-pay | Admitting: Nurse Practitioner

## 2020-03-30 LAB — HEPATIC FUNCTION PANEL
ALT: 24 U/L (ref 0–44)
AST: 22 U/L (ref 15–41)
Albumin: 4.2 g/dL (ref 3.5–5.0)
Alkaline Phosphatase: 46 U/L (ref 38–126)
Bilirubin, Direct: <0.1 mg/dL (ref 0.0–0.2)
Total Bilirubin: 0.6 mg/dL (ref 0.3–1.2)
Total Protein: 7.2 g/dL (ref 6.5–8.1)

## 2020-03-30 LAB — LIPASE, BLOOD: Lipase: 22 U/L (ref 11–51)

## 2020-03-30 LAB — TROPONIN I (HIGH SENSITIVITY): Troponin I (High Sensitivity): 2 ng/L (ref <18)

## 2020-03-30 MED FILL — ONDANSETRON ODT 4 MG TABLET: 4 | 2 days supply | Qty: 6 | Fill #0

## 2020-03-30 NOTE — ED Provider Notes (Signed)
I assumed care in signout to follow-up on patient's response to flecainide.  Patient has episodes of paroxysmal atrial fibrillation that would typically respond to flecainide.  Her CHA2DS2-VASc score is very low and she does not require anticoagulation. On reassessment, patient is now improved and back in sinus rhythm.  Labs reviewed.  Patient feels comfortable for discharge home.  She will follow up with cardiology.   EKG Interpretation  Date/Time:  Tuesday March 30 2020 01:01:20 EDT Ventricular Rate:  83 PR Interval:    QRS Duration: 108 QT Interval:  411 QTC Calculation: 483 R Axis:   91 Text Interpretation: Sinus rhythm Borderline prolonged PR interval Consider left atrial enlargement Borderline right axis deviation afib resolved, now in SR Confirmed by Zadie Rhine ([id number]) on 03/30/2020 1:13:34 AM         Zadie Rhine, MD 03/30/20 [phone]

## 2020-03-30 NOTE — Telephone Encounter (Signed)
We received referral from [facility] where pt was seen for episode of Afib.  Pt last saw Rudi Coco, NP on 04/08/18 in the AFib Clinic.  Called to schedule follow up appt for patient.  Her VM is full, unable to leave any messages.  Will call back.

## 2020-03-30 NOTE — ED Notes (Signed)
Patient verbalizes understanding of discharge instructions. Opportunity for questioning and answers were provided. Armband removed by staff, pt discharged from ED ambulatory to home.  

## 2020-03-31 ENCOUNTER — Other Ambulatory Visit: Payer: Self-pay

## 2020-03-31 ENCOUNTER — Encounter (HOSPITAL_COMMUNITY): Payer: Self-pay | Admitting: Nurse Practitioner

## 2020-03-31 ENCOUNTER — Ambulatory Visit (HOSPITAL_COMMUNITY)
Admission: RE | Admit: 2020-03-31 | Discharge: 2020-03-31 | Disposition: A | Discharge status: Home / Self Care | Payer: Medicaid Other | Source: Ambulatory Visit | Attending: Nurse Practitioner | Admitting: Nurse Practitioner

## 2020-03-31 VITALS — BP 104/70 | HR 76 | Ht 66.0 in | Wt 207.8 lb

## 2020-03-31 DIAGNOSIS — B001 Herpesviral vesicular dermatitis: Secondary | ICD-10-CM | POA: Diagnosis not present

## 2020-03-31 DIAGNOSIS — Z87891 Personal history of nicotine dependence: Secondary | ICD-10-CM | POA: Insufficient documentation

## 2020-03-31 DIAGNOSIS — M5136 Other intervertebral disc degeneration, lumbar region: Secondary | ICD-10-CM | POA: Diagnosis not present

## 2020-03-31 DIAGNOSIS — I480 Paroxysmal atrial fibrillation: Secondary | ICD-10-CM | POA: Insufficient documentation

## 2020-03-31 DIAGNOSIS — Z79899 Other long term (current) drug therapy: Secondary | ICD-10-CM | POA: Insufficient documentation

## 2020-03-31 DIAGNOSIS — D6869 Other thrombophilia: Secondary | ICD-10-CM

## 2020-03-31 DIAGNOSIS — Z8249 Family history of ischemic heart disease and other diseases of the circulatory system: Secondary | ICD-10-CM | POA: Diagnosis not present

## 2020-03-31 MED ORDER — DILTIAZEM HCL 30 MG PO TABS: ORAL_TABLET | ORAL | 1 refills | Status: DC

## 2020-03-31 MED ORDER — FLECAINIDE ACETATE 100 MG PO TABS: ORAL_TABLET | ORAL | 0 refills | Status: DC

## 2020-03-31 MED FILL — FLECAINIDE ACETATE 100 MG T: 100 | 12 days supply | Qty: 9 | Fill #0

## 2020-03-31 MED FILL — dilTIAZem HCL 30 MG TABS: 30 | 30 days supply | Qty: 45 | Fill #0

## 2020-03-31 NOTE — Patient Instructions (Signed)
If you go into afib... Start with Cardizem 30mg  can take 1 tablet every 4 hours as needed for heart rates over 100   If afib persists - take flecainide 300mg  once -- you can only repeat every 4 days.

## 2020-03-31 NOTE — Progress Notes (Signed)
Primary Care Physician: Bing Neighbors, FNP Referring Physician: ER f/u    Melanie Vincent is a 44 y.o. female with a h/o paroxysmal afib that is in the afib cliinc for f/u after being seen in the ER on Monday for afib. She developed a h/a laid down to rest, got up, had N/V and went  into afib. She  had some alcohol earlier in the day and this has been a trigger in the past. She  has used PIP flecainide in the past but it had been so long since using the RX, that the pills had crumbled.  She  converted with 300 mg flecainide in the ER yesterday.States that her mother had early onset of afib. Mom has MVP. Ekg today shows SR. CHA2DS2VASc score is 1. No anticoagulation is needed by guidelines.   Today, she denies symptoms of palpitations, chest pain, shortness of breath, orthopnea, PND, lower extremity edema, dizziness, presyncope, syncope, or neurologic sequela. The patient is tolerating medications without difficulties and is otherwise without complaint today.   Past Medical History:  Diagnosis Date  . Anxiety   . Breast lump    Mammogram (last 2013)  . DDD (degenerative disc disease), lumbar   . Dysrhythmia 11/25/2014   NEW ONSET ATRIAL FIBRILATION  . GERD (gastroesophageal reflux disease)   . Herpes simplex labialis   . Palpitations 06/14/2012   Past Surgical History:  Procedure Laterality Date  . CESAREAN SECTION    . TUBAL LIGATION     2008    Current Outpatient Medications  Medication Sig Dispense Refill  . omeprazole (PRILOSEC) 40 MG capsule Take 1 capsule (40 mg total) by mouth daily. (Patient taking differently: Take 40 mg by mouth as needed (for heartburn). ) 30 capsule 3  . ondansetron (ZOFRAN ODT) 4 MG disintegrating tablet Take 1 tablet (4 mg total) by mouth every 8 (eight) hours as needed for nausea or vomiting. 6 tablet 0  . valACYclovir (VALTREX) 1000 MG tablet Take 1,000 mg by mouth 2 (two) times daily.    Marland Kitchen diltiazem (CARDIZEM) 30 MG tablet Take 1 tablet every  4 hours AS NEEDED for heart rate >100 as long as blood pressure >100. 45 tablet 1  . flecainide (TAMBOCOR) 100 MG tablet Take 300mg  by mouth once as needed at onset of afib can repeat every 4 days only 9 tablet 0   No current facility-administered medications for this encounter.    Allergies  Allergen Reactions  . Tramadol Other (See Comments)    Pt states that this medication caused insomnia.    Social History   Socioeconomic History  . Marital status: Single    Spouse name: Not on file  . Number of children: 4  . Years of education: Not on file  . Highest education level: Not on file  Occupational History  . Occupation: Caregiver  Tobacco Use  . Smoking status: Former Smoker    Quit date: 06/15/2007    Years since quitting: 12.8  . Smokeless tobacco: Never Used  Substance and Sexual Activity  . Alcohol use: Yes    Alcohol/week: 4.0 standard drinks    Types: 2 Glasses of wine, 2 Standard drinks or equivalent per week  . Drug use: Yes    Types: Marijuana    Comment: "quit months ago"  . Sexual activity: Yes    Birth control/protection: Surgical, Condom  Other Topics Concern  . Not on file  Social History Narrative   Lives at home with four children. (  11/2012: Twins age 21, 32 year old and 44 year old. 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          Social Determinants of Health   Financial Resource Strain:   . Difficulty of Paying Living Expenses:   Food Insecurity:   . Worried About Programme researcher, broadcasting/film/video in the Last Year:   . Barista in the Last Year:   Transportation Needs:   . Freight forwarder (Medical):   Marland Kitchen Lack of Transportation (Non-Medical):   Physical Activity:   . Days of Exercise per Week:   . Minutes of Exercise per Session:   Stress:   . Feeling of Stress :   Social Connections:   . Frequency of Communication with Friends and Family:   . Frequency of Social Gatherings with Friends and Family:   . Attends  Religious Services:   . Active Member of Clubs or Organizations:   . Attends Banker Meetings:   Marland Kitchen Marital Status:   Intimate Partner Violence:   . Fear of Current or Ex-Partner:   . Emotionally Abused:   Marland Kitchen Physically Abused:   . Sexually Abused:     Family History  Problem Relation Age of Onset  . Diabetes Father   . Hypertension Father   . Stroke Father   . Colon polyps Father   . Diabetes Paternal Grandmother   . Heart disease Paternal Grandmother   . Diabetes Paternal Grandfather   . Heart disease Paternal Grandfather   . Colon cancer Paternal Aunt        late 59's  . Esophageal cancer Neg Hx   . Rectal cancer Neg Hx   . Stomach cancer Neg Hx     ROS- All systems are reviewed and negative except as per the HPI above  Physical Exam: Vitals:   03/31/20 0936  BP: 104/70  Pulse: 76  Weight: 94.3 kg  Height: 5\' 6"  (1.676 m)   Wt Readings from Last 3 Encounters:  03/31/20 94.3 kg  03/29/20 90.7 kg  08/06/19 96.2 kg    Labs: Lab Results  Component Value Date   NA 141 03/29/2020   K 4.0 03/29/2020   CL 106 03/29/2020   CO2 24 03/29/2020   GLUCOSE 86 03/29/2020   BUN 13 03/29/2020   CREATININE 0.66 03/29/2020   CALCIUM 9.5 03/29/2020   MG 2.1 04/01/2018   Lab Results  Component Value Date   INR 1.02 09/15/2015   Lab Results  Component Value Date   CHOL 216 (H) 02/12/2019   HDL 57 02/12/2019   LDLCALC 144 (H) 02/12/2019   TRIG 73 02/12/2019     GEN- The patient is well appearing, alert and oriented x 3 today.   Head- normocephalic, atraumatic Eyes-  Sclera clear, conjunctiva pink Ears- hearing intact Oropharynx- clear Neck- supple, no JVP Lymph- no cervical lymphadenopathy Lungs- Clear to ausculation bilaterally, normal work of breathing Heart- Regular rate and rhythm, no murmurs, rubs or gallops, PMI not laterally displaced GI- soft, NT, ND, + BS Extremities- no clubbing, cyanosis, or edema MS- no significant deformity or  atrophy Skin- no rash or lesion Psych- euthymic mood, full affect Neuro- strength and sensation are intact  EKG-NSR at 76 bpm, normal EKG     Assessment and Plan: 1. Afib  Very paroxysmal, infrequent Converted in the ER with flecainide  300 mg x 1 Triggers discussed, avoid alcohol  Update echo I will  rx cardizem 30 mg to take with afib episodes 30- 60 mins prior to afib episodes, if that does not convert then take 300 mg flecainide x 1.   2. CHA2DS2VASc score of 1 No anticoagulation need per guidelines   Will see back in one year  Butch Penny C. Hoby Kawai, [facility] [address] [place], [place] [address] [phone]

## 2020-04-02 ENCOUNTER — Other Ambulatory Visit: Payer: Self-pay | Admitting: Family Medicine

## 2020-04-02 MED FILL — OMEPRAZOLE DR 40 MG CAPSULE: 40 | 30 days supply | Qty: 30 | Fill #2

## 2020-04-02 MED FILL — valACYclovir HCL 1 GM TABS: 1 | 7 days supply | Qty: 30 | Fill #2

## 2020-04-05 ENCOUNTER — Ambulatory Visit (HOSPITAL_COMMUNITY): Payer: Self-pay

## 2020-04-07 ENCOUNTER — Other Ambulatory Visit: Payer: Self-pay

## 2020-04-07 ENCOUNTER — Ambulatory Visit (HOSPITAL_COMMUNITY)
Admission: RE | Admit: 2020-04-07 | Discharge: 2020-04-07 | Disposition: A | Discharge status: Home / Self Care | Payer: Medicaid Other | Source: Ambulatory Visit | Attending: Nurse Practitioner | Admitting: Nurse Practitioner

## 2020-04-07 DIAGNOSIS — I480 Paroxysmal atrial fibrillation: Secondary | ICD-10-CM | POA: Diagnosis present

## 2020-04-07 DIAGNOSIS — E669 Obesity, unspecified: Secondary | ICD-10-CM | POA: Insufficient documentation

## 2020-04-07 NOTE — Progress Notes (Signed)
  Echocardiogram 2D Echocardiogram has been performed.  Pieter Partridge 04/07/2020, 8:51 AM

## 2020-04-09 ENCOUNTER — Encounter (HOSPITAL_COMMUNITY): Payer: Self-pay | Admitting: *Deleted

## 2020-04-19 ENCOUNTER — Ambulatory Visit: Payer: Self-pay

## 2020-05-11 ENCOUNTER — Emergency Department (HOSPITAL_COMMUNITY)
Admission: EM | Admit: 2020-05-11 | Discharge: 2020-05-12 | Disposition: A | Discharge status: Home / Self Care | Payer: Medicaid Other | Attending: Emergency Medicine | Admitting: Emergency Medicine

## 2020-05-11 ENCOUNTER — Emergency Department (HOSPITAL_COMMUNITY): Payer: Medicaid Other

## 2020-05-11 ENCOUNTER — Encounter (HOSPITAL_COMMUNITY): Payer: Self-pay | Admitting: Emergency Medicine

## 2020-05-11 DIAGNOSIS — R0789 Other chest pain: Secondary | ICD-10-CM | POA: Insufficient documentation

## 2020-05-11 DIAGNOSIS — R110 Nausea: Secondary | ICD-10-CM | POA: Diagnosis not present

## 2020-05-11 DIAGNOSIS — I4891 Unspecified atrial fibrillation: Secondary | ICD-10-CM | POA: Diagnosis not present

## 2020-05-11 DIAGNOSIS — Z5321 Procedure and treatment not carried out due to patient leaving prior to being seen by health care provider: Secondary | ICD-10-CM | POA: Diagnosis not present

## 2020-05-11 LAB — CBC
HCT: 39.2 % (ref 36.0–46.0)
Hemoglobin: 12.6 g/dL (ref 12.0–15.0)
MCH: 29.5 pg (ref 26.0–34.0)
MCHC: 32.1 g/dL (ref 30.0–36.0)
MCV: 91.8 fL (ref 80.0–100.0)
Platelets: 415 10*3/uL — ABNORMAL HIGH (ref 150–400)
RBC: 4.27 MIL/uL (ref 3.87–5.11)
RDW: 14.4 % (ref 11.5–15.5)
WBC: 10.5 10*3/uL (ref 4.0–10.5)
nRBC: 0 % (ref 0.0–0.2)

## 2020-05-11 MED ORDER — SODIUM CHLORIDE 0.9% FLUSH: 3.0000 mL | Freq: Once | INTRAVENOUS | Status: DC

## 2020-05-11 NOTE — ED Triage Notes (Signed)
Patient in POV, reports CP onset PTA. Central, non radiating. Patient noted to be in afib upon arrival to ED. Patient states history of same. Also endorses nausea.

## 2020-05-12 ENCOUNTER — Encounter (HOSPITAL_COMMUNITY): Payer: Self-pay | Admitting: Nurse Practitioner

## 2020-05-12 ENCOUNTER — Other Ambulatory Visit: Payer: Self-pay

## 2020-05-12 ENCOUNTER — Telehealth (HOSPITAL_COMMUNITY): Payer: Self-pay

## 2020-05-12 ENCOUNTER — Ambulatory Visit (HOSPITAL_BASED_OUTPATIENT_CLINIC_OR_DEPARTMENT_OTHER)
Admission: RE | Admit: 2020-05-12 | Discharge: 2020-05-12 | Disposition: A | Discharge status: Home / Self Care | Payer: Medicaid Other | Source: Ambulatory Visit | Attending: Nurse Practitioner | Admitting: Nurse Practitioner

## 2020-05-12 VITALS — BP 134/80 | HR 81 | Ht 66.0 in | Wt 202.8 lb

## 2020-05-12 DIAGNOSIS — I4891 Unspecified atrial fibrillation: Secondary | ICD-10-CM | POA: Diagnosis not present

## 2020-05-12 DIAGNOSIS — R110 Nausea: Secondary | ICD-10-CM | POA: Diagnosis not present

## 2020-05-12 DIAGNOSIS — I480 Paroxysmal atrial fibrillation: Secondary | ICD-10-CM

## 2020-05-12 DIAGNOSIS — Z5321 Procedure and treatment not carried out due to patient leaving prior to being seen by health care provider: Secondary | ICD-10-CM | POA: Diagnosis not present

## 2020-05-12 DIAGNOSIS — R0789 Other chest pain: Secondary | ICD-10-CM | POA: Diagnosis not present

## 2020-05-12 LAB — BASIC METABOLIC PANEL
Anion gap: 10 (ref 5–15)
BUN: 12 mg/dL (ref 6–20)
CO2: 26 mmol/L (ref 22–32)
Calcium: 9.6 mg/dL (ref 8.9–10.3)
Chloride: 106 mmol/L (ref 98–111)
Creatinine, Ser: 0.69 mg/dL (ref 0.44–1.00)
GFR calc Af Amer: >60 mL/min (ref >60)
GFR calc non Af Amer: >60 mL/min (ref >60)
Glucose, Bld: 103 mg/dL — ABNORMAL HIGH (ref 70–99)
Potassium: 4.6 mmol/L (ref 3.5–5.1)
Sodium: 142 mmol/L (ref 135–145)

## 2020-05-12 LAB — TROPONIN I (HIGH SENSITIVITY): Troponin I (High Sensitivity): <2 ng/L (ref <18)

## 2020-05-12 LAB — HCG, QUANTITATIVE, PREGNANCY: hCG, Beta Chain, Quant, S: 4 m[IU]/mL (ref <5)

## 2020-05-12 NOTE — Progress Notes (Addendum)
Primary Care Physician: Scot Jun, FNP Referring Physician: ER f/u    Melanie Vincent is a 44 y.o. female with a h/o paroxysmal afib that is in the afib cliinc for f/u after being seen in the ER on Monday for afib. She developed a h/a laid down to rest, got up, had N/V and went  into afib. She  had some alcohol earlier in the day and this has been a trigger in the past. She  has used PIP flecainide in the past but it had been so long since using the RX, that the pills had crumbled.  She  converted with 300 mg flecainide in the ER yesterday.States that her mother had early onset of afib. Mom has MVP. Ekg today shows SR. CHA2DS2VASc score is 1. No anticoagulation is needed by guidelines.   F/u in afib clinic, 6/5. Pt went into afib last pm after she ate a seafood platter and drank a glass of wine. She  later vomited and she feels this triggered afib.  She  has 300 mg flecainide to use pill in pocket for afib episodes but she was afraid to use it. She  went to the ER but it was so busy, she came back home. She has used a few of her 30 mg Cardizem tabs over the night but remains in rate controlled afib. She is only symptomatic with walking.  CHA2DS2VASc score is 1. No anticoagulation is needed by guidelines.    Today, she denies symptoms of palpitations, chest pain, shortness of breath, orthopnea, PND, lower extremity edema, dizziness, presyncope, syncope, or neurologic sequela. The patient is tolerating medications without difficulties and is otherwise without complaint today.   Past Medical History:  Diagnosis Date  . Anxiety   . Breast lump    Mammogram (last 2013)  . DDD (degenerative disc disease), lumbar   . Dysrhythmia 11/25/2014   NEW ONSET ATRIAL FIBRILATION  . GERD (gastroesophageal reflux disease)   . Herpes simplex labialis   . Palpitations 06/14/2012   Past Surgical History:  Procedure Laterality Date  . CESAREAN SECTION    . TUBAL LIGATION     2008    Current  Outpatient Medications  Medication Sig Dispense Refill  . diltiazem (CARDIZEM) 30 MG tablet Take 1 tablet every 4 hours AS NEEDED for heart rate >100 as long as blood pressure >100. 45 tablet 1  . flecainide (TAMBOCOR) 100 MG tablet Take 300mg  by mouth once as needed at onset of afib can repeat every 4 days only 9 tablet 0  . omeprazole (PRILOSEC) 40 MG capsule Take 1 capsule (40 mg total) by mouth daily. (Patient taking differently: Take 40 mg by mouth as needed (for heartburn). ) 30 capsule 3  . ondansetron (ZOFRAN ODT) 4 MG disintegrating tablet Take 1 tablet (4 mg total) by mouth every 8 (eight) hours as needed for nausea or vomiting. 6 tablet 0  . valACYclovir (VALTREX) 1000 MG tablet Take 1,000 mg by mouth 2 (two) times daily.     No current facility-administered medications for this encounter.    Allergies  Allergen Reactions  . Tramadol Other (See Comments)    Pt states that this medication caused insomnia.    Social History   Socioeconomic History  . Marital status: Single    Spouse name: Not on file  . Number of children: 4  . Years of education: Not on file  . Highest education level: Not on file  Occupational History  . Occupation: Building control surveyor  Tobacco Use  . Smoking status: Former Smoker    Quit date: 06/15/2007    Years since quitting: 12.9  . Smokeless tobacco: Never Used  Substance and Sexual Activity  . Alcohol use: Yes    Alcohol/week: 4.0 standard drinks    Types: 2 Glasses of wine, 2 Standard drinks or equivalent per week  . Drug use: Yes    Types: Marijuana    Comment: "quit months ago"  . Sexual activity: Yes    Birth control/protection: Surgical, Condom  Other Topics Concern  . Not on file  Social History Narrative   Lives at home with four children. (11/2012: Twins age 42, 36 year old and 44 year old. 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          Social Determinants of Health   Financial Resource Strain:   .  Difficulty of Paying Living Expenses:   Food Insecurity:   . Worried About Programme researcher, broadcasting/film/video in the Last Year:   . Barista in the Last Year:   Transportation Needs:   . Freight forwarder (Medical):   Marland Kitchen Lack of Transportation (Non-Medical):   Physical Activity:   . Days of Exercise per Week:   . Minutes of Exercise per Session:   Stress:   . Feeling of Stress :   Social Connections:   . Frequency of Communication with Friends and Family:   . Frequency of Social Gatherings with Friends and Family:   . Attends Religious Services:   . Active Member of Clubs or Organizations:   . Attends Banker Meetings:   Marland Kitchen Marital Status:   Intimate Partner Violence:   . Fear of Current or Ex-Partner:   . Emotionally Abused:   Marland Kitchen Physically Abused:   . Sexually Abused:     Family History  Problem Relation Age of Onset  . Diabetes Father   . Hypertension Father   . Stroke Father   . Colon polyps Father   . Diabetes Paternal Grandmother   . Heart disease Paternal Grandmother   . Diabetes Paternal Grandfather   . Heart disease Paternal Grandfather   . Colon cancer Paternal Aunt        late 59's  . Esophageal cancer Neg Hx   . Rectal cancer Neg Hx   . Stomach cancer Neg Hx     ROS- All systems are reviewed and negative except as per the HPI above  Physical Exam: Vitals:   05/12/20 0930  BP: 134/80  Pulse: 81  Weight: 92 kg  Height: 5\' 6"  (1.676 m)   Wt Readings from Last 3 Encounters:  05/12/20 92 kg  05/11/20 94.3 kg  03/31/20 94.3 kg    Labs: Lab Results  Component Value Date   NA 142 05/11/2020   K 4.6 05/11/2020   CL 106 05/11/2020   CO2 26 05/11/2020   GLUCOSE 103 (H) 05/11/2020   BUN 12 05/11/2020   CREATININE 0.69 05/11/2020   CALCIUM 9.6 05/11/2020   MG 2.1 04/01/2018   Lab Results  Component Value Date   INR 1.02 09/15/2015   Lab Results  Component Value Date   CHOL 216 (H) 02/12/2019   HDL 57 02/12/2019   LDLCALC 144 (H)  02/12/2019   TRIG 73 02/12/2019     GEN- The patient is well appearing, alert and oriented x 3 today.   Head- normocephalic, atraumatic Eyes-  Sclera clear, conjunctiva pink  Ears- hearing intact Oropharynx- clear Neck- supple, no JVP Lymph- no cervical lymphadenopathy Lungs- Clear to ausculation bilaterally, normal work of breathing Heart- irregular rate and rhythm, no murmurs, rubs or gallops, PMI not laterally displaced GI- soft, NT, ND, + BS Extremities- no clubbing, cyanosis, or edema MS- no significant deformity or atrophy Skin- no rash or lesion Psych- euthymic mood, full affect Neuro- strength and sensation are intact  EKG-afib at 81 bpm, qrs int 66 ms, qrs int 66 ms, qtc 401 ms Echo-IMPRESSIONS    1. Left ventricular ejection fraction, by estimation, is 60 to 65%. The  left ventricle has normal function. The left ventricle has no regional  wall motion abnormalities. Left ventricular diastolic parameters were  normal.  2. Right ventricular systolic function is normal. The right ventricular  size is normal. There is normal pulmonary artery systolic pressure.  3. The mitral valve is abnormal. Trivial mitral valve regurgitation.  4. The aortic valve is tricuspid. Aortic valve regurgitation is not  visualized.  5. The inferior vena cava is normal in size with greater than 50%  respiratory variability, suggesting right atrial pressure of 3 mmHg.    Assessment and Plan: 1. Afib  Episode in April where she was converted with 300 mg flecainide and did well with this She was given 300 mg flecainide to use pill in pocket in the ER Pt was afraid to take at home as she could not get her BP cuff to work She has her flecainide with her and I offered for her to take 300 mg here and she could wait in our lobby and would pull back at intervals to monitor her Her kids are at home so she wants to go home take 30 mg cardizem and then 150 mg flecainide, and go to bed as she slept  poorly last night  She  will call us back at 3 pm today with her HR and BP, new BP cuff was supplied to pt  Triggers discussed, avoid alcohol   2. CHA2DS2VASc score of 1 No anticoagulation need per guidelines   Will  reassess this pm  Addendum-6/3-pt reported that when she got home, she took 30 mg cardizem and flecainide 150 mg and converted in an hour. She was feeling improved that afternoon  Lupita Leash [facility] [address] [place], [place] [address] [phone]

## 2020-05-12 NOTE — Telephone Encounter (Signed)
Patient called and states she is feeling heaviness in her chest, no fever, some vomiting and diarrhea last night which she thinks may have been because she ate seafood- Oysters and Scallops.  She went to the ER last night and the wait was too long so she left and went back home.Patient states she has been taking the Cardizem 30mg  every 4 hours as needed.  Her heart is racing and she is very nervous. She tried to check her blood pressure and pulse and her cuff is not registering at all. Advised patient to come in for appointment at 9:30am. Patient verbalized understanding.

## 2020-05-12 NOTE — ED Notes (Signed)
Called pt to be roomed, no response x2

## 2020-05-12 NOTE — Patient Instructions (Addendum)
*  Take Cardizem 30mg  once she is home *Take Flecanide 150mg  once she is home  *Call at 3 pm

## 2020-05-13 NOTE — Addendum Note (Signed)
Encounter addended by: Newman Nip, NP on: 05/13/2020 4:48 PM  Actions taken: Clinical Note Signed

## 2020-05-17 ENCOUNTER — Emergency Department (HOSPITAL_COMMUNITY): Payer: Medicaid Other

## 2020-05-17 ENCOUNTER — Other Ambulatory Visit: Payer: Self-pay

## 2020-05-17 ENCOUNTER — Encounter (HOSPITAL_COMMUNITY): Payer: Self-pay | Admitting: Emergency Medicine

## 2020-05-17 ENCOUNTER — Emergency Department (HOSPITAL_COMMUNITY)
Admission: EM | Admit: 2020-05-17 | Discharge: 2020-05-17 | Disposition: A | Discharge status: Home / Self Care | Payer: Medicaid Other | Attending: Emergency Medicine | Admitting: Emergency Medicine

## 2020-05-17 DIAGNOSIS — I4891 Unspecified atrial fibrillation: Secondary | ICD-10-CM | POA: Diagnosis not present

## 2020-05-17 DIAGNOSIS — R0789 Other chest pain: Secondary | ICD-10-CM | POA: Diagnosis present

## 2020-05-17 DIAGNOSIS — R079 Chest pain, unspecified: Secondary | ICD-10-CM

## 2020-05-17 DIAGNOSIS — Z7982 Long term (current) use of aspirin: Secondary | ICD-10-CM | POA: Diagnosis not present

## 2020-05-17 LAB — CBC
HCT: 38.8 % (ref 36.0–46.0)
Hemoglobin: 12.4 g/dL (ref 12.0–15.0)
MCH: 29.8 pg (ref 26.0–34.0)
MCHC: 32 g/dL (ref 30.0–36.0)
MCV: 93.3 fL (ref 80.0–100.0)
Platelets: 360 10*3/uL (ref 150–400)
RBC: 4.16 MIL/uL (ref 3.87–5.11)
RDW: 14.1 % (ref 11.5–15.5)
WBC: 6.2 10*3/uL (ref 4.0–10.5)
nRBC: 0 % (ref 0.0–0.2)

## 2020-05-17 LAB — TROPONIN I (HIGH SENSITIVITY)
Troponin I (High Sensitivity): 3 ng/L (ref <18)
Troponin I (High Sensitivity): 3 ng/L (ref <18)

## 2020-05-17 LAB — BASIC METABOLIC PANEL
Anion gap: 9 (ref 5–15)
BUN: 9 mg/dL (ref 6–20)
CO2: 23 mmol/L (ref 22–32)
Calcium: 9.3 mg/dL (ref 8.9–10.3)
Chloride: 108 mmol/L (ref 98–111)
Creatinine, Ser: 0.67 mg/dL (ref 0.44–1.00)
GFR calc Af Amer: >60 mL/min (ref >60)
GFR calc non Af Amer: >60 mL/min (ref >60)
Glucose, Bld: 89 mg/dL (ref 70–99)
Potassium: 4.1 mmol/L (ref 3.5–5.1)
Sodium: 140 mmol/L (ref 135–145)

## 2020-05-17 MED ORDER — SODIUM CHLORIDE 0.9% FLUSH
3.0000 mL | Freq: Once | INTRAVENOUS | Status: AC
  Administered 2020-05-17: 3 mL via INTRAVENOUS

## 2020-05-17 NOTE — ED Triage Notes (Signed)
Patient arrives to ED with complaints of left sided chest pain with radiation of her pain to her neck. Patient states she was in [place] all week and flew back yesterday. Patient received 324 ASA from EMS and is CP free on arrival.

## 2020-05-17 NOTE — ED Notes (Signed)
Pt verbalized understanding of discharge instructions, discharged from ED in NAD  °

## 2020-05-17 NOTE — Discharge Instructions (Addendum)
Please read instructions below. °Follow up with your primary care provider this week. ° Return to the ER for new or worsening symptoms; including worsening chest pain, shortness of breath, pain that radiates to the arm or neck, pain or shortness of breath worsened with exertion.  ° °

## 2020-05-17 NOTE — ED Provider Notes (Addendum)
[facility] EMERGENCY DEPARTMENT Provider Note   CSN: [id number] Arrival date & time: 05/17/20  0920     History Chief Complaint  Patient presents with  . Chest Pain    Melanie Vincent is a 44 y.o. female medical history of paroxysmal A. fib, mitral valve prolapse, presenting to the emergency department with complaint of chest to arrival.  She states she is working outside in her yard and just moved a [employer]  She states she went inside and about 10 minutes after moving "she felt left-sided chest pain described as a "twinge" of pain.  During that time she checked her blood pressure and noted it to be around 190 systolic.  She states that her feel anxious and she began having a feeling of a lump in her throat.  She took 324 of aspirin.  On EMS arrival she states her blood pressure had returned to normal and her chest pain has resolved upon arrival to the ED.  She states she has not felt any symptoms of A. fib, and it is very apparent to her when she is in A. fib.  She has not required anticoagulation.  She denies associated shortness of breath, diaphoresis, nausea or vomiting.  No history of PE/DVT.  She did recently travel to [place], however the longest she was immobile for was 2 hours.  No history of CAD.  The history is provided by the patient and medical records.    HPI: A 44 year old patient with a history of obesity presents for evaluation of chest pain. Initial onset of pain was approximately 1-3 hours ago. The patient's chest pain is not worse with exertion. The patient's chest pain is middle- or left-sided, is not well-localized, is not described as heaviness/pressure/tightness, is not sharp and does radiate to the arms/jaw/neck. The patient does not complain of nausea and denies diaphoresis. The patient has no history of stroke, has no history of peripheral artery disease, has not smoked in the past 90 days, denies any history of treated diabetes, has no relevant  family history of coronary artery disease (first degree relative at less than age 81), is not hypertensive and has no history of hypercholesterolemia.   Past Medical History:  Diagnosis Date  . Anxiety   . Breast lump    Mammogram (last 2013)  . DDD (degenerative disc disease), lumbar   . Dysrhythmia 11/25/2014   NEW ONSET ATRIAL FIBRILATION  . GERD (gastroesophageal reflux disease)   . Herpes simplex labialis   . Palpitations 06/14/2012    Patient Active Problem List   Diagnosis Date Noted  . Sleep apnea suspected 10/06/2015  . Anxiety reaction 09/15/2015  . PAF (paroxysmal atrial fibrillation) (HCC) 11/25/2014  . Herpes labialis 07/29/2013  . Well woman exam (no gynecological exam) 05/02/2013  . GERD (gastroesophageal reflux disease) 11/13/2012  . Overweight 06/14/2012    Past Surgical History:  Procedure Laterality Date  . CESAREAN SECTION    . TUBAL LIGATION     2008     OB History    Gravida  6   Para  4   Term  4   Preterm      AB  2   Living  5     SAB      TAB  2   Ectopic      Multiple  1   Live Births           Obstetric Comments  Twins c-section in 2008  Family History  Problem Relation Age of Onset  . Diabetes Father   . Hypertension Father   . Stroke Father   . Colon polyps Father   . Diabetes Paternal Grandmother   . Heart disease Paternal Grandmother   . Diabetes Paternal Grandfather   . Heart disease Paternal Grandfather   . Colon cancer Paternal Aunt        late 18's  . Esophageal cancer Neg Hx   . Rectal cancer Neg Hx   . Stomach cancer Neg Hx     Social History   Tobacco Use  . Smoking status: Former Smoker    Quit date: 06/15/2007    Years since quitting: 12.9  . Smokeless tobacco: Never Used  Substance Use Topics  . Alcohol use: Yes    Alcohol/week: 4.0 standard drinks    Types: 2 Glasses of wine, 2 Standard drinks or equivalent per week  . Drug use: Yes    Types: Marijuana    Comment: "quit months  ago"    Home Medications Prior to Admission medications   Medication Sig Start Date End Date Taking? Authorizing Provider  aspirin 81 MG chewable tablet Chew 324 mg by mouth once.   Yes [provider]  diltiazem (CARDIZEM) 30 MG tablet Take 1 tablet every 4 hours AS NEEDED for heart rate >100 as long as blood pressure >100. Patient taking differently: Take 30 mg by mouth every 4 (four) hours as needed (for heart rate>100 and as long as blood pressure is>100). Take 1 tablet every 4 hours AS NEEDED for heart rate >100 as long as blood pressure >100. 03/31/20  Yes Newman Nip, NP  flecainide (TAMBOCOR) 100 MG tablet Take 300mg  by mouth once as needed at onset of afib can repeat every 4 days only Patient taking differently: Take 150 mg by mouth See admin instructions. once as needed at onset of afib can repeat every 4 days only 03/31/20  Yes 04/02/20, NP  omeprazole (PRILOSEC) 40 MG capsule Take 1 capsule (40 mg total) by mouth daily. Patient taking differently: Take 40 mg by mouth daily as needed (for heartburn).  04/09/19  Yes 04/11/19, FNP  ondansetron (ZOFRAN ODT) 4 MG disintegrating tablet Take 1 tablet (4 mg total) by mouth every 8 (eight) hours as needed for nausea or vomiting. Patient taking differently: Take 4 mg by mouth daily as needed for nausea or vomiting.  03/29/20  Yes Little, 03/31/20, MD  valACYclovir (VALTREX) 1000 MG tablet Take 1,000 mg by mouth 2 (two) times daily as needed (for cold sores).    Yes [provider]    Allergies    Tramadol  Review of Systems   Review of Systems  All other systems reviewed and are negative.   Physical Exam Updated Vital Signs BP 118/86   Pulse 62   Temp 97.6 F (36.4 C) (Oral)   Resp 18   SpO2 99%   Physical Exam Vitals and nursing note reviewed.  Constitutional:      General: She is not in acute distress.    Appearance: She is well-developed. She is not diaphoretic.  HENT:     Head:  Normocephalic and atraumatic.  Eyes:     Conjunctiva/sclera: Conjunctivae normal.  Cardiovascular:     Rate and Rhythm: Normal rate and regular rhythm.     Pulses: Normal pulses.     Heart sounds: Normal heart sounds.  Pulmonary:     Effort: Pulmonary effort is normal.  No respiratory distress.     Breath sounds: Normal breath sounds.  Chest:     Chest wall: No tenderness.  Abdominal:     General: Bowel sounds are normal.     Palpations: Abdomen is soft.     Tenderness: There is no abdominal tenderness. There is no guarding or rebound.  Musculoskeletal:     Right lower leg: No edema.     Left lower leg: No edema.  Skin:    General: Skin is warm.  Neurological:     Mental Status: She is alert.  Psychiatric:        Behavior: Behavior normal.     ED Results / Procedures / Treatments   Labs (all labs ordered are listed, but only abnormal results are displayed) Labs Reviewed  BASIC METABOLIC PANEL  CBC  TROPONIN I (HIGH SENSITIVITY)  TROPONIN I (HIGH SENSITIVITY)    EKG EKG Interpretation  Date/Time:  Monday May 17 2020 09:19:58 EDT Ventricular Rate:  76 PR Interval:  156 QRS Duration: 70 QT Interval:  372 QTC Calculation: 418 R Axis:   68 Text Interpretation: Normal sinus rhythm Normal ECG Confirmed by Veryl Speak [phone]) on 05/17/2020 4:19:40 PM   Radiology DG Chest 2 View  Result Date: 05/17/2020 CLINICAL DATA:  44 year old female with history of chest pain since 9 a.m. this morning. EXAM: CHEST - 2 VIEW COMPARISON:  Chest x-ray 05/11/2020. FINDINGS: Lung volumes are normal. No consolidative airspace disease. No pleural effusions. No pneumothorax. No pulmonary nodule or mass noted. Pulmonary vasculature and the cardiomediastinal silhouette are within normal limits. IMPRESSION: No radiographic evidence of acute cardiopulmonary disease. Electronically Signed   By: Vinnie Langton M.D.   On: 05/17/2020 10:10    Procedures Procedures (including critical care  time)  Medications Ordered in ED Medications  sodium chloride flush (NS) 0.9 % injection 3 mL (3 mLs Intravenous Given 05/17/20 1217)    ED Course  I have reviewed the triage vital signs and the nursing notes.  Pertinent labs & imaging results that were available during my care of the patient were reviewed by me and considered in my medical decision making (see chart for details).    MDM Rules/Calculators/A&P HEAR Score: 2                    Pt presenting with left sided chest pain after working outside with some heavy lifting. Chest pain is not likely of cardiac or pulmonary etiology d/t presentation, VSS, no tracheal deviation, no JVD or new murmur, RRR, breath sounds equal bilaterally, EKG without acute abnormalities, negative troponin x2, and negative CXR. Low HEAR score of 2. Patient is to be discharged with recommendation to follow up with PCP in regards to today's hospital visit. Pt has been advised to return to the ED if CP becomes exertional, associated with diaphoresis or nausea, radiates to left jaw/arm, worsens or becomes concerning in any way. Pt appears reliable for follow up and is agreeable to discharge.   Final Clinical Impression(s) / ED Diagnoses Final diagnoses:  Left-sided chest pain    Rx / DC Orders ED Discharge Orders    None       Stevana Dufner, [place] N, PA-C 05/17/20 1620    Gaylon Bentz, [place] N, PA-C 05/17/20 1629    Veryl Speak, MD 05/18/20 [phone]

## 2020-06-07 ENCOUNTER — Ambulatory Visit: Payer: Medicaid Other | Admitting: Podiatry

## 2020-06-13 ENCOUNTER — Encounter: Payer: Self-pay | Admitting: Cardiology

## 2020-06-13 DIAGNOSIS — R072 Precordial pain: Secondary | ICD-10-CM | POA: Insufficient documentation

## 2020-06-13 DIAGNOSIS — Z7189 Other specified counseling: Secondary | ICD-10-CM | POA: Insufficient documentation

## 2020-06-13 NOTE — Progress Notes (Signed)
Cardiology Office Note   Date:  06/15/2020   ID:  DERISHA FUNDERBURKE, DOB 19-Jan-1976, MRN [id number]  PCP:  Patient, No Pcp Per  Cardiologist:   No primary care provider on file.   Chief Complaint  Patient presents with  . Chest Pain      History of Present Illness: BLIMI GODBY is a 44 y.o. female who presents for followup of atrial fib.  I saw her for this in the past.  She was in the ED earlier this year for this.  She had a trigger recently of EtOH but was treated with pill in pocket fib.  She did not have the pills this time.   She took 150 mg flecainide at home after this visit and converted.  She was in the ED with chest pain last month.  I reviewed these records for this visit.  This was thought to be musculoskeletal.     She says that the episode of discomfort happened after moving a boat and had some water in it.  I did review these records for this visit.  There was no objective evidence of ischemia.  Since then she been quite active.  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  She is otherwise not had any shortness of breath, PND or orthopnea.  She had no palpitations, presyncope or syncope other than the episodes of atrial fibrillation when she had palpitations.  She knows when she is in that rhythm.  She has been going to the gym was one of her 68 year olds.  Past Medical History:  Diagnosis Date  . Anxiety   . Breast lump    Mammogram (last 2013)  . DDD (degenerative disc disease), lumbar   . GERD (gastroesophageal reflux disease)   . Herpes simplex labialis   . PAF (paroxysmal atrial fibrillation) (HCC)     Past Surgical History:  Procedure Laterality Date  . CESAREAN SECTION    . TUBAL LIGATION     2008     Current Outpatient Medications  Medication Sig Dispense Refill  . diltiazem (CARDIZEM) 30 MG tablet Take 1 tablet every 4 hours AS NEEDED for heart  rate >100 as long as blood pressure >100. (Patient not taking: Reported on 06/15/2020) 45 tablet 1  . flecainide (TAMBOCOR) 100 MG tablet Take 300mg  by mouth once as needed at onset of afib can repeat every 4 days only (Patient not taking: Reported on 06/15/2020) 9 tablet 0  . omeprazole (PRILOSEC) 40 MG capsule Take 1 capsule (40 mg total) by mouth daily. (Patient not taking: Reported on 06/15/2020) 30 capsule 3  . ondansetron (ZOFRAN ODT) 4 MG disintegrating tablet Take 1 tablet (4 mg total) by mouth every 8 (eight) hours as needed for nausea or vomiting. (Patient not taking: Reported on 06/15/2020) 6 tablet 0  . valACYclovir (VALTREX) 1000 MG tablet Take 1,000 mg by mouth 2 (two) times daily as needed (for cold sores).  (Patient not taking: Reported on 06/15/2020)     No current facility-administered medications for this visit.    Allergies:   Tramadol    ROS:  Please see the history of present illness.   Otherwise, review of systems are positive for none.   All other systems are reviewed and negative.    PHYSICAL EXAM: VS:  BP 104/62   Pulse 81   Ht 5' 6.5" (1.689 m)  Wt 200 lb (90.7 kg)   SpO2 98%   BMI 31.80 kg/m  , BMI Body mass index is 31.8 kg/m. GENERAL:  Well appearing HEENT:  Pupils equal round and reactive, fundi not visualized, oral mucosa unremarkable NECK:  No jugular venous distention, waveform within normal limits, carotid upstroke brisk and symmetric, no bruits, no thyromegaly LYMPHATICS:  No cervical, inguinal adenopathy LUNGS:  Clear to auscultation bilaterally BACK:  No CVA tenderness CHEST:  Unremarkable HEART:  PMI not displaced or sustained,S1 and S2 within normal limits, no S3, no S4, no clicks, no rubs, no murmurs ABD:  Flat, positive bowel sounds normal in frequency in pitch, no bruits, no rebound, no guarding, no midline pulsatile mass, no hepatomegaly, no splenomegaly EXT:  2 plus pulses throughout, no edema, no cyanosis no clubbing SKIN:  No rashes no  nodules NEURO:  Cranial nerves II through XII grossly intact, motor grossly intact throughout PSYCH:  Cognitively intact, oriented to person place and time    EKG:  EKG is ordered today. The ekg ordered today demonstrates sinus rhythm, rate 81, axis within normal limits, intervals within normal limits, no acute ST-T wave changes.   Recent Labs: 03/29/2020: ALT 24 05/17/2020: BUN 9; Creatinine, Ser 0.67; Hemoglobin 12.4; Platelets 360; Potassium 4.1; Sodium 140    Lipid Panel    Component Value Date/Time   CHOL 216 (H) 02/12/2019 1458   TRIG 73 02/12/2019 1458   HDL 57 02/12/2019 1458   CHOLHDL 3.8 02/12/2019 1458   CHOLHDL 3.7 Ratio 06/01/2009 2050   VLDL 12 06/01/2009 2050   LDLCALC 144 (H) 02/12/2019 1458      Wt Readings from Last 3 Encounters:  06/15/20 200 lb (90.7 kg)  05/12/20 202 lb 12.8 oz (92 kg)  05/11/20 207 lb 14.3 oz (94.3 kg)      Other studies Reviewed: Additional studies/ records that were reviewed today include: ED record. Review of the above records demonstrates:  Please see elsewhere in the note.     ASSESSMENT AND PLAN:  ATRIAL FIB:  Ms. LEONOR DARNELL has a CHA2DS2 - VASc score of 1.  We talked about avoiding triggers.  I think she would still do the pill in pocket approach using flecainide and Cardizem as this is infrequent.  No indication for anticoagulation.  I do not see a recent TSH but other labs were unremarkable.  She has clear triggers for why she would have the fibrillation.  She has no symptoms consistent with hyperthyroidism or hypothyroidism.  No labs will be drawn today.  CHEST PAIN: I talked to her about this and reviewed the hospital records.  I think the pretest probability of obstructive coronary disease as an etiology is very low and no further testing is indicated.  COVID EDUCATION: She has had her vaccine.  Current medicines are reviewed at length with the patient today.  The patient does not have concerns regarding  medicines.  The following changes have been made:  no change  Labs/ tests ordered today include: None  Orders Placed This Encounter  Procedures  . EKG 12-Lead     Disposition:   FU with Atrial Fib Clinic in six months.     Signed, Rollene Rotunda, MD  06/15/2020 2:35 PM    [facility]

## 2020-06-15 ENCOUNTER — Other Ambulatory Visit: Payer: Self-pay

## 2020-06-15 ENCOUNTER — Encounter: Payer: Self-pay | Admitting: Cardiology

## 2020-06-15 ENCOUNTER — Ambulatory Visit (INDEPENDENT_AMBULATORY_CARE_PROVIDER_SITE_OTHER): Payer: Medicaid Other | Admitting: Cardiology

## 2020-06-15 VITALS — BP 104/62 | HR 81 | Ht 66.5 in | Wt 200.0 lb

## 2020-06-15 DIAGNOSIS — R072 Precordial pain: Secondary | ICD-10-CM | POA: Diagnosis not present

## 2020-06-15 DIAGNOSIS — I480 Paroxysmal atrial fibrillation: Secondary | ICD-10-CM

## 2020-06-15 DIAGNOSIS — Z7189 Other specified counseling: Secondary | ICD-10-CM | POA: Diagnosis not present

## 2020-06-15 NOTE — Patient Instructions (Signed)
Medication Instructions:  Your physician recommends that you continue on your current medications as directed. Please refer to the Current Medication list given to you today.  *If you need a refill on your cardiac medications before your next appointment, please call your pharmacy*    Follow-Up: At [facility], you and your health needs are our priority.  As part of our continuing mission to provide you with exceptional heart care, we have created designated Provider Care Teams.  These Care Teams include your primary Cardiologist (physician) and Advanced Practice Providers (APPs -  Physician Assistants and Nurse Practitioners) who all work together to provide you with the care you need, when you need it.  We recommend signing up for the patient portal called "MyChart".  Sign up information is provided on this After Visit Summary.  MyChart is used to connect with patients for Virtual Visits (Telemedicine).  Patients are able to view lab/test results, encounter notes, upcoming appointments, etc.  Non-urgent messages can be sent to your provider as well.   To learn more about what you can do with MyChart, go to [email or web address].    Your next appointment:   6 month(s)  The format for your next appointment:   In Person  Provider:   Rudi Coco NP in the AFib Clinic     Other Instructions Follow up as needed with Dr. Antoine Poche, or as directed by Lupita Leash NP

## 2020-06-17 ENCOUNTER — Encounter: Payer: Medicaid Other | Admitting: Internal Medicine

## 2020-06-22 ENCOUNTER — Ambulatory Visit: Payer: Medicaid Other | Admitting: Podiatry

## 2020-06-22 DIAGNOSIS — L84 Corns and callosities: Secondary | ICD-10-CM

## 2020-06-22 DIAGNOSIS — M2042 Other hammer toe(s) (acquired), left foot: Secondary | ICD-10-CM

## 2020-06-22 DIAGNOSIS — M2041 Other hammer toe(s) (acquired), right foot: Secondary | ICD-10-CM

## 2020-06-22 DIAGNOSIS — L989 Disorder of the skin and subcutaneous tissue, unspecified: Secondary | ICD-10-CM | POA: Diagnosis not present

## 2020-06-22 NOTE — Progress Notes (Signed)
Subjective:   Patient ID: Melanie Vincent, female   DOB: 44 y.o.   MRN: [id number]   HPI 44 year old female presents the office today for concerns of calluses to both of her feet pointing to medial first metatarsal head to the right side worse than left as well as a corn right fifth toe.  The calluses on the first MPJ are new after she wore different pair of sandals and that came up suddenly shortly after this.  She states they were flat, cheap sandals.  She had a corn on the right fifth toe for some time.  Hurts with pressure and she states that it comes often comes right back.  Review of Systems  All other systems reviewed and are negative.   Past Medical History:  Diagnosis Date   Anxiety    Breast lump    Mammogram (last 2013)   DDD (degenerative disc disease), lumbar    GERD (gastroesophageal reflux disease)    Herpes simplex labialis    PAF (paroxysmal atrial fibrillation) (HCC)     Past Surgical History:  Procedure Laterality Date   CESAREAN SECTION     TUBAL LIGATION     2008     Current Outpatient Medications:    diltiazem (CARDIZEM) 30 MG tablet, Take 1 tablet every 4 hours AS NEEDED for heart rate >100 as long as blood pressure >100. (Patient not taking: Reported on 06/15/2020), Disp: 45 tablet, Rfl: 1   flecainide (TAMBOCOR) 100 MG tablet, Take 300mg  by mouth once as needed at onset of afib can repeat every 4 days only (Patient not taking: Reported on 06/15/2020), Disp: 9 tablet, Rfl: 0   omeprazole (PRILOSEC) 40 MG capsule, Take 1 capsule (40 mg total) by mouth daily. (Patient not taking: Reported on 06/15/2020), Disp: 30 capsule, Rfl: 3   valACYclovir (VALTREX) 1000 MG tablet, Take 1,000 mg by mouth 2 (two) times daily as needed (for cold sores).  (Patient not taking: Reported on 06/15/2020), Disp: , Rfl:   Allergies  Allergen Reactions   Tramadol Other (See Comments)    Pt states that this medication caused insomnia.       Objective:  Physical Exam   General: AAO x3, NAD  Dermatological: Hyperkeratotic lesions medial first MPJ bilaterally the right side worse than left.  Mild hyperkeratotic tissue right fifth PIPJ.  There is no underlying ulceration drainage or any signs of infection noted today.  No open lesions otherwise.  Vascular: Dorsalis Pedis artery and Posterior Tibial artery pedal pulses are 2/4 bilateral with immedate capillary fill time. There is no pain with calf compression, swelling, warmth, erythema.   Neruologic: Grossly intact via light touch bilateral.  Musculoskeletal: No gross boney pedal deformities bilateral. No pain, crepitus, or limitation noted with foot and ankle range of motion bilateral. Muscular strength 5/5 in all groups tested bilateral.  Gait: Unassisted, Nonantalgic.       Assessment:   44 year old female bilateral hyperkeratotic lesions     Plan:   -Treatment options discussed including all alternatives, risks, and complications -Etiology of symptoms were discussed -Lesion started after wearing new pairs of shoes.  Sharp debridement any complications or bleeding.  The right first MPJ 55 with alcohol pad was placed followed by a small amount of salicylic acid and a bandage.  Post procedure instructions discussed.  Monitoring signs or symptoms of infection.  Return if symptoms worsen or fail to improve.  Fredia Beets DPM

## 2020-06-22 NOTE — Patient Instructions (Signed)
If was nice to meet you today. If you have any questions or any further concerns, please feel fee to give me a call. You can call our office at [phone] or please feel fee to send me a message through MyChart.    Keep the bandage on for 24 hours. At that time, remove and clean with soap and water. If it hurts or burns before 24 hours go ahead and remove the bandage and wash with soap and water. Keep the area clean. If there is any blistering cover with antibiotic ointment and a bandage. Monitor for any redness, drainage, or other signs of infection. Call the office if any are to occur. If you have any questions, please call the office at [phone].  

## 2020-07-19 ENCOUNTER — Ambulatory Visit: Payer: Medicaid Other

## 2020-07-20 ENCOUNTER — Ambulatory Visit: Payer: Medicaid Other

## 2020-07-20 ENCOUNTER — Ambulatory Visit: Payer: Medicaid Other | Attending: Internal Medicine

## 2020-07-20 DIAGNOSIS — Z23 Encounter for immunization: Secondary | ICD-10-CM

## 2020-07-20 NOTE — Progress Notes (Signed)
   Covid-19 Vaccination Clinic  Name:  Melanie Vincent    MRN: [id number] DOB: 01-Jun-1976  07/20/2020  [name] was observed post Covid-19 immunization for 15 minutes without incident. She was provided with Vaccine Information Sheet and instruction to access the V-Safe system.   [name] was instructed to call 911 with any severe reactions post vaccine: Marland Kitchen Difficulty breathing  . Swelling of face and throat  . A fast heartbeat  . A bad rash all over body  . Dizziness and weakness   Immunizations Administered    Name Date Dose VIS Date Route   Pfizer COVID-19 Vaccine 07/20/2020  4:15 PM 0.3 mL 02/04/2019 Intramuscular   Manufacturer: [employer], [employer]   Lot: [id number]   NDC: [address]-8

## 2020-08-10 DIAGNOSIS — K219 Gastro-esophageal reflux disease without esophagitis: Secondary | ICD-10-CM | POA: Insufficient documentation

## 2020-08-12 ENCOUNTER — Other Ambulatory Visit: Payer: Self-pay

## 2020-08-13 ENCOUNTER — Other Ambulatory Visit: Payer: Medicaid Other

## 2020-08-24 ENCOUNTER — Ambulatory Visit (INDEPENDENT_AMBULATORY_CARE_PROVIDER_SITE_OTHER): Payer: Medicaid Other | Admitting: Family Medicine

## 2020-08-24 ENCOUNTER — Other Ambulatory Visit: Payer: Self-pay

## 2020-08-24 ENCOUNTER — Other Ambulatory Visit (HOSPITAL_COMMUNITY)
Admission: RE | Admit: 2020-08-24 | Discharge: 2020-08-24 | Disposition: A | Discharge status: Home / Self Care | Payer: Medicaid Other | Source: Ambulatory Visit | Attending: Family Medicine | Admitting: Family Medicine

## 2020-08-24 VITALS — BP 123/79 | HR 71 | Temp 97.2°F | Resp 17 | Wt 194.0 lb

## 2020-08-24 DIAGNOSIS — Z7689 Persons encountering health services in other specified circumstances: Secondary | ICD-10-CM

## 2020-08-24 DIAGNOSIS — Z1231 Encounter for screening mammogram for malignant neoplasm of breast: Secondary | ICD-10-CM

## 2020-08-24 DIAGNOSIS — Z13228 Encounter for screening for other metabolic disorders: Secondary | ICD-10-CM

## 2020-08-24 DIAGNOSIS — Z131 Encounter for screening for diabetes mellitus: Secondary | ICD-10-CM

## 2020-08-24 DIAGNOSIS — Z113 Encounter for screening for infections with a predominantly sexual mode of transmission: Secondary | ICD-10-CM

## 2020-08-24 DIAGNOSIS — Z8679 Personal history of other diseases of the circulatory system: Secondary | ICD-10-CM

## 2020-08-24 DIAGNOSIS — Z1159 Encounter for screening for other viral diseases: Secondary | ICD-10-CM

## 2020-08-24 DIAGNOSIS — Z124 Encounter for screening for malignant neoplasm of cervix: Secondary | ICD-10-CM | POA: Insufficient documentation

## 2020-08-24 DIAGNOSIS — Z0000 Encounter for general adult medical examination without abnormal findings: Secondary | ICD-10-CM

## 2020-08-24 DIAGNOSIS — E785 Hyperlipidemia, unspecified: Secondary | ICD-10-CM

## 2020-08-24 DIAGNOSIS — Z130 Encounter for screening for diseases of the blood and blood-forming organs and certain disorders involving the immune mechanism: Secondary | ICD-10-CM

## 2020-08-24 LAB — POCT URINALYSIS DIP (CLINITEK)
Bilirubin, UA: NEGATIVE
Glucose, UA: NEGATIVE mg/dL
Ketones, POC UA: NEGATIVE mg/dL
Leukocytes, UA: NEGATIVE
Nitrite, UA: NEGATIVE
POC PROTEIN,UA: NEGATIVE
Spec Grav, UA: 1.025 (ref 1.010–1.025)
Urobilinogen, UA: 0.2 E.U./dL
pH, UA: 5.5 (ref 5.0–8.0)

## 2020-08-24 MED ORDER — VALACYCLOVIR HCL 1 G PO TABS: 1000.0000 mg | ORAL_TABLET | Freq: Two times a day (BID) | ORAL | 1 refills | Status: DC | PRN

## 2020-08-24 MED ORDER — CYCLOBENZAPRINE HCL 10 MG PO TABS
10.0000 mg | ORAL_TABLET | Freq: Three times a day (TID) | ORAL | 2 refills | Status: DC | PRN
Start: 2020-08-24 — End: 2021-05-25

## 2020-08-24 NOTE — Patient Instructions (Signed)

## 2020-08-24 NOTE — Progress Notes (Signed)
Patient ID: [name], female    DOB: 1976/02/20, 44 y.o.   MRN: [id number]  PCP: Patient, No Pcp Per  Chief Complaint  Patient presents with  . Establish Care  . Annual Exam    Subjective:  HPI Melanie Vincent is a 44 y.o. female, non smoker, presents for a complete physical and PAP.    Chronic conditions include: Patient Active Problem List   Diagnosis Date Noted  . Educated about COVID-19 virus infection 06/13/2020  . Precordial chest pain 06/13/2020  . Sleep apnea suspected 10/06/2015  . Anxiety reaction 09/15/2015  . PAF (paroxysmal atrial fibrillation) (HCC) 11/25/2014  . Herpes labialis 07/29/2013  . Well woman exam (no gynecological exam) 05/02/2013  . GERD (gastroesophageal reflux disease) 11/13/2012  . Overweight 06/14/2012     Chronic conditions: PAF, no recent recurrent abnormal heart rhythm. Followed by cardiology annually. GERD controlled. HSV, need refill Valtrex   Health Promotion: Current BMI: Body mass index is 30.84 kg/m.  Health Screening Current/Overdue:   Immunizations: COVID-19 vaccinated  PAP- obtaining today Mammogram: Due (step sister has breast cancer)   Current home medications include: Prior to Admission medications   Not on File    Family History  Problem Relation Age of Onset  . Diabetes Father   . Hypertension Father   . Stroke Father   . Colon polyps Father   . Diabetes Paternal Grandmother   . Heart disease Paternal Grandmother   . Diabetes Paternal Grandfather   . Heart disease Paternal Grandfather   . Colon cancer Paternal Aunt        late 82's  . Esophageal cancer Neg Hx   . Rectal cancer Neg Hx   . Stomach cancer Neg Hx     Allergies  Allergen Reactions  . Tramadol Other (See Comments)    Pt states that this medication caused insomnia.    Social History   Socioeconomic History  . Marital status: Single    Spouse name: Not on file  . Number of children: 4  . Years of education: Not on file  .  Highest education level: Not on file  Occupational History  . Occupation: Caregiver  Tobacco Use  . Smoking status: Former Smoker    Quit date: 06/15/2007    Years since quitting: 13.2  . Smokeless tobacco: Never Used  Vaping Use  . Vaping Use: Never used  Substance and Sexual Activity  . Alcohol use: Yes    Alcohol/week: 4.0 standard drinks    Types: 2 Glasses of wine, 2 Standard drinks or equivalent per week  . Drug use: Yes    Types: Marijuana    Comment: "quit months ago"  . Sexual activity: Yes    Birth control/protection: Surgical, Condom  Other Topics Concern  . Not on file  Social History Narrative   Lives at home with 3 children and has two others.        Social Determinants of Health   Financial Resource Strain:   . Difficulty of Paying Living Expenses: Not on file  Food Insecurity:   . Worried About Programme researcher, broadcasting/film/video in the Last Year: Not on file  . Ran Out of Food in the Last Year: Not on file  Transportation Needs:   . Lack of Transportation (Medical): Not on file  . Lack of Transportation (Non-Medical): Not on file  Physical Activity:   . Days of Exercise per Week: Not on file  . Minutes of Exercise per Session: Not on  file  Stress:   . Feeling of Stress : Not on file  Social Connections:   . Frequency of Communication with Friends and Family: Not on file  . Frequency of Social Gatherings with Friends and Family: Not on file  . Attends Religious Services: Not on file  . Active Member of Clubs or Organizations: Not on file  . Attends Banker Meetings: Not on file  . Marital Status: Not on file  Intimate Partner Violence:   . Fear of Current or Ex-Partner: Not on file  . Emotionally Abused: Not on file  . Physically Abused: Not on file  . Sexually Abused: Not on file   Review of Systems Pertinent negatives listed in HPI Past Medical, Surgical Family and Social History reviewed and updated.  Objective:   Today's Vitals   08/24/20 1048   BP: 123/79  Pulse: 71  Resp: 17  Temp: (!) 97.2 F (36.2 C)  TempSrc: Temporal  SpO2: 98%  Weight: 194 lb (88 kg)    Wt Readings from Last 3 Encounters:  08/24/20 194 lb (88 kg)  06/15/20 200 lb (90.7 kg)  05/12/20 202 lb 12.8 oz (92 kg)    Physical Exam Physical Exam: Constitutional: Patient appears well-developed and well-nourished. No distress. HENT: Normocephalic, atraumatic, External right and left ear normal. Oropharynx is clear and moist.  Eyes: Conjunctivae and EOM are normal. PERRLA, no scleral icterus. Neck: Normal ROM. Neck supple. No JVD. No tracheal deviation. No thyromegaly. CVS: RRR, S1/S2 +, no murmurs, no gallops, no carotid bruit.  Pulmonary: Effort and breath sounds normal, no stridor, rhonchi, wheezes, rales.  Abdominal: Soft. BS +, no distension, tenderness, rebound or guarding.  Musculoskeletal: Normal range of motion. No edema and no tenderness.  Chest Wall: Breasts are symmetric without cutaneous changes, nipple inversion or discharge. No masses or tenderness, and no axillary lymphadenopathy. Genitourinary :Normal female external genitalia without lesion. No inguinal lymphadenopathy. Vaginal mucosa is pink and moist without lesions. Cervix is closed without discharge, not friable. Pap smear obtained. No cervical motion tenderness, adnexal fullness or tenderness. Neuro: Alert. Normal reflexes, muscle tone coordination. No cranial nerve deficit. Skin: Skin is warm and dry. No rash noted. Not diaphoretic. No erythema. No pallor. Psychiatric: Normal mood and affect. Behavior, judgment, thought content normal.     Assessment & Plan:  1. Encounter to establish care 2. Annual physical exam -Age appropriate anticipatory guidance provided  - CBC with Differential - Comprehensive metabolic panel - Lipid Panel   3. Pap smear for cervical cancer screening - Cytology - PAP([place])  4. Breast cancer screening by mammogram - MM Digital Screening;  Future  5. Screening for STDs (sexually transmitted diseases) - Cervicovaginal ancillary only - RPR - HIV antibody (with reflex)  6. Need for hepatitis C screening test - HCV Ab w/Rflx to Verification  7. Screening for diabetes mellitus - Hemoglobin A1c  8. Screening for deficiency anemia - CBC with Differential  9. Screening for metabolic disorder - Comprehensive metabolic panel - POCT URINALYSIS DIP ([facility])  10. Hyperlipidemia, unspecified hyperlipidemia type - Lipid Panel  11. History of atrial fibrillation (followed by cardiology) - Thyroid Panel With TSH    Follow-up 12 months CPE, sooner if needed.  Joaquin Courts, FNP Primary Care at [facility] [address] [place] [address] [phone]: [phone]

## 2020-08-25 LAB — THYROID PANEL WITH TSH
Free Thyroxine Index: 1.7 (ref 1.2–4.9)
T3 Uptake Ratio: 28 % (ref 24–39)
T4, Total: 6.2 ug/dL (ref 4.5–12.0)
TSH: 0.525 u[IU]/mL (ref 0.450–4.500)

## 2020-08-25 LAB — COMPREHENSIVE METABOLIC PANEL
ALT: 16 IU/L (ref 0–32)
AST: 15 IU/L (ref 0–40)
Albumin/Globulin Ratio: 1.7 (ref 1.2–2.2)
Albumin: 4.7 g/dL (ref 3.8–4.8)
Alkaline Phosphatase: 62 IU/L (ref 44–121)
BUN/Creatinine Ratio: 22 (ref 9–23)
BUN: 15 mg/dL (ref 6–24)
Bilirubin Total: 0.2 mg/dL (ref 0.0–1.2)
CO2: 23 mmol/L (ref 20–29)
Calcium: 9.8 mg/dL (ref 8.7–10.2)
Chloride: 104 mmol/L (ref 96–106)
Creatinine, Ser: 0.69 mg/dL (ref 0.57–1.00)
GFR calc Af Amer: 123 mL/min/{1.73_m2} (ref >59)
GFR calc non Af Amer: 107 mL/min/{1.73_m2} (ref >59)
Globulin, Total: 2.7 g/dL (ref 1.5–4.5)
Glucose: 79 mg/dL (ref 65–99)
Potassium: 4.8 mmol/L (ref 3.5–5.2)
Sodium: 140 mmol/L (ref 134–144)
Total Protein: 7.4 g/dL (ref 6.0–8.5)

## 2020-08-25 LAB — CERVICOVAGINAL ANCILLARY ONLY
Bacterial Vaginitis (gardnerella): NEGATIVE
Candida Glabrata: NEGATIVE
Candida Vaginitis: NEGATIVE
Chlamydia: NEGATIVE
Comment: NEGATIVE
Comment: NEGATIVE
Comment: NEGATIVE
Comment: NEGATIVE
Comment: NEGATIVE
Comment: NORMAL
Neisseria Gonorrhea: NEGATIVE
Trichomonas: NEGATIVE

## 2020-08-25 LAB — HEMOGLOBIN A1C
Est. average glucose Bld gHb Est-mCnc: 114 mg/dL
Hgb A1c MFr Bld: 5.6 % (ref 4.8–5.6)

## 2020-08-25 LAB — CBC WITH DIFFERENTIAL/PLATELET
Basophils Absolute: 0 10*3/uL (ref 0.0–0.2)
Basos: 1 %
EOS (ABSOLUTE): 0.1 10*3/uL (ref 0.0–0.4)
Eos: 1 %
Hematocrit: 38.8 % (ref 34.0–46.6)
Hemoglobin: 12.4 g/dL (ref 11.1–15.9)
Immature Grans (Abs): 0 10*3/uL (ref 0.0–0.1)
Immature Granulocytes: 0 %
Lymphocytes Absolute: 2.8 10*3/uL (ref 0.7–3.1)
Lymphs: 36 %
MCH: 29.5 pg (ref 26.6–33.0)
MCHC: 32 g/dL (ref 31.5–35.7)
MCV: 92 fL (ref 79–97)
Monocytes Absolute: 0.6 10*3/uL (ref 0.1–0.9)
Monocytes: 7 %
Neutrophils Absolute: 4.2 10*3/uL (ref 1.4–7.0)
Neutrophils: 55 %
Platelets: 403 10*3/uL (ref 150–450)
RBC: 4.2 x10E6/uL (ref 3.77–5.28)
RDW: 13.2 % (ref 11.7–15.4)
WBC: 7.7 10*3/uL (ref 3.4–10.8)

## 2020-08-25 LAB — LIPID PANEL
Chol/HDL Ratio: 3.6 ratio (ref 0.0–4.4)
Cholesterol, Total: 206 mg/dL — ABNORMAL HIGH (ref 100–199)
HDL: 58 mg/dL (ref >39)
LDL Chol Calc (NIH): 138 mg/dL — ABNORMAL HIGH (ref 0–99)
Triglycerides: 54 mg/dL (ref 0–149)
VLDL Cholesterol Cal: 10 mg/dL (ref 5–40)

## 2020-08-25 LAB — HIV ANTIBODY (ROUTINE TESTING W REFLEX): HIV Screen 4th Generation wRfx: NONREACTIVE

## 2020-08-25 LAB — HCV AB W/RFLX TO VERIFICATION: HCV Ab: <0.1 s/co ratio (ref 0.0–0.9)

## 2020-08-25 LAB — RPR: RPR Ser Ql: NONREACTIVE

## 2020-08-25 LAB — HCV INTERPRETATION

## 2020-08-26 LAB — CYTOLOGY - PAP
Adequacy: ABSENT
Comment: NEGATIVE
Diagnosis: NEGATIVE
High risk HPV: NEGATIVE

## 2020-09-17 ENCOUNTER — Ambulatory Visit: Payer: Medicaid Other

## 2020-10-19 ENCOUNTER — Ambulatory Visit: Payer: Medicaid Other

## 2020-11-05 ENCOUNTER — Telehealth: Payer: Self-pay | Admitting: Cardiology

## 2020-11-05 NOTE — Telephone Encounter (Signed)
Patient called in reporting she is currently out of town in [place] visiting family. Developed a cold earlier this week and has been taking OTC medications. Reports she developed elevated HR earlier today in the 140s. Has a hx of PAF, Chadsvasc of 1, therefore not on any anticoagulation. Took a Dilt 30mg  tab with improvement in her HR to 100s. Rates are still in the 100s this evening and she is unsure what to do. I advised that she could take additional Dilt now and follow symptoms. If she develops chest pain, shortness of breath, dizziness or lightheadedness would need to be evaluated at the closest ER. She expressed understanding and thanked me for callback.

## 2020-12-07 ENCOUNTER — Ambulatory Visit
Admission: RE | Admit: 2020-12-07 | Discharge: 2020-12-07 | Disposition: A | Discharge status: Home / Self Care | Payer: Medicaid Other | Source: Ambulatory Visit | Attending: Family Medicine | Admitting: Family Medicine

## 2020-12-07 ENCOUNTER — Other Ambulatory Visit: Payer: Self-pay

## 2020-12-07 DIAGNOSIS — Z1231 Encounter for screening mammogram for malignant neoplasm of breast: Secondary | ICD-10-CM

## 2021-01-22 ENCOUNTER — Telehealth: Payer: Self-pay | Admitting: Medical

## 2021-01-22 NOTE — Telephone Encounter (Signed)
   Patient called the after hours line to report persistent atrial fibrillation. She woke up this morning and around 6am noticed she was in atrial fibrillation. She has taken 3 doses of flecainide 50mg  since onset of symptoms with her last dose being 30 minutes ago. At the time of my call she is laying down and thinks her symptoms are better. BP at the time of my call is 132/80, HR 73. She does report drinking a small amount of alcohol last night which is one of her triggers. Overall no significant chest pain, SOB, dizziness, lightheadedness, or syncope. Reviewed her last office note, at which time she was ordered for flecainide 100mg  tabs to take 300mg  at onset of atrial fibrillation, in addition to a prn diltiazem 30mg  q4hr if needed for HR >100. I reviewed these instructions with the patient. She will take an additional 150mg  of flecainide now and use prn diltiazem if HR is persistently elevated. We discussed ED precautions. Will attempt to get her a visit in the Afib clinic early next week. She was in agreement with the plan and appreciative of the call.   , PA-C 01/22/21; 12:35 PM

## 2021-01-24 ENCOUNTER — Encounter (HOSPITAL_COMMUNITY): Payer: Self-pay | Admitting: Nurse Practitioner

## 2021-01-24 ENCOUNTER — Other Ambulatory Visit: Payer: Self-pay | Admitting: Nurse Practitioner

## 2021-01-24 ENCOUNTER — Other Ambulatory Visit: Payer: Self-pay

## 2021-01-24 ENCOUNTER — Ambulatory Visit (HOSPITAL_COMMUNITY)
Admission: RE | Admit: 2021-01-24 | Discharge: 2021-01-24 | Disposition: A | Discharge status: Home / Self Care | Payer: Medicaid Other | Source: Ambulatory Visit | Attending: Nurse Practitioner | Admitting: Nurse Practitioner

## 2021-01-24 VITALS — BP 106/68 | HR 86 | Ht 66.5 in | Wt 210.4 lb

## 2021-01-24 DIAGNOSIS — Z79899 Other long term (current) drug therapy: Secondary | ICD-10-CM | POA: Diagnosis not present

## 2021-01-24 DIAGNOSIS — Z87891 Personal history of nicotine dependence: Secondary | ICD-10-CM | POA: Insufficient documentation

## 2021-01-24 DIAGNOSIS — I480 Paroxysmal atrial fibrillation: Secondary | ICD-10-CM | POA: Diagnosis present

## 2021-01-24 MED ORDER — FLECAINIDE ACETATE 150 MG PO TABS
ORAL_TABLET | ORAL | 1 refills | Status: DC
Start: 2021-01-24 — End: 2021-12-01

## 2021-01-24 MED ORDER — DILTIAZEM HCL 30 MG PO TABS
ORAL_TABLET | ORAL | 3 refills | Status: DC
Start: 2021-01-24 — End: 2021-12-01

## 2021-01-24 MED FILL — dilTIAZem HCL 30 MG TABS: 30 | 30 days supply | Qty: 30 | Fill #0

## 2021-01-24 NOTE — Progress Notes (Signed)
Primary Care Physician: Bing Neighbors, FNP Referring Physician: Antoine Poche Cardiologist: Melanie Vincent is a 45 y.o. female with a h/o paroxysmal afib, that is in the afib clinic for an episode of afib  2/12. She did eventually convert with flecainide 50 mg x 3 doses over several hours. She did have alcohol the evening  before. This is often her trigger. She is not on any rate control or flecainide daily. She does not want to take daily. She has around 2 episodes a year but would like to discuss having  an ablation. She has been under more stress recently and has been drinking more coffee as well. Echo in April 2021 showed normal left atrium and normal EF.   Today, she denies symptoms of palpitations, chest pain, shortness of breath, orthopnea, PND, lower extremity edema, dizziness, presyncope, syncope, or neurologic sequela. The patient is tolerating medications without difficulties and is otherwise without complaint today.   Past Medical History:  Diagnosis Date  . Anxiety   . Breast lump    Mammogram (last 2013)  . DDD (degenerative disc disease), lumbar   . GERD (gastroesophageal reflux disease)   . Herpes simplex labialis   . PAF (paroxysmal atrial fibrillation) (HCC)    Past Surgical History:  Procedure Laterality Date  . CESAREAN SECTION    . TUBAL LIGATION     2008    Current Outpatient Medications  Medication Sig Dispense Refill  . cyclobenzaprine (FLEXERIL) 10 MG tablet Take 1 tablet (10 mg total) by mouth 3 (three) times daily as needed for muscle spasms. 90 tablet 2  . diltiazem (CARDIZEM) 30 MG tablet Take 1 tablet every 4 hours AS NEEDED for heart rate >100 30 tablet 3  . flecainide (TAMBOCOR) 150 MG tablet Take 1-2 tablets by mouth once for afib can only use every 4 days 6 tablet 1  . omeprazole (PRILOSEC) 20 MG capsule Take 20 mg by mouth daily.    . valACYclovir (VALTREX) 1000 MG tablet Take 1 tablet (1,000 mg total) by mouth 2 (two) times  daily as needed (for cold sores). 60 tablet 1   No current facility-administered medications for this encounter.    Allergies  Allergen Reactions  . Tramadol Other (See Comments)    Pt states that this medication caused insomnia.    Social History   Socioeconomic History  . Marital status: Single    Spouse name: Not on file  . Number of children: 4  . Years of education: Not on file  . Highest education level: Not on file  Occupational History  . Occupation: Caregiver  Tobacco Use  . Smoking status: Former Smoker    Quit date: 06/15/2007    Years since quitting: 13.6  . Smokeless tobacco: Never Used  Vaping Use  . Vaping Use: Never used  Substance and Sexual Activity  . Alcohol use: Yes    Alcohol/week: 4.0 standard drinks    Types: 2 Glasses of wine, 2 Standard drinks or equivalent per week  . Drug use: Yes    Types: Marijuana    Comment: "quit months ago"  . Sexual activity: Yes    Birth control/protection: Surgical, Condom  Other Topics Concern  . Not on file  Social History Narrative   Lives at home with 3 children and has two others.        Social Determinants of Health   Financial Resource Strain: Not on file  Food Insecurity: Not on file  Transportation Needs: Not on file  Physical Activity: Not on file  Stress: Not on file  Social Connections: Not on file  Intimate Partner Violence: Not on file    Family History  Problem Relation Age of Onset  . Diabetes Father   . Hypertension Father   . Stroke Father   . Colon polyps Father   . Breast cancer Half-Sister 46  . Diabetes Paternal Grandmother   . Heart disease Paternal Grandmother   . Diabetes Paternal Grandfather   . Heart disease Paternal Grandfather   . Colon cancer Paternal Aunt        late 58's  . Breast cancer Maternal Aunt   . Esophageal cancer Neg Hx   . Rectal cancer Neg Hx   . Stomach cancer Neg Hx     ROS- All systems are reviewed and negative except as per the HPI  above  Physical Exam: Vitals:   01/24/21 1532  BP: 106/68  Pulse: 86  Weight: 95.4 kg  Height: 5' 6.5" (1.689 m)   Wt Readings from Last 3 Encounters:  01/24/21 95.4 kg  08/24/20 88 kg  06/15/20 90.7 kg    Labs: Lab Results  Component Value Date   NA 140 08/24/2020   K 4.8 08/24/2020   CL 104 08/24/2020   CO2 23 08/24/2020   GLUCOSE 79 08/24/2020   BUN 15 08/24/2020   CREATININE 0.69 08/24/2020   CALCIUM 9.8 08/24/2020   MG 2.1 04/01/2018   Lab Results  Component Value Date   INR 1.02 09/15/2015   Lab Results  Component Value Date   CHOL 206 (H) 08/24/2020   HDL 58 08/24/2020   LDLCALC 138 (H) 08/24/2020   TRIG 54 08/24/2020     GEN- The patient is well appearing, alert and oriented x 3 today.   Head- normocephalic, atraumatic Eyes-  Sclera clear, conjunctiva pink Ears- hearing intact Oropharynx- clear Neck- supple, no JVP Lymph- no cervical lymphadenopathy Lungs- Clear to ausculation bilaterally, normal work of breathing Heart- Regular rate and rhythm, no murmurs, rubs or gallops, PMI not laterally displaced GI- soft, NT, ND, + BS Extremities- no clubbing, cyanosis, or edema MS- no significant deformity or atrophy Skin- no rash or lesion Psych- euthymic mood, full affect Neuro- strength and sensation are intact  EKG-NSR at 86 bpm, normal ekg,  Pr int 156 ms, qrs int 72 ms, qtc 421 ms   Echo-1. Left ventricular ejection fraction, by estimation, is 60 to 65%. The  left ventricle has normal function. The left ventricle has no regional  wall motion abnormalities. Left ventricular diastolic parameters were  normal.  2. Right ventricular systolic function is normal. The right ventricular  size is normal. There is normal pulmonary artery systolic pressure.  3. The mitral valve is abnormal. Trivial mitral valve regurgitation.  4. The aortic valve is tricuspid. Aortic valve regurgitation is not  visualized.  5. The inferior vena cava is normal in size  with greater than 50%  respiratory variability, suggesting right atrial pressure of 3 mmHg.    Assessment and Plan: 1. Paroxysmal afib  General info re afib and triggers She was encouraged to cut back on alcohol and caffeine Alcohol has been her trigger in the past We reviewed again how to take flecainide Since she is not on rate control daily, she will start with 30 mg Cardizem first  Wait 30 mins and if not converted, can take flecainide  I will call in 150 mg tabs of flecainide  She  can take 150 mg and if has not converted in 4 hours, take another 150 mg flecainide but no more x every  4 days, or can opt to take the 300 mg all at once I will also refer her to EP to discuss ablation as she does not want to take daily meds   2. CHA2DS2VASc score of 1 Not on daily anticoagulation per guidelines  F/u with EP to discuss ablation    Melanie Leash [facility] [address] [place], [place] [address] [phone]

## 2021-01-24 NOTE — Patient Instructions (Addendum)
If you go into afib -- start with 30mg  of cardizem (diltiazem) if in 30 minutes no respond can take a 150mg  tablet of flecainide.  If no response within 4 hours can repeat 150mg  of flecainide.  Can only repeat the 300mg  of flecainide dose every 4 days.

## 2021-01-25 MED FILL — FLECAINIDE ACETATE 100 MG T: 100 | 12 days supply | Qty: 9 | Fill #0

## 2021-02-09 ENCOUNTER — Institutional Professional Consult (permissible substitution): Payer: Medicaid Other | Admitting: Internal Medicine

## 2021-03-14 ENCOUNTER — Other Ambulatory Visit: Payer: Self-pay

## 2021-03-14 ENCOUNTER — Ambulatory Visit (INDEPENDENT_AMBULATORY_CARE_PROVIDER_SITE_OTHER): Payer: Medicaid Other | Admitting: Internal Medicine

## 2021-03-14 ENCOUNTER — Encounter: Payer: Self-pay | Admitting: Internal Medicine

## 2021-03-14 VITALS — BP 108/64 | HR 65 | Ht 66.5 in | Wt 213.0 lb

## 2021-03-14 DIAGNOSIS — I480 Paroxysmal atrial fibrillation: Secondary | ICD-10-CM

## 2021-03-14 NOTE — Patient Instructions (Addendum)
Medication Instructions:  Your physician recommends that you continue on your current medications as directed. Please refer to the Current Medication list given to you today.  Labwork: None ordered.  Testing/Procedures: None ordered.  Follow-Up: Your physician wants you to follow-up in: 6 months with the Afib Clinic. They will contact you to schedule.    Any Other Special Instructions Will Be Listed Below (If Applicable).  If you need a refill on your cardiac medications before your next appointment, please call your pharmacy.         

## 2021-03-14 NOTE — Progress Notes (Signed)
Electrophysiology Office Note   Date:  03/14/2021   ID:  RALYNN SAN, DOB Nov 22, 1976, MRN [id number]  PCP:  Bing Neighbors, FNP  Cardiologist:  Dr Antoine Poche Primary Electrophysiologist: Hillis Range, MD    CC: afib   History of Present Illness: Melanie Vincent is a 45 y.o. female who presents today for electrophysiology evaluation.   She is referred by Dr Antoine Poche and Rudi Coco for EP consultation regarding afib.  The patient has had afib since at least 2015 (as documented by my EKG review).  She has used flecainide as a pill in pocket option.  Triggers of afib include alcohol.  She has palpitations and fatigue with afib. Episodes occur about twice per year and last up to 12 hours.  She finds that they will readily terminate with flecainide.   Today, she denies symptoms of palpitations, chest pain, shortness of breath, orthopnea, PND, lower extremity edema, claudication, dizziness, presyncope, syncope, bleeding, or neurologic sequela. The patient is tolerating medications without difficulties and is otherwise without complaint today.    Past Medical History:  Diagnosis Date  . Anxiety   . Breast lump    Mammogram (last 2013)  . DDD (degenerative disc disease), lumbar   . GERD (gastroesophageal reflux disease)   . Herpes simplex labialis   . PAF (paroxysmal atrial fibrillation) (HCC)    Past Surgical History:  Procedure Laterality Date  . CESAREAN SECTION    . TUBAL LIGATION     2008     Current Outpatient Medications  Medication Sig Dispense Refill  . cyclobenzaprine (FLEXERIL) 10 MG tablet Take 1 tablet (10 mg total) by mouth 3 (three) times daily as needed for muscle spasms. 90 tablet 2  . diltiazem (CARDIZEM) 30 MG tablet Take 1 tablet every 4 hours AS NEEDED for heart rate >100 30 tablet 3  . flecainide (TAMBOCOR) 150 MG tablet Take 1-2 tablets by mouth once for afib can only use every 4 days 6 tablet 1  . omeprazole (PRILOSEC) 20 MG capsule Take 20 mg  by mouth daily.    . valACYclovir (VALTREX) 1000 MG tablet Take 1 tablet (1,000 mg total) by mouth 2 (two) times daily as needed (for cold sores). 60 tablet 1   No current facility-administered medications for this visit.    Allergies:   Tramadol   Social History:  The patient  reports that she quit smoking about 13 years ago. She has never used smokeless tobacco. She reports current alcohol use of about 4.0 standard drinks of alcohol per week. She reports current drug use. Drug: Marijuana.   Family History:  The patient's  family history includes Breast cancer in her maternal aunt; Breast cancer (age of onset: 43) in her half-sister; Colon cancer in her paternal aunt; Colon polyps in her father; Diabetes in her father, paternal grandfather, and paternal grandmother; Heart disease in her paternal grandfather and paternal grandmother; Hypertension in her father; Stroke in her father.    ROS:  Please see the history of present illness.   All other systems are personally reviewed and negative.    PHYSICAL EXAM: VS:  BP 108/64   Pulse 65   Ht 5' 6.5" (1.689 m)   Wt 213 lb (96.6 kg)   SpO2 98%   BMI 33.86 kg/m  , BMI Body mass index is 33.86 kg/m. GEN: Well nourished, well developed, in no acute distress HEENT: normal Neck: no JVD  Cardiac: RRR  Respiratory:    normal work of breathing  GI: soft,  MS: no deformity or atrophy Skin: warm and dry  Neuro:  Strength and sensation are intact Psych: euthymic mood, full affect  EKG:  EKG is ordered today. The ekg ordered today is personally reviewed and shows sinus rhythm   Recent Labs: 08/24/2020: ALT 16; BUN 15; Creatinine, Ser 0.69; Hemoglobin 12.4; Platelets 403; Potassium 4.8; Sodium 140; TSH 0.525  personally reviewed   Lipid Panel     Component Value Date/Time   CHOL 206 (H) 08/24/2020 1202   TRIG 54 08/24/2020 1202   HDL 58 08/24/2020 1202   CHOLHDL 3.6 08/24/2020 1202   CHOLHDL 3.7 Ratio 06/01/2009 2050   VLDL 12  06/01/2009 2050   LDLCALC 138 (H) 08/24/2020 1202   personally reviewed   Wt Readings from Last 3 Encounters:  03/14/21 213 lb (96.6 kg)  01/24/21 210 lb 6.4 oz (95.4 kg)  08/24/20 194 lb (88 kg)      Other studies personally reviewed: Additional studies/ records that were reviewed today include: AF clinic notes, prior ekgs  Review of the above records today demonstrates: as above  Echo 04/07/20- EF 60%  ASSESSMENT AND PLAN:  1.  Paroxysmal atrial fibrillation The patient has symptomatic, recurrent  atrial fibrillation. I think her AF is too infrequent for daily medicines Chads2vasc score is 1.  she does not require OAC at this time. Therapeutic strategies for afib including lifestyle modification and ablation were discussed in detail with the patient today. Risk, benefits, and alternatives to EP study and radiofrequency ablation for afib were also discussed in detail today. These risks include but are not limited to stroke, bleeding, vascular damage, tamponade, perforation, damage to the esophagus, lungs, and other structures, pulmonary vein stenosis, worsening renal function, and death. The patient understands these risk and wishes to avoid ablation at this time. She would like to work on regular exercise and weight loss.  She will follow closely with the AF clinic.  If her afib progresses, she may then consider ablation.      Randolm Idol, MD  03/14/2021 9:33 AM     [facility] [address] [place] [address] [phone] (office) [phone] (fax)

## 2021-04-06 ENCOUNTER — Telehealth: Payer: Self-pay | Admitting: Cardiology

## 2021-04-06 NOTE — Telephone Encounter (Signed)
    Pt c/o of Chest Pain: STAT if CP now or developed within 24 hours  1. Are you having CP right now? Yes  2. Are you experiencing any other symptoms (ex. SOB, nausea, vomiting, sweating)? Nausea   3. How long have you been experiencing CP? Last night   4. Is your CP continuous or coming and going? Coming and going   5. Have you taken Nitroglycerin? No, don't have one ? Been feeling it couple of weeks, but last night after she ate dinner, feels like a bubble on her stomach, lay down feel pain on her chest, until today it comes and go, not sure if its indigestion or CP

## 2021-04-06 NOTE — Telephone Encounter (Addendum)
Pt called to report that she has had indigestion symptoms since she had dinner last night.. she has had reflux and feels like there is a "ball" in her stomach... she says she is worried that she keeps belching and it triggers a sharp pain in her chest. It also hurts to take a deep breath.   She says she read on the internet that it could be an MI and feels she should go to the ED... I advised her that I agree if she is still having symptoms and she is and I also suggested that she does not drive herself and she agreed.

## 2021-04-11 ENCOUNTER — Other Ambulatory Visit: Payer: Self-pay

## 2021-04-11 ENCOUNTER — Encounter (HOSPITAL_COMMUNITY): Payer: Self-pay | Admitting: Pharmacy Technician

## 2021-04-11 ENCOUNTER — Emergency Department (HOSPITAL_COMMUNITY): Payer: Medicaid Other

## 2021-04-11 ENCOUNTER — Emergency Department (HOSPITAL_COMMUNITY)
Admission: EM | Admit: 2021-04-11 | Discharge: 2021-04-11 | Disposition: A | Discharge status: Home / Self Care | Payer: Medicaid Other | Attending: Emergency Medicine | Admitting: Emergency Medicine

## 2021-04-11 DIAGNOSIS — Z87891 Personal history of nicotine dependence: Secondary | ICD-10-CM | POA: Diagnosis not present

## 2021-04-11 DIAGNOSIS — R079 Chest pain, unspecified: Secondary | ICD-10-CM

## 2021-04-11 DIAGNOSIS — R0602 Shortness of breath: Secondary | ICD-10-CM | POA: Diagnosis present

## 2021-04-11 DIAGNOSIS — D649 Anemia, unspecified: Secondary | ICD-10-CM | POA: Diagnosis not present

## 2021-04-11 LAB — CBC
HCT: 36.4 % (ref 36.0–46.0)
Hemoglobin: 11.7 g/dL — ABNORMAL LOW (ref 12.0–15.0)
MCH: 29.5 pg (ref 26.0–34.0)
MCHC: 32.1 g/dL (ref 30.0–36.0)
MCV: 91.7 fL (ref 80.0–100.0)
Platelets: 382 10*3/uL (ref 150–400)
RBC: 3.97 MIL/uL (ref 3.87–5.11)
RDW: 13.4 % (ref 11.5–15.5)
WBC: 5.7 10*3/uL (ref 4.0–10.5)
nRBC: 0 % (ref 0.0–0.2)

## 2021-04-11 LAB — BASIC METABOLIC PANEL
Anion gap: 7 (ref 5–15)
BUN: 11 mg/dL (ref 6–20)
CO2: 25 mmol/L (ref 22–32)
Calcium: 9.1 mg/dL (ref 8.9–10.3)
Chloride: 105 mmol/L (ref 98–111)
Creatinine, Ser: 0.69 mg/dL (ref 0.44–1.00)
GFR, Estimated: >60 mL/min (ref >60)
Glucose, Bld: 93 mg/dL (ref 70–99)
Potassium: 4.3 mmol/L (ref 3.5–5.1)
Sodium: 137 mmol/L (ref 135–145)

## 2021-04-11 LAB — I-STAT BETA HCG BLOOD, ED (MC, WL, AP ONLY): I-stat hCG, quantitative: <5 m[IU]/mL (ref <5)

## 2021-04-11 LAB — TROPONIN I (HIGH SENSITIVITY)
Troponin I (High Sensitivity): 3 ng/L (ref <18)
Troponin I (High Sensitivity): 2 ng/L (ref <18)

## 2021-04-11 NOTE — ED Notes (Signed)
Patient transported to X-ray 

## 2021-04-11 NOTE — ED Notes (Signed)
Pt returns from xray

## 2021-04-11 NOTE — Discharge Instructions (Addendum)
Call your primary care doctor or specialist as discussed in the next 2-3 days.   Return immediately back to the ER if:  Your symptoms worsen within the next 12-24 hours. You develop new symptoms such as new fevers, persistent vomiting, new pain, shortness of breath, or new weakness or numbness, or if you have any other concerns.  

## 2021-04-11 NOTE — ED Provider Notes (Signed)
[facility] EMERGENCY DEPARTMENT Provider Note   CSN: [id number] Arrival date & time: 04/11/21  9935     History Chief Complaint  Patient presents with  . Shortness of Breath    Melanie Vincent is a 45 y.o. female.  Patient presents chief complaint of shortness of breath and some chest tightness.  Symptoms ongoing since yesterday.  At rest it seems better but when she takes a deep breath she feels that the sensation is worse.  Denies any fevers or cough.  No vomiting or diarrhea.  No radiation of the chest discomfort otherwise.        Past Medical History:  Diagnosis Date  . Anxiety   . Breast lump    Mammogram (last 2013)  . DDD (degenerative disc disease), lumbar   . GERD (gastroesophageal reflux disease)   . Herpes simplex labialis   . PAF (paroxysmal atrial fibrillation) [facility])     Patient Active Problem List   Diagnosis Date Noted  . Educated about COVID-19 virus infection 06/13/2020  . Precordial chest pain 06/13/2020  . Sleep apnea suspected 10/06/2015  . Anxiety reaction 09/15/2015  . PAF (paroxysmal atrial fibrillation) (HCC) 11/25/2014  . Herpes labialis 07/29/2013  . Well woman exam (no gynecological exam) 05/02/2013  . GERD (gastroesophageal reflux disease) 11/13/2012  . Overweight 06/14/2012    Past Surgical History:  Procedure Laterality Date  . CESAREAN SECTION    . TUBAL LIGATION     2008     OB History    Gravida  6   Para  4   Term  4   Preterm      AB  2   Living  5     SAB      IAB  2   Ectopic      Multiple  1   Live Births           Obstetric Comments  Twins c-section in 2008        Family History  Problem Relation Age of Onset  . Diabetes Father   . Hypertension Father   . Stroke Father   . Colon polyps Father   . Breast cancer Half-Sister 12  . Diabetes Paternal Grandmother   . Heart disease Paternal Grandmother   . Diabetes Paternal Grandfather   . Heart disease Paternal  Grandfather   . Colon cancer Paternal Aunt        late 56's  . Breast cancer Maternal Aunt   . Esophageal cancer Neg Hx   . Rectal cancer Neg Hx   . Stomach cancer Neg Hx     Social History   Tobacco Use  . Smoking status: Former Smoker    Quit date: 06/15/2007    Years since quitting: 13.8  . Smokeless tobacco: Never Used  Vaping Use  . Vaping Use: Never used  Substance Use Topics  . Alcohol use: Yes    Alcohol/week: 4.0 standard drinks    Types: 2 Glasses of wine, 2 Standard drinks or equivalent per week    Comment: 3 drinks per week  . Drug use: Yes    Types: Marijuana    Comment: "quit months ago"    Home Medications Prior to Admission medications   Medication Sig Start Date End Date Taking? Authorizing Provider  cyclobenzaprine (FLEXERIL) 10 MG tablet Take 1 tablet (10 mg total) by mouth 3 (three) times daily as needed for muscle spasms. 08/24/20  Yes Bing Neighbors, FNP  diltiazem (  CARDIZEM) 30 MG tablet Take 1 tablet every 4 hours AS NEEDED for heart rate >100 Patient taking differently: Take 30 mg by mouth See admin instructions. Take 1 tablet every 4 hours AS NEEDED for heart rate >100 01/24/21  Yes Newman Nip, NP  flecainide (TAMBOCOR) 150 MG tablet Take 1-2 tablets by mouth once for afib can only use every 4 days Patient taking differently: Take 150-300 mg by mouth See admin instructions. Take 1-2 tablets by mouth once for afib can only use every 4 days 01/24/21  Yes Newman Nip, NP  omeprazole (PRILOSEC) 20 MG capsule Take 20 mg by mouth daily as needed (acid reflux). 12/24/20  Yes [provider]  valACYclovir (VALTREX) 1000 MG tablet Take 1 tablet (1,000 mg total) by mouth 2 (two) times daily as needed (for cold sores). 08/24/20  Yes Bing Neighbors, FNP    Allergies    Patient has no active allergies.  Review of Systems   Review of Systems  Constitutional: Negative for fever.  HENT: Negative for ear pain.   Eyes: Negative for pain.   Respiratory: Positive for shortness of breath. Negative for cough.   Cardiovascular: Positive for chest pain.  Gastrointestinal: Negative for abdominal pain.  Genitourinary: Negative for flank pain.  Musculoskeletal: Negative for back pain.  Skin: Negative for rash.  Neurological: Negative for headaches.    Physical Exam Updated Vital Signs BP 123/73 (BP Location: Right Arm)   Pulse 66   Temp 98 F (36.7 C) (Oral)   Resp 18   Ht 5\' 6"  (1.676 m)   Wt 90.7 kg   LMP 04/07/2021   SpO2 99%   BMI 32.28 kg/m   Physical Exam Constitutional:      General: She is not in acute distress.    Appearance: Normal appearance.  HENT:     Head: Normocephalic.     Nose: Nose normal.  Eyes:     Extraocular Movements: Extraocular movements intact.  Cardiovascular:     Rate and Rhythm: Normal rate.  Pulmonary:     Effort: Pulmonary effort is normal.  Musculoskeletal:        General: Normal range of motion.     Cervical back: Normal range of motion.  Neurological:     General: No focal deficit present.     Mental Status: She is alert. Mental status is at baseline.     ED Results / Procedures / Treatments   Labs (all labs ordered are listed, but only abnormal results are displayed) Labs Reviewed  CBC - Abnormal; Notable for the following components:      Result Value   Hemoglobin 11.7 (*)    All other components within normal limits  BASIC METABOLIC PANEL  I-STAT BETA HCG BLOOD, ED ([facility], [facility], [facility] ONLY)  TROPONIN I (HIGH SENSITIVITY)  TROPONIN I (HIGH SENSITIVITY)    EKG None  Radiology DG Chest 2 View  Result Date: 04/11/2021 CLINICAL DATA:  45 year old female with left side chest pain, shortness of breath, left arm numbness and weakness. Former smoker. EXAM: CHEST - 2 VIEW COMPARISON:  Chest radiographs 05/17/2020 and earlier. FINDINGS: Lung volumes and mediastinal contours remain normal. Visualized tracheal air column is within normal limits. Both lungs appear stable and  clear. No pneumothorax or pleural effusion. No acute osseous abnormality identified. Negative visible bowel gas pattern. IMPRESSION: Negative.  No cardiopulmonary abnormality. Electronically Signed   By: 07/17/2020 M.D.   On: 04/11/2021 09:38    Procedures Procedures  Medications Ordered in ED Medications - No data to display  ED Course  I have reviewed the triage vital signs and the nursing notes.  Pertinent labs & imaging results that were available during my care of the patient were reviewed by me and considered in my medical decision making (see chart for details).    MDM Rules/Calculators/A&P                          Troponin negative x2.  Chemistry and CBC within normal limits mildly anemic at 11.7.  Chest x-ray shows no evidence of clear infiltrate.  Patient states her symptoms of improved and she wants to go home at this time.  Advised follow-up with cardiology within 2 or 3 days, advised immediate return for worsening symptoms trouble breathing or any additional concerns.   Final Clinical Impression(s) / ED Diagnoses Final diagnoses:  Shortness of breath  Chest pain, unspecified type    Rx / DC Orders ED Discharge Orders    None       Cheryll Cockayne, MD 04/11/21 1251

## 2021-04-11 NOTE — ED Notes (Signed)
Discharge instructions and follow up care reviewed and explained, pt verbalized understanding. 

## 2021-04-11 NOTE — ED Triage Notes (Signed)
Pt here pov with reports of palpitations and shob for the last week. Pt goes to afib clinic. Called them and was told to come to the ED.

## 2021-04-12 ENCOUNTER — Telehealth: Payer: Self-pay | Admitting: *Deleted

## 2021-04-12 NOTE — Telephone Encounter (Signed)
Transition Care Management Follow-up Telephone Call  Date of discharge and from where: 04/11/2021 - [facility]  How have you been since you were released from the hospital? "Fine"  Any questions or concerns? No  Items Reviewed:  Did the pt receive and understand the discharge instructions provided? Yes   Medications obtained and verified? Yes   Other? No   Any new allergies since your discharge? No   Dietary orders reviewed? No  Do you have support at home? Yes    Functional Questionnaire: (I = Independent and D = Dependent) ADLs: I  Bathing/Dressing- I  Meal Prep- I  Eating- I  Maintaining continence- I  Transferring/Ambulation- I  Managing Meds- I  Follow up appointments reviewed:   PCP Hospital f/u appt confirmed? No    Specialist Hospital f/u appt confirmed? No    Are transportation arrangements needed? N/A  If their condition worsens, is the pt aware to call PCP or go to the Emergency Dept.? Yes  Was the patient provided with contact information for the PCP's office or ED? Yes  Was to pt encouraged to call back with questions or concerns? Yes

## 2021-04-15 ENCOUNTER — Other Ambulatory Visit: Payer: Self-pay

## 2021-05-23 ENCOUNTER — Ambulatory Visit (INDEPENDENT_AMBULATORY_CARE_PROVIDER_SITE_OTHER): Payer: Medicaid Other | Admitting: Podiatry

## 2021-05-23 ENCOUNTER — Ambulatory Visit (INDEPENDENT_AMBULATORY_CARE_PROVIDER_SITE_OTHER): Payer: Medicaid Other

## 2021-05-23 ENCOUNTER — Other Ambulatory Visit: Payer: Self-pay

## 2021-05-23 DIAGNOSIS — M7752 Other enthesopathy of left foot: Secondary | ICD-10-CM

## 2021-05-23 DIAGNOSIS — M778 Other enthesopathies, not elsewhere classified: Secondary | ICD-10-CM

## 2021-05-23 DIAGNOSIS — L989 Disorder of the skin and subcutaneous tissue, unspecified: Secondary | ICD-10-CM

## 2021-05-23 MED ORDER — BETAMETHASONE SOD PHOS & ACET 6 (3-3) MG/ML IJ SUSP
3.0000 mg | Freq: Once | INTRAMUSCULAR | Status: AC
  Administered 2021-05-23: 3 mg via INTRA_ARTICULAR

## 2021-05-23 MED ORDER — MELOXICAM 15 MG PO TABS
15.0000 mg | ORAL_TABLET | Freq: Every day | ORAL | 1 refills | Status: DC
Start: 2021-05-23 — End: 2021-09-14
  Filled 2021-05-23: qty 30, 30d supply, fill #0

## 2021-05-23 NOTE — Progress Notes (Signed)
Patient rescheduled appointment.

## 2021-05-23 NOTE — Progress Notes (Signed)
   HPI: 45 y.o. female presenting today as a new patient for evaluation of a bone spur to the left foot this been painful for the last 2-3 weeks.  Patient states that she has a bone sticking out of the side of her foot and it is been present for several years.  It is never been symptomatic up until about 2-3 weeks ago.  She denies a history of injury.  She would like to have it evaluated.  She has not done anything for treatment at the moment.  Patient also states that she has a corn to the fifth toe of the right foot that symptomatic.  She tries to trim it down herself.  She would like to have it evaluated as well  Past Medical History:  Diagnosis Date   Anxiety    Breast lump    Mammogram (last 2013)   DDD (degenerative disc disease), lumbar    GERD (gastroesophageal reflux disease)    Herpes simplex labialis    PAF (paroxysmal atrial fibrillation) (HCC)      Physical Exam: General: The patient is alert and oriented x3 in no acute distress.  Dermatology: Skin is warm, dry and supple bilateral lower extremities. Negative for open lesions or macerations.  Hyperkeratotic skin lesion noted overlying the PIPJ of the fifth digit right foot  Vascular: Palpable pedal pulses bilaterally. No edema or erythema noted. Capillary refill within normal limits.  Neurological: Epicritic and protective threshold grossly intact bilaterally.   Musculoskeletal Exam: Range of motion within normal limits to all pedal and ankle joints bilateral. Muscle strength 5/5 in all groups bilateral.  Palpable osseous protrusion at the base of the fourth metatarsal noted to the lateral aspect of the left foot.  There is some tenderness to palpation of this area  Radiographic Exam:  Normal osseous mineralization. Joint spaces preserved. No fracture/dislocation/boney destruction.  The osseous lesion is not well visualized on radiographic exam.  It seems to be very small approximately 5 mm in diameter based on palpation  clinically and slightly tense the skin approximately 1-2 mm  Assessment: 1.  Bone spur/exostosis left foot around the base of the fourth/fifth metatarsal 2.  Corn/benign skin lesion right fifth digit   Plan of Care:  1. Patient evaluated. X-Rays reviewed.  2.  The area is painful so we decided to inject 0.5 cc Celestone Soluspan injected into the left lateral foot 3.  Prescription for meloxicam 15 mg daily 4.  Recommend wearing shoes that do not irritate or aggravate this area 5.  Excisional debridement of the hyperkeratotic corn lesion was performed using a 312 scalpel without incident or bleeding.  Patient felt immediate relief 6.  Recommend OTC corn callus remover 7.  Return to clinic as needed  *Scrub tech.  Currently working traveling contracts      Felecia Shelling, DPM [facility]  Dr. Felecia Shelling, DPM    [address] [place], [place] [address]                Office [phone]  Fax [phone]

## 2021-05-24 ENCOUNTER — Encounter: Payer: Medicaid Other | Admitting: Family

## 2021-05-24 ENCOUNTER — Telehealth: Payer: Self-pay

## 2021-05-24 NOTE — Progress Notes (Deleted)
Pt presents for bloating and pain in stomach feels like something moving in stomach radiates to chest causing mild pain at times, states that she has gurgley feeling in chest other symptoms include fatigue and weakness  Pt found tick approx 3 weeks ago not sure if this has anything to do with it  Pt needs refills on omeprazole, cyclobenzaprine, and valacyclovir

## 2021-05-24 NOTE — Telephone Encounter (Signed)
Spoke to pt to complete virtual telephone visit to establish care. Pt could not understand why she was being considered new pt when seen in our office in 08/2020.Marland Kitchenapologized to pt for the inconvenience because pt stated she wanted physical appt and this could have been prevented if scheduler would have listened to her needs. I reassured pt that she can come in office for 6/15 appt

## 2021-05-24 NOTE — Progress Notes (Signed)
Patient ID: Melanie Vincent, female    DOB: [birth year]  MRN: [id number]  CC: Annual Physical Exam  Subjective: Melanie Vincent is a 45 y.o. female who presents for annual physical exam.   Her concerns today include:   GERD: Reports does not always take Omeprazole daily. Does feel acid reflux sometimes in the throat area. Reports had a colonoscopy and EGD in the past and was normal at that time. Requesting refills of Omeprazole.   2. HERPES FOLLOW-UP: Not having an outbreak currently. Reports outbreaks are usually spontaneous. 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 Sun and stress can be triggers. Requesting refills of Valacyclovir.   3. MUSCLE RELAXANT FOLLOW-UP: History of DDD lumbar spine. Received spine injections at [facility] in the past. Requesting refills of Cyclobenzaprine.    4. STOMACH AND CHEST DISCOMFORT: Ongoing for at least 1 month. Recently May 2022 at the [facility] for what the patient thought may have been a heart attack at the time. All lab work at that time was normal. Patient was advised to follow-up with Cardiology and return precautions discussed.   Today reports since hospital discharge has not followed up with Cardiology. Reports usually follow-up with them once a year. Reports has been seen at the atrial fibrillation clinic in the past. Concern for lingering symptoms of abdominal bloating and pain in mid-stomach area. Feels like something moving in stomach radiates to chest causing mild pain at times.  5. URINARY SYMPTOMS: Endorses vaginal odor. Would like testing.  Patient Active Problem List   Diagnosis Date Noted   Lumbar degenerative disc disease 05/25/2021   Educated about COVID-19 virus infection 06/13/2020   Precordial chest pain 06/13/2020   Sleep apnea suspected 10/06/2015   Anxiety reaction 09/15/2015   PAF (paroxysmal atrial fibrillation) (HCC) 11/25/2014   Herpes labialis 07/29/2013   Well woman exam (no  gynecological exam) 05/02/2013   GERD (gastroesophageal reflux disease) 11/13/2012   Overweight 06/14/2012     Current Outpatient Medications on File Prior to Visit  Medication Sig Dispense Refill   diltiazem (CARDIZEM) 30 MG tablet Take 1 tablet every 4 hours AS NEEDED for heart rate >100 (Patient taking differently: Take 30 mg by mouth See admin instructions. Take 1 tablet every 4 hours AS NEEDED for heart rate >100) 30 tablet 3   flecainide (TAMBOCOR) 150 MG tablet Take 1-2 tablets by mouth once for afib can only use every 4 days (Patient taking differently: Take 150-300 mg by mouth See admin instructions. Take 1-2 tablets by mouth once for afib can only use every 4 days) 6 tablet 1   meloxicam (MOBIC) 15 MG tablet Take 1 tablet (15 mg total) by mouth daily. (Patient not taking: Reported on 05/24/2021) 30 tablet 1   No current facility-administered medications on file prior to visit.    No Active Allergies  Social History   Socioeconomic History   Marital status: Single    Spouse name: Not on file   Number of children: 4   Years of education: Not on file   Highest education level: Not on file  Occupational History   Occupation: Caregiver  Tobacco Use   Smoking status: Former    Pack years: 0.00    Types: Cigarettes    Quit date: 06/15/2007    Years since quitting: 13.9   Smokeless tobacco: Never  Vaping Use   Vaping Use: Never used  Substance and Sexual Activity   Alcohol use: Yes    Alcohol/week:  4.0 standard drinks    Types: 2 Glasses of wine, 2 Standard drinks or equivalent per week    Comment: 3 drinks per week   Drug use: Yes    Types: Marijuana    Comment: "quit months ago"   Sexual activity: Yes    Birth control/protection: Surgical, Condom  Other Topics Concern   Not on file  Social History Narrative   Lives in [place] with 3 children and has two others.     Single.   Works as a Clinical biochemist (travel)   Social Determinants of Research scientist (physical sciences) Strain: Not on Ship broker Insecurity: Not on file  Transportation Needs: Not on file  Physical Activity: Not on file  Stress: Not on file  Social Connections: Not on file  Intimate Partner Violence: Not on file    Family History  Problem Relation Age of Onset   Diabetes Father    Hypertension Father    Stroke Father    Colon polyps Father    Breast cancer Half-Sister 40   Diabetes Paternal Grandmother    Heart disease Paternal Grandmother    Diabetes Paternal Grandfather    Heart disease Paternal Grandfather    Colon cancer Paternal Aunt        late 70's   Breast cancer Maternal Aunt    Esophageal cancer Neg Hx    Rectal cancer Neg Hx    Stomach cancer Neg Hx     Past Surgical History:  Procedure Laterality Date   CESAREAN SECTION     TUBAL LIGATION     2008    ROS: Review of Systems Negative except as stated above  PHYSICAL EXAM: BP 114/77 (BP Location: Left Arm, Patient Position: Sitting, Cuff Size: Large)   Pulse 79   Temp 98.1 F (36.7 C) (Temporal)   Resp 15   Ht 5' 6.53" (1.69 m)   Wt 205 lb 6.4 oz (93.2 kg)   SpO2 100%   BMI 32.63 kg/m   Physical Exam General appearance - alert, well appearing, and in no distress and oriented to person, place, and time Mental status - alert, oriented to person, place, and time, normal mood, behavior, speech, dress, motor activity, and thought processes Eyes - pupils equal and reactive, extraocular eye movements intact Neck - supple, no significant adenopathy Lymphatics - no palpable lymphadenopathy, no hepatosplenomegaly Chest - clear to auscultation, no wheezes, rales or rhonchi, symmetric air entry, no tachypnea, retractions or cyanosis Heart - normal rate, regular rhythm, normal S1, S2, no murmurs, rubs, clicks or gallops Abdomen - soft, nondistended, no masses or organomegaly, tenderness noted hypogastrium  Breasts - patient declines to have breast exam Pelvic - exam declined by the patient Back exam -  full range of motion, no tenderness, palpable spasm or pain on motion Neurological - alert, oriented, normal speech, no focal findings or movement disorder noted, neck supple without rigidity, cranial nerves II through XII intact, motor and sensory grossly normal bilaterally, normal muscle tone, no tremors, strength 5/5 Musculoskeletal - no joint tenderness, deformity or swelling, no muscular tenderness noted Extremities - peripheral pulses normal Skin - normal coloration and turgor, no rashes, no suspicious skin lesions noted  Results for orders placed or performed in visit on 05/25/21  POCT URINALYSIS DIP (CLINITEK)  Result Value Ref Range   Color, UA yellow yellow   Clarity, UA clear clear   Glucose, UA negative negative mg/dL   Bilirubin, UA negative negative   Ketones, POC  UA negative negative mg/dL   Spec Grav, UA >=4.782>=1.030 (A) 1.010 - 1.025   Blood, UA negative negative   pH, UA 6.0 5.0 - 8.0   POC PROTEIN,UA negative negative, trace   Urobilinogen, UA 0.2 0.2 or 1.0 E.U./dL   Nitrite, UA Negative Negative   Leukocytes, UA Negative Negative    ASSESSMENT AND PLAN: 1. Annual physical exam: - Counseled on 150 minutes of exercise per week as tolerated, healthy eating (including decreased daily intake of saturated fats, cholesterol, added sugars, sodium), STI prevention, and routine healthcare maintenance.  2. Screening for metabolic disorder: - BMP last obtained 04/11/2021. - Hepatic Function Panel to check liver function.  - Hepatic Function Panel  3. Screening for deficiency anemia: - CBC last obtained 04/11/2021.  4. Diabetes mellitus screening: - Hemoglobin A1c to screen for pre-diabetes/diabetes. - Hemoglobin A1c  5. Screening cholesterol level: - Lipid panel to screen for high cholesterol.  - Lipid panel  6. Thyroid disorder screen: - TSH to check thyroid function.  - TSH  7. Gastroesophageal reflux disease, unspecified whether esophagitis present: - Continue  Omeprazole as prescribed.  - Referral to Gastroenterology for further evaluation and management.  - Follow-up with primary provider as scheduled.  - Ambulatory referral to Gastroenterology - omeprazole (PRILOSEC) 20 MG capsule; Take 1 capsule (20 mg total) by mouth daily as needed (acid reflux).  Dispense: 90 capsule; Refill: 0  8. Abdominal discomfort: - Ultrasound of abdomen for further evaluation.  - Referral to Gastroenterology for further evaluation and management.  - US Abdomen Complete; Future - Ambulatory referral to Gastroenterology  9. Chest discomfort: - Diagnostic chest x-ray for further evaluation.  - DG Chest 2 View; Future  10. Herpes labialis: - Continue Valacyclovir as prescribed.  - Follow-up with primary provider as scheduled.  - valACYclovir (VALTREX) 1000 MG tablet; Take 1 tablet (1,000 mg total) by mouth 2 (two) times daily as needed (for cold sores).  Dispense: 30 tablet; Refill: 1  11. Lumbar degenerative disc disease: - Continue Cyclobenzaprine as prescribed. May cause drowsiness. Counseled patient to not consume if operating heavy machinery or driving. Counseled patient to not consume with alcohol or illicit substances.   - Follow-up with primary provider as scheduled. - cyclobenzaprine (FLEXERIL) 10 MG tablet; Take 1 tablet (10 mg total) by mouth 3 (three) times daily as needed for muscle spasms.  Dispense: 90 tablet; Refill: 1  12. Screening for STDs (sexually transmitted diseases): 13. Vaginal odor: - Cervicovaginal self-swab to screen for chlamydia, gonorrhea, trichomonas, bacterial vaginitis, and candida vaginitis. - Cervicovaginal ancillary only  14. Urinary frequency: - Urinalysis negative for urinary tract infection.  - POCT URINALYSIS DIP (CLINITEK)    Patient was given the opportunity to ask questions.  Patient verbalized understanding of the plan and was able to repeat key elements of the plan. Patient was given clear instructions to go to  Emergency Department or return to medical center if symptoms don't improve, worsen, or new problems develop.The patient verbalized understanding.   Orders Placed This Encounter  Procedures   US Abdomen Complete   DG Chest 2 View   Hepatic Function Panel   Lipid panel   TSH   Hemoglobin A1c   Ambulatory referral to Gastroenterology   POCT URINALYSIS DIP (CLINITEK)     Requested Prescriptions   Signed Prescriptions Disp Refills   cyclobenzaprine (FLEXERIL) 10 MG tablet 90 tablet 1    Sig: Take 1 tablet (10 mg total) by mouth 3 (three) times daily as needed  for muscle spasms.   valACYclovir (VALTREX) 1000 MG tablet 30 tablet 1    Sig: Take 1 tablet (1,000 mg total) by mouth 2 (two) times daily as needed (for cold sores).   omeprazole (PRILOSEC) 20 MG capsule 90 capsule 0    Sig: Take 1 capsule (20 mg total) by mouth daily as needed (acid reflux).    Follow-up with primary provider as scheduled.   Rema Fendt, NP

## 2021-05-24 NOTE — Progress Notes (Deleted)
Pt presents for bloating and pain in stomach feels like something moving in stomach radiates to chest causing mild pain at times, states that she has gurgley feeling in chest other symptoms include fatigue and weakness  Pt found tick approx 3 weeks ago not sure if this has anything to do with it  Pt needs refills on omeprazole, cyclobenzaprine, and valacyclovir  

## 2021-05-25 ENCOUNTER — Other Ambulatory Visit: Payer: Self-pay

## 2021-05-25 ENCOUNTER — Encounter: Payer: Self-pay | Admitting: Family

## 2021-05-25 ENCOUNTER — Ambulatory Visit (INDEPENDENT_AMBULATORY_CARE_PROVIDER_SITE_OTHER): Payer: Medicaid Other | Admitting: Family

## 2021-05-25 ENCOUNTER — Ambulatory Visit (INDEPENDENT_AMBULATORY_CARE_PROVIDER_SITE_OTHER): Payer: Medicaid Other

## 2021-05-25 ENCOUNTER — Other Ambulatory Visit (HOSPITAL_COMMUNITY)
Admission: RE | Admit: 2021-05-25 | Discharge: 2021-05-25 | Disposition: A | Discharge status: Home / Self Care | Payer: Medicaid Other | Source: Ambulatory Visit | Attending: Family | Admitting: Family

## 2021-05-25 VITALS — BP 114/77 | HR 79 | Temp 98.1°F | Resp 15 | Ht 66.53 in | Wt 205.4 lb

## 2021-05-25 DIAGNOSIS — Z0000 Encounter for general adult medical examination without abnormal findings: Secondary | ICD-10-CM | POA: Diagnosis not present

## 2021-05-25 DIAGNOSIS — Z130 Encounter for screening for diseases of the blood and blood-forming organs and certain disorders involving the immune mechanism: Secondary | ICD-10-CM

## 2021-05-25 DIAGNOSIS — R109 Unspecified abdominal pain: Secondary | ICD-10-CM

## 2021-05-25 DIAGNOSIS — N898 Other specified noninflammatory disorders of vagina: Secondary | ICD-10-CM

## 2021-05-25 DIAGNOSIS — Z131 Encounter for screening for diabetes mellitus: Secondary | ICD-10-CM

## 2021-05-25 DIAGNOSIS — Z1329 Encounter for screening for other suspected endocrine disorder: Secondary | ICD-10-CM

## 2021-05-25 DIAGNOSIS — M51369 Other intervertebral disc degeneration, lumbar region without mention of lumbar back pain or lower extremity pain: Secondary | ICD-10-CM

## 2021-05-25 DIAGNOSIS — B001 Herpesviral vesicular dermatitis: Secondary | ICD-10-CM

## 2021-05-25 DIAGNOSIS — Z13228 Encounter for screening for other metabolic disorders: Secondary | ICD-10-CM | POA: Diagnosis not present

## 2021-05-25 DIAGNOSIS — R0789 Other chest pain: Secondary | ICD-10-CM | POA: Diagnosis not present

## 2021-05-25 DIAGNOSIS — Z113 Encounter for screening for infections with a predominantly sexual mode of transmission: Secondary | ICD-10-CM

## 2021-05-25 DIAGNOSIS — Z13220 Encounter for screening for lipoid disorders: Secondary | ICD-10-CM

## 2021-05-25 DIAGNOSIS — M5136 Other intervertebral disc degeneration, lumbar region: Secondary | ICD-10-CM | POA: Insufficient documentation

## 2021-05-25 DIAGNOSIS — R350 Frequency of micturition: Secondary | ICD-10-CM | POA: Diagnosis not present

## 2021-05-25 DIAGNOSIS — K219 Gastro-esophageal reflux disease without esophagitis: Secondary | ICD-10-CM

## 2021-05-25 LAB — POCT URINALYSIS DIP (CLINITEK)
Bilirubin, UA: NEGATIVE
Blood, UA: NEGATIVE
Glucose, UA: NEGATIVE mg/dL
Ketones, POC UA: NEGATIVE mg/dL
Leukocytes, UA: NEGATIVE
Nitrite, UA: NEGATIVE
POC PROTEIN,UA: NEGATIVE
Spec Grav, UA: >=1.03 — AB (ref 1.010–1.025)
Urobilinogen, UA: 0.2 E.U./dL
pH, UA: 6 (ref 5.0–8.0)

## 2021-05-25 MED ORDER — VALACYCLOVIR HCL 1 G PO TABS
1000.0000 mg | ORAL_TABLET | Freq: Two times a day (BID) | ORAL | 1 refills | Status: DC | PRN
  Filled 2021-05-25: qty 30, 15d supply, fill #0

## 2021-05-25 MED ORDER — CYCLOBENZAPRINE HCL 10 MG PO TABS
10.0000 mg | ORAL_TABLET | Freq: Three times a day (TID) | ORAL | 1 refills | Status: DC | PRN
  Filled 2021-05-25 – 2022-01-27 (×3): qty 90, 30d supply, fill #0

## 2021-05-25 MED ORDER — OMEPRAZOLE 20 MG PO CPDR
20.0000 mg | DELAYED_RELEASE_CAPSULE | Freq: Every day | ORAL | 0 refills | Status: DC | PRN
  Filled 2021-05-25 – 2021-08-11 (×2): qty 90, 90d supply, fill #0

## 2021-05-25 NOTE — Progress Notes (Signed)
Chest x-ray normal

## 2021-05-25 NOTE — Patient Instructions (Signed)
Preventive Care 68-45 Years Old, Female Preventive care refers to lifestyle choices and visits with your health care provider that can promote health and wellness. This includes: A yearly physical exam. This is also called an annual wellness visit. Regular dental and eye exams. Immunizations. Screening for certain conditions. Healthy lifestyle choices, such as: Eating a healthy diet. Getting regular exercise. Not using drugs or products that contain nicotine and tobacco. Limiting alcohol use. What can I expect for my preventive care visit? Physical exam Your health care provider will check your: Height and weight. These may be used to calculate your BMI (body mass index). BMI is a measurement that tells if you are at a healthy weight. Heart rate and blood pressure. Body temperature. Skin for abnormal spots. Counseling Your health care provider may ask you questions about your: Past medical problems. Family's medical history. Alcohol, tobacco, and drug use. Emotional well-being. Home life and relationship well-being. Sexual activity. Diet, exercise, and sleep habits. Work and work Statistician. Access to firearms. Method of birth control. Menstrual cycle. Pregnancy history. What immunizations do I need?  Vaccines are usually given at various ages, according to a schedule. Your health care provider will recommend vaccines for you based on your age, medicalhistory, and lifestyle or other factors, such as travel or where you work. What tests do I need? Blood tests Lipid and cholesterol levels. These may be checked every 5 years, or more often if you are over 37 years old. Hepatitis C test. Hepatitis B test. Screening Lung cancer screening. You may have this screening every year starting at age 30 if you have a 30-pack-year history of smoking and currently smoke or have quit within the past 15 years. Colorectal cancer screening. All adults should have this screening starting at  age 45 and continuing until age 3. Your health care provider may recommend screening at age 88 if you are at increased risk. You will have tests every 1-10 years, depending on your results and the type of screening test. Diabetes screening. This is done by checking your blood sugar (glucose) after you have not eaten for a while (fasting). You may have this done every 1-3 years. Mammogram. This may be done every 1-2 years. Talk with your health care provider about when you should start having regular mammograms. This may depend on whether you have a family history of breast cancer. BRCA-related cancer screening. This may be done if you have a family history of breast, ovarian, tubal, or peritoneal cancers. Pelvic exam and Pap test. This may be done every 3 years starting at age 79. Starting at age 45, this may be done every 5 years if you have a Pap test in combination with an HPV test. Other tests STD (sexually transmitted disease) testing, if you are at risk. Bone density scan. This is done to screen for osteoporosis. You may have this scan if you are at high risk for osteoporosis. Talk with your health care provider about your test results, treatment options,and if necessary, the need for more tests. Follow these instructions at home: Eating and drinking  Eat a diet that includes fresh fruits and vegetables, whole grains, lean protein, and low-fat dairy products. Take vitamin and mineral supplements as recommended by your health care provider. Do not drink alcohol if: Your health care provider tells you not to drink. You are pregnant, may be pregnant, or are planning to become pregnant. If you drink alcohol: Limit how much you have to 0-1 drink a day. Be aware  of how much alcohol is in your drink. In the [place] one drink equals one 12 oz bottle of beer (355 mL), one 5 oz glass of wine (148 mL), or one 1 oz glass of hard liquor (44 mL).  Lifestyle Take daily care of your teeth and  gums. Brush your teeth every morning and night with fluoride toothpaste. Floss one time each day. Stay active. Exercise for at least 30 minutes 5 or more days each week. Do not use any products that contain nicotine or tobacco, such as cigarettes, e-cigarettes, and chewing tobacco. If you need help quitting, ask your health care provider. Do not use drugs. If you are sexually active, practice safe sex. Use a condom or other form of protection to prevent STIs (sexually transmitted infections). If you do not wish to become pregnant, use a form of birth control. If you plan to become pregnant, see your health care provider for a prepregnancy visit. If told by your health care provider, take low-dose aspirin daily starting at age 29. Find healthy ways to cope with stress, such as: Meditation, yoga, or listening to music. Journaling. Talking to a trusted person. Spending time with friends and family. Safety Always wear your seat belt while driving or riding in a vehicle. Do not drive: If you have been drinking alcohol. Do not ride with someone who has been drinking. When you are tired or distracted. While texting. Wear a helmet and other protective equipment during sports activities. If you have firearms in your house, make sure you follow all gun safety procedures. What's next? Visit your health care provider once a year for an annual wellness visit. Ask your health care provider how often you should have your eyes and teeth checked. Stay up to date on all vaccines. This information is not intended to replace advice given to you by your health care provider. Make sure you discuss any questions you have with your healthcare provider. Document Revised: 08/31/2020 Document Reviewed: 08/08/2018 Elsevier Patient Education  2022 [employer].

## 2021-05-25 NOTE — Progress Notes (Signed)
Pt present for annual physical exam and  Experiencing bloating and pain in stomach feels like something moving in stomach radiates to chest causing mild pain at times, states that she has gurgley feeling in chest other symptoms include fatigue and weakness Pt needs refills on omeprazole, cyclobenzaprine, and valacyclovir

## 2021-05-26 ENCOUNTER — Encounter: Payer: Self-pay | Admitting: Family

## 2021-05-26 ENCOUNTER — Telehealth: Payer: Self-pay | Admitting: Family

## 2021-05-26 ENCOUNTER — Other Ambulatory Visit: Payer: Self-pay | Admitting: Family

## 2021-05-26 DIAGNOSIS — N760 Acute vaginitis: Secondary | ICD-10-CM

## 2021-05-26 LAB — CERVICOVAGINAL ANCILLARY ONLY
Bacterial Vaginitis (gardnerella): POSITIVE — AB
Candida Glabrata: NEGATIVE
Candida Vaginitis: NEGATIVE
Chlamydia: NEGATIVE
Comment: NEGATIVE
Comment: NEGATIVE
Comment: NEGATIVE
Comment: NEGATIVE
Comment: NEGATIVE
Comment: NORMAL
Neisseria Gonorrhea: NEGATIVE
Trichomonas: NEGATIVE

## 2021-05-26 LAB — LIPID PANEL
Chol/HDL Ratio: 3.8 ratio (ref 0.0–4.4)
Cholesterol, Total: 214 mg/dL — ABNORMAL HIGH (ref 100–199)
HDL: 56 mg/dL (ref >39)
LDL Chol Calc (NIH): 146 mg/dL — ABNORMAL HIGH (ref 0–99)
Triglycerides: 65 mg/dL (ref 0–149)
VLDL Cholesterol Cal: 12 mg/dL (ref 5–40)

## 2021-05-26 LAB — HEPATIC FUNCTION PANEL
ALT: 19 IU/L (ref 0–32)
AST: 15 IU/L (ref 0–40)
Albumin: 4.9 g/dL — ABNORMAL HIGH (ref 3.8–4.8)
Alkaline Phosphatase: 55 IU/L (ref 44–121)
Bilirubin Total: 0.2 mg/dL (ref 0.0–1.2)
Bilirubin, Direct: <0.1 mg/dL (ref 0.00–0.40)
Total Protein: 7.3 g/dL (ref 6.0–8.5)

## 2021-05-26 LAB — HEMOGLOBIN A1C
Est. average glucose Bld gHb Est-mCnc: 117 mg/dL
Hgb A1c MFr Bld: 5.7 % — ABNORMAL HIGH (ref 4.8–5.6)

## 2021-05-26 LAB — TSH: TSH: 0.492 u[IU]/mL (ref 0.450–4.500)

## 2021-05-26 MED ORDER — METRONIDAZOLE 500 MG PO TABS
500.0000 mg | ORAL_TABLET | Freq: Two times a day (BID) | ORAL | 0 refills | Status: DC
  Filled 2021-05-26: qty 14, 7d supply, fill #0

## 2021-05-26 NOTE — Telephone Encounter (Signed)
If we can give patient the contact information to the Infusion Clinic please.   Report to Emergency Department or Urgent Care if symptoms worsen or new problems develop.

## 2021-05-26 NOTE — Telephone Encounter (Signed)
Pt called in stating she tested positive for COVID 6.15.2022 and is asking for medication that can be given within the first 48 hours. Pt would also like a call back to discuss lab results from her visit on 6.15.2022.

## 2021-05-26 NOTE — Progress Notes (Signed)
Gonorrhea, Chlamydia, Trichomonas, and Yeast Infection negative.  Positive for Bacterial Vaginitis, an overgrowth of normal bacteria in the vagina due to changes in pH often related to semen, menstrual periods, or soaps. Prescribed (Metronidazole) Flagyl twice per day for 7 days. Do not drink alcohol while taking this medication.  

## 2021-05-26 NOTE — Progress Notes (Signed)
Urinalysis discussed in office.   Liver function normal.   Thyroid function normal.   Hemoglobin A1c is consistent with pre-diabetes. Practice healthy eating habits of fresh fruit and vegetables, lean baked meats such as chicken, fish, and [place]; limit breads, rice, pastas, and desserts; practice regular aerobic exercise (at least 150 minutes a week as tolerated). Encouraged to recheck in 6 months.   Cholesterol higher than expected. High cholesterol may increase risk of heart attack and/or stroke. Consider eating more fruits, vegetables, and lean baked meats such as chicken or fish. Moderate intensity exercise at least 150 minutes as tolerated per week may help as well. Encouraged to recheck in 3 to 6 months.

## 2021-05-27 ENCOUNTER — Other Ambulatory Visit: Payer: Self-pay | Admitting: Family

## 2021-05-27 ENCOUNTER — Other Ambulatory Visit: Payer: Self-pay

## 2021-05-27 ENCOUNTER — Encounter: Payer: Self-pay | Admitting: Family

## 2021-05-27 DIAGNOSIS — N760 Acute vaginitis: Secondary | ICD-10-CM

## 2021-05-27 DIAGNOSIS — U071 COVID-19: Secondary | ICD-10-CM

## 2021-05-27 DIAGNOSIS — B9689 Other specified bacterial agents as the cause of diseases classified elsewhere: Secondary | ICD-10-CM

## 2021-05-27 MED ORDER — NIRMATRELVIR/RITONAVIR (PAXLOVID)TABLET: 3.0000 | ORAL_TABLET | Freq: Two times a day (BID) | ORAL | 0 refills | Status: DC

## 2021-05-27 MED ORDER — METRONIDAZOLE 0.75 % VA GEL: 1.0000 | Freq: Every day | VAGINAL | 0 refills | Status: AC

## 2021-05-27 MED ORDER — METRONIDAZOLE 0.75 % VA GEL
1.0000 | Freq: Every day | VAGINAL | 0 refills | Status: DC
  Filled 2021-05-27: qty 70, 7d supply, fill #0

## 2021-05-27 MED ORDER — NIRMATRELVIR/RITONAVIR (PAXLOVID)TABLET: 3.0000 | ORAL_TABLET | Freq: Two times a day (BID) | ORAL | 0 refills | Status: AC

## 2021-05-27 NOTE — Progress Notes (Signed)
Patient reports she prefers Metronidazole (Flagyl) vaginal gel.   Metronidazole (Flagyl) tablets discontinued per patient preference.

## 2021-05-27 NOTE — Progress Notes (Signed)
Patient sent provider a message on 05/26/2021 stating that she tested positive for Covid.   Nirmatrelvir/Ritonavir (Paxlovid) as prescribed.   Report to Emergency Department or Urgent Care if symptoms don't improve, worsen, or new problems develop.The patient verbalized understanding.

## 2021-06-01 ENCOUNTER — Other Ambulatory Visit: Payer: Self-pay

## 2021-06-02 IMAGING — DX DG CHEST 2V
2 series · 2 of 2 positions shown · non-contrast
Comparison: 04/01/2018

CLINICAL DATA: Chest pain

EXAM:
CHEST - 2 VIEW

[chest pa]
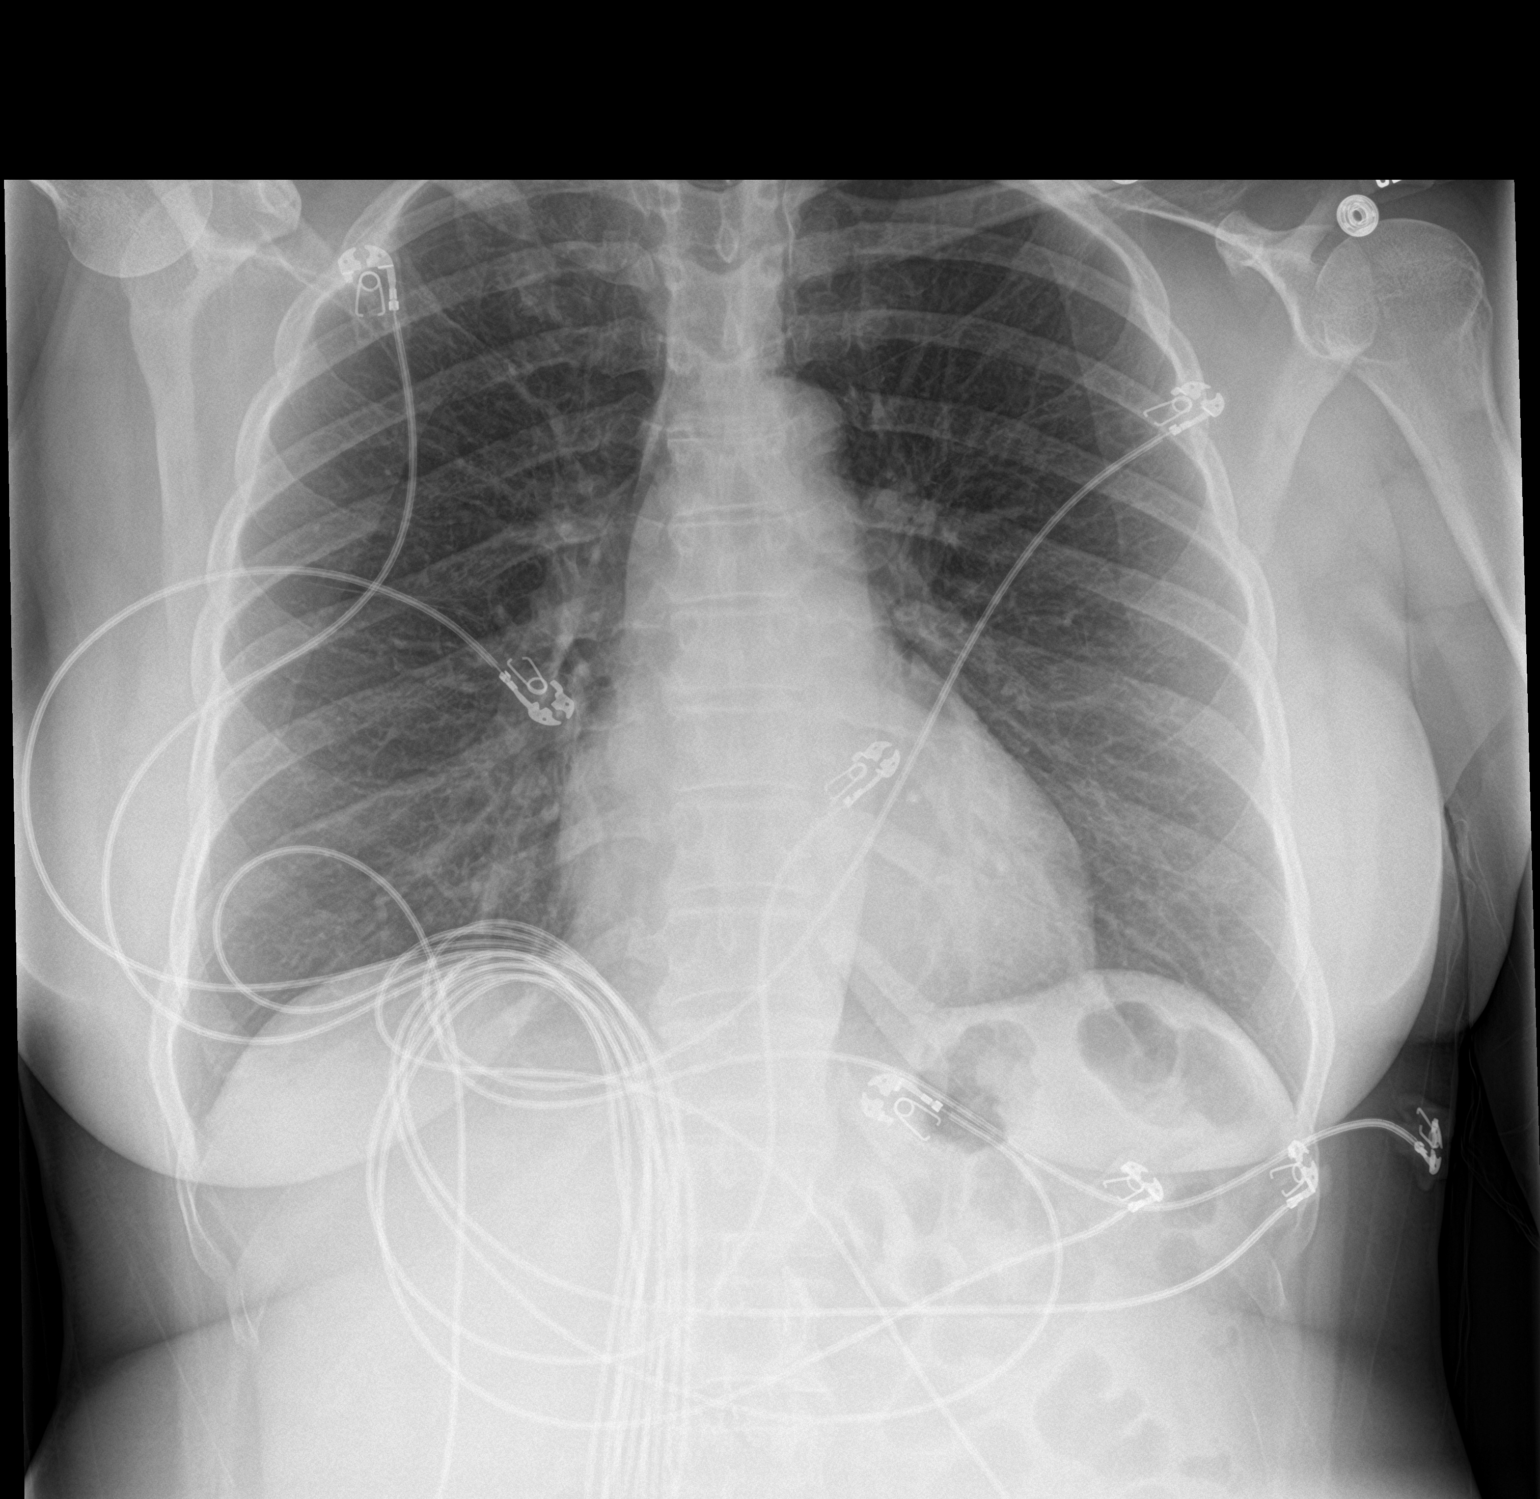

[chest lat]
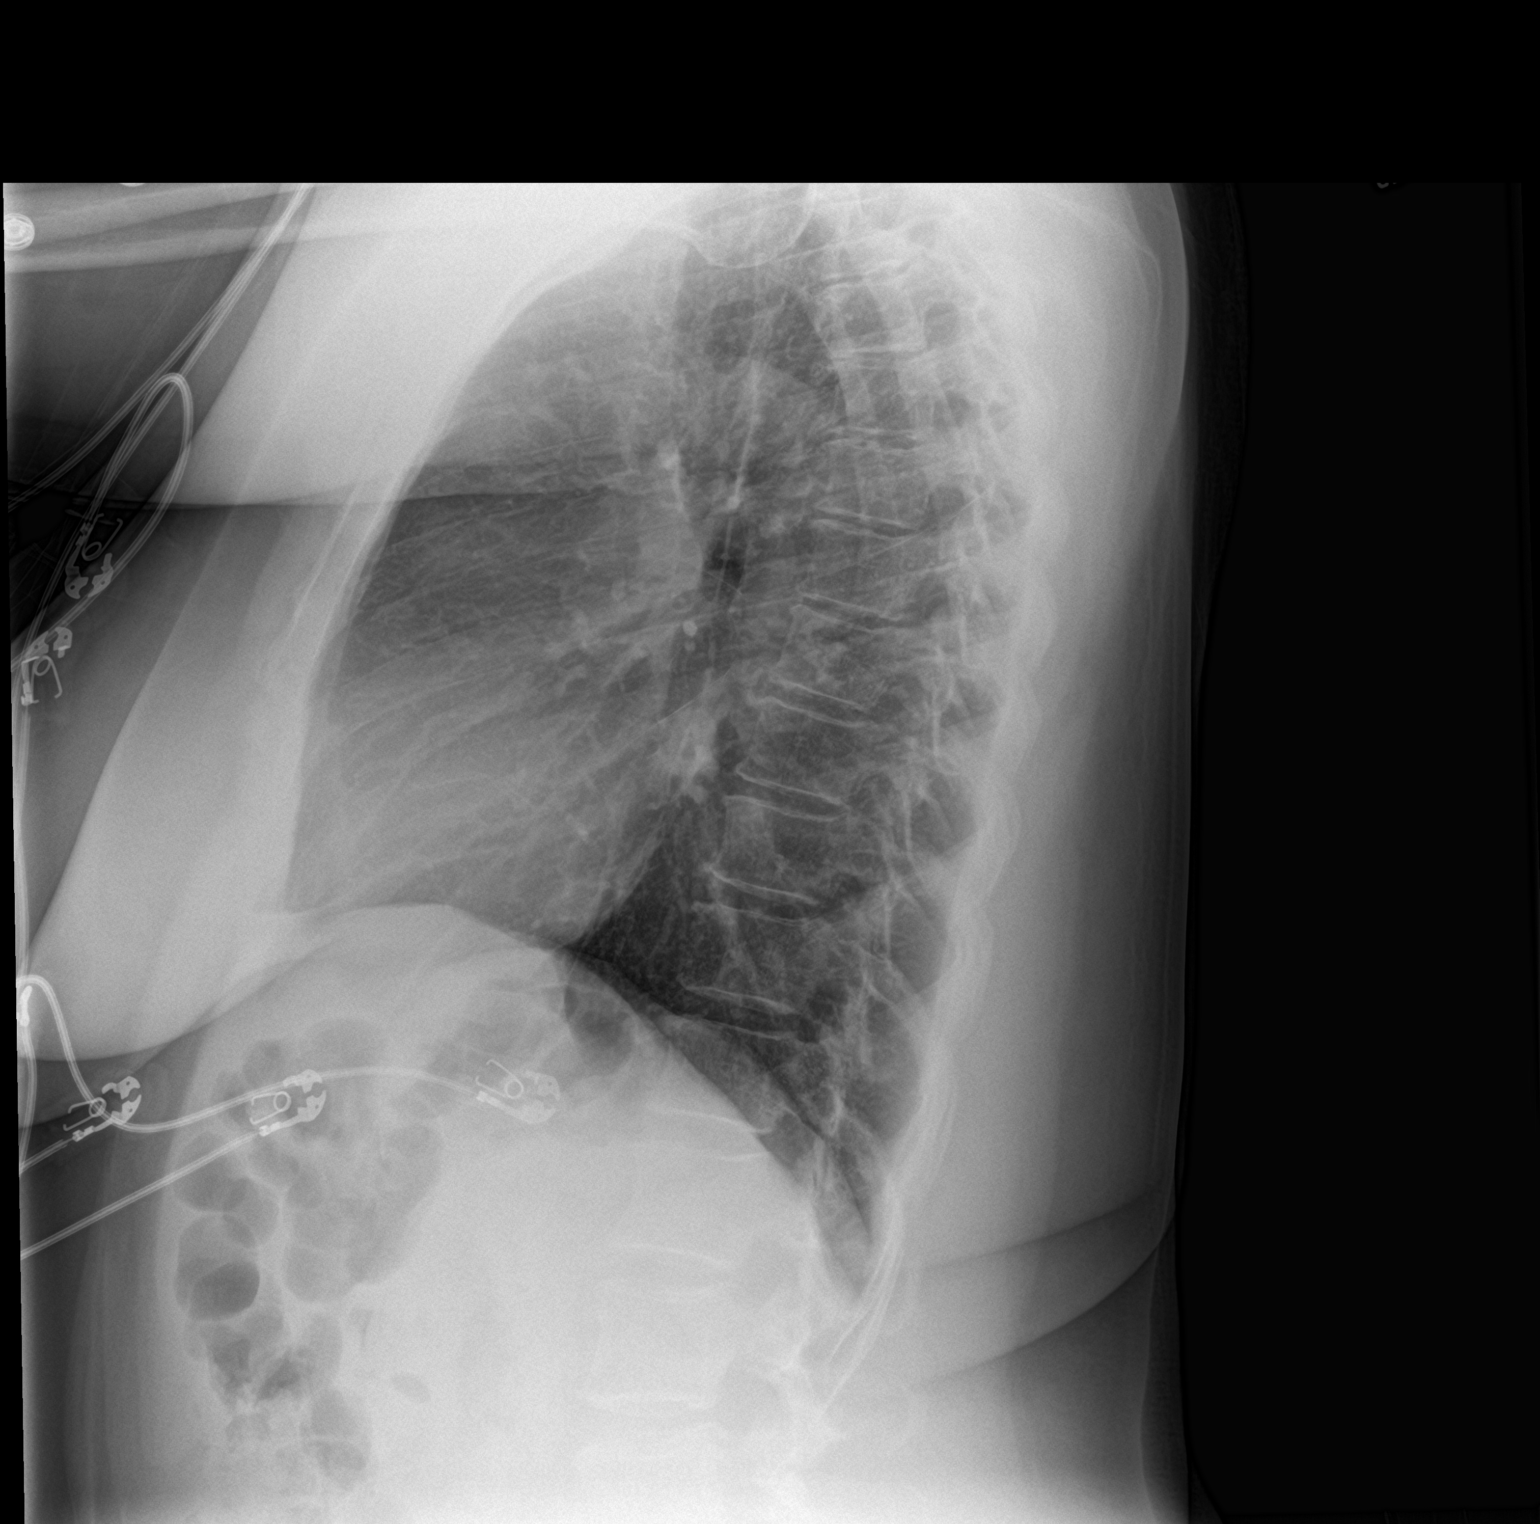

[2 of 2 positions shown; findings below may reference images not displayed]

FINDINGS: The heart size and mediastinal contours are within normal limits.
Both lungs are clear. The visualized skeletal structures are
unremarkable.
IMPRESSION: No active cardiopulmonary disease.

## 2021-06-09 ENCOUNTER — Telehealth: Payer: Self-pay

## 2021-06-09 NOTE — Telephone Encounter (Signed)
Att to contact pt to advise of scheduled ultrasound for 07/1@815 , no ans lvm if pt needs to reschedule appt Scheduling Ph# [phone]

## 2021-06-10 ENCOUNTER — Ambulatory Visit (HOSPITAL_COMMUNITY)
Admission: RE | Admit: 2021-06-10 | Discharge: 2021-06-10 | Disposition: A | Discharge status: Home / Self Care | Payer: Medicaid Other | Source: Ambulatory Visit | Attending: Family | Admitting: Family

## 2021-06-10 ENCOUNTER — Other Ambulatory Visit: Payer: Self-pay

## 2021-06-10 DIAGNOSIS — R109 Unspecified abdominal pain: Secondary | ICD-10-CM | POA: Diagnosis present

## 2021-06-11 NOTE — Progress Notes (Signed)
Normal abdominal ultrasound.

## 2021-06-28 ENCOUNTER — Encounter: Payer: Medicaid Other | Admitting: Family

## 2021-07-15 IMAGING — CR DG CHEST 2V
2 series · 2 of 2 positions shown · non-contrast
Comparison: 03/29/2020

CLINICAL DATA: Chest pain

EXAM:
CHEST - 2 VIEW

[chest lat]
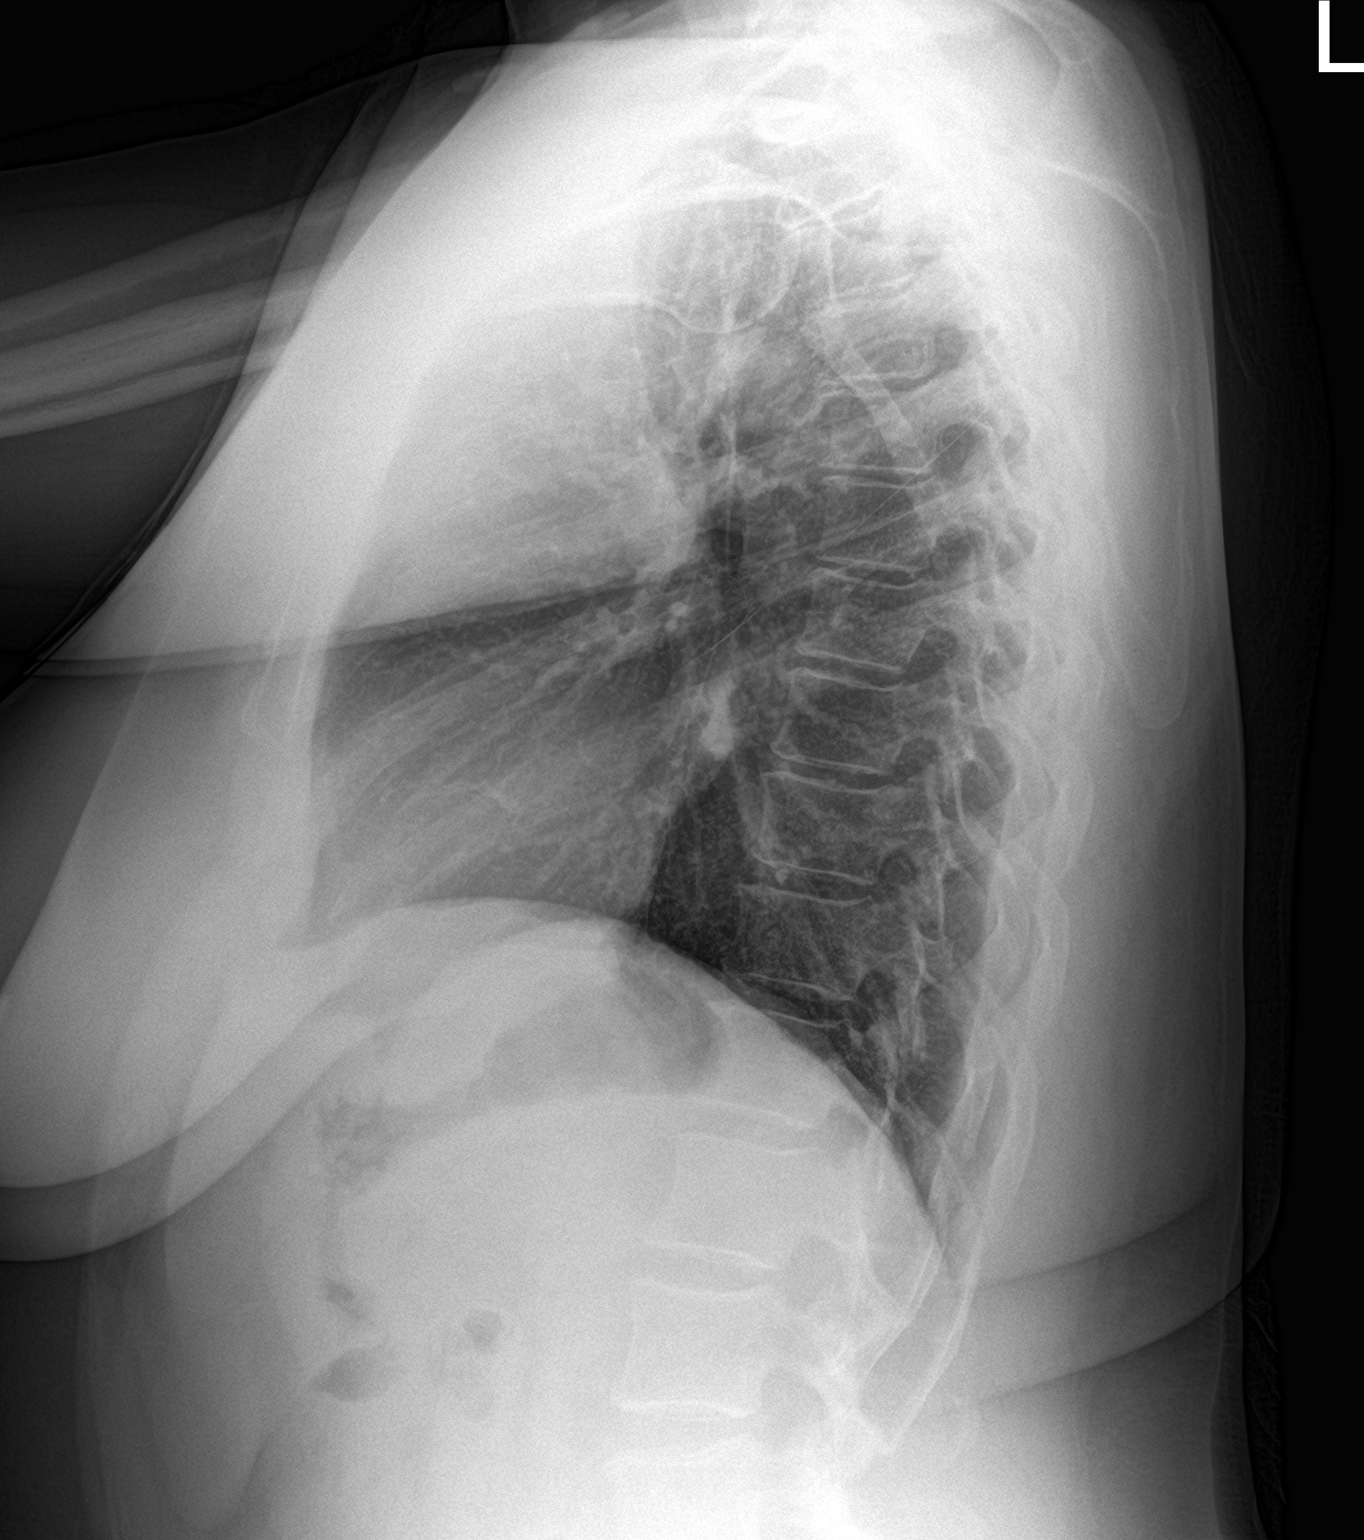

[chest pa]
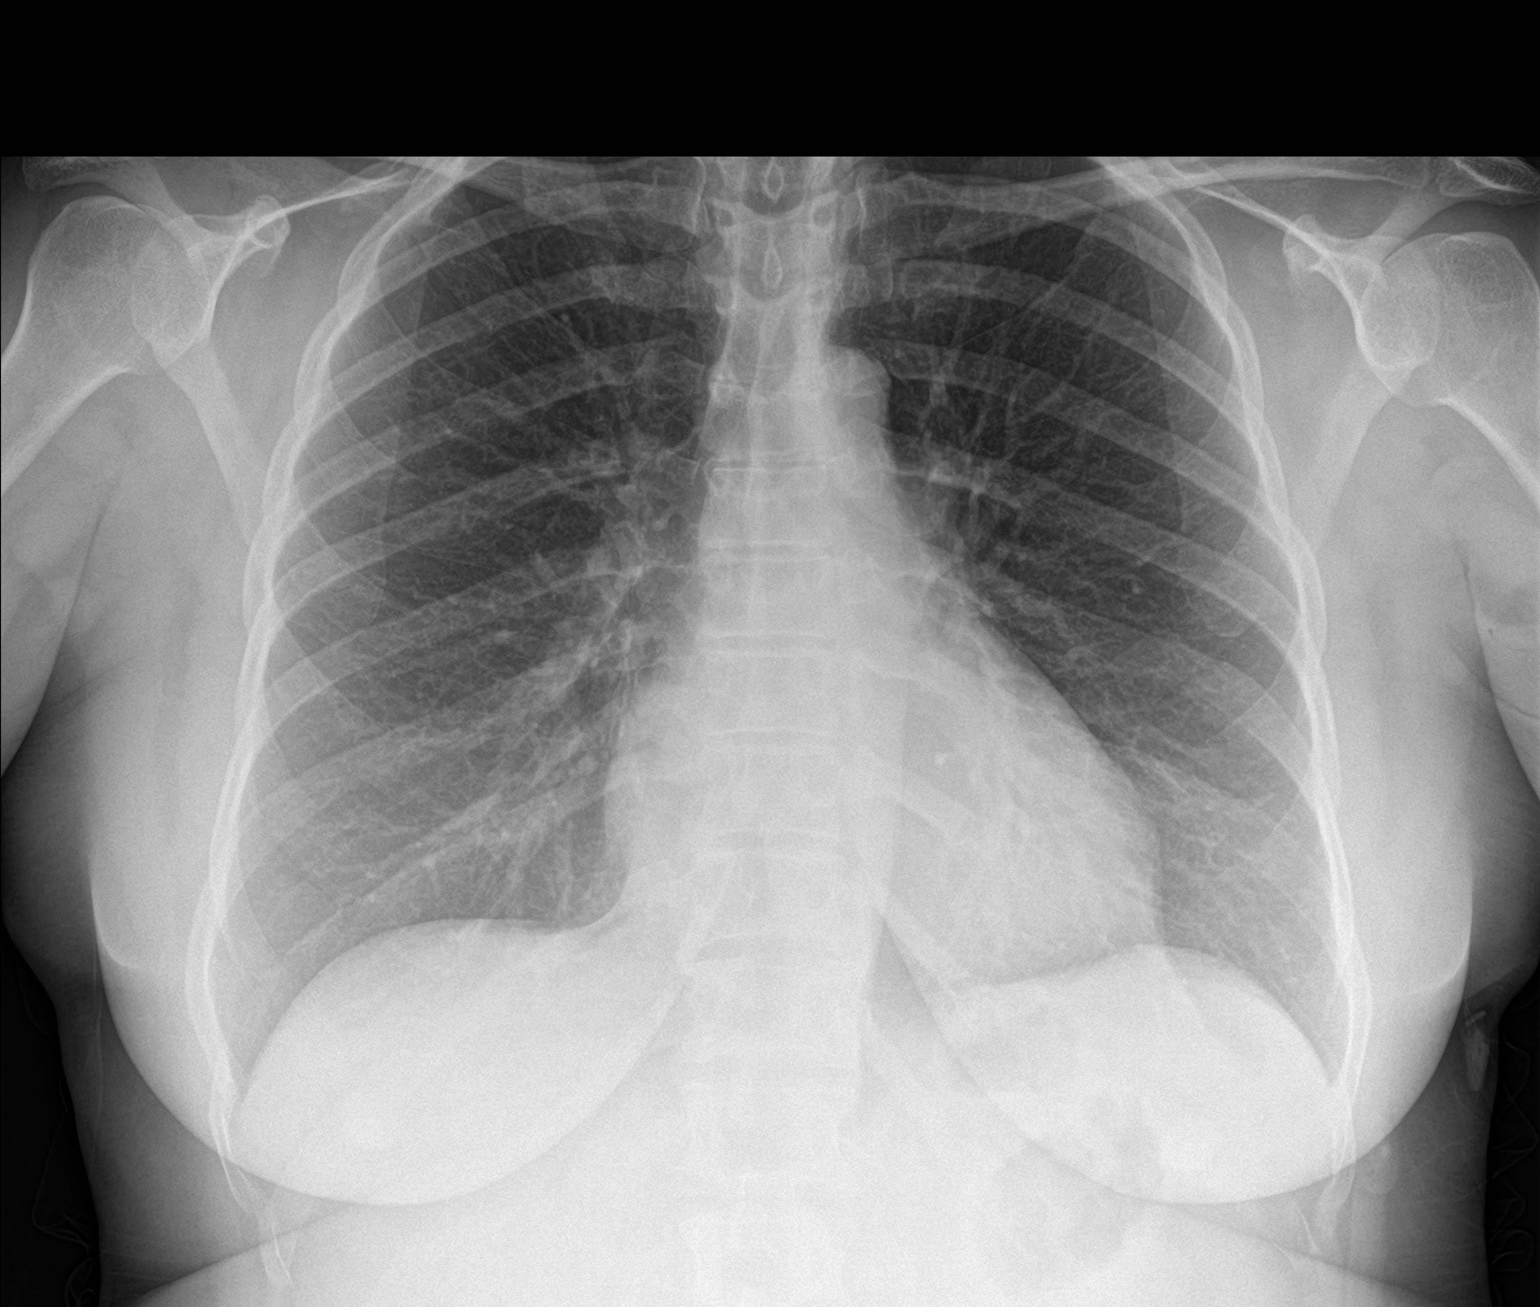

[2 of 2 positions shown; findings below may reference images not displayed]

FINDINGS: The heart size and mediastinal contours are within normal limits.
Both lungs are clear. The visualized skeletal structures are
unremarkable.
IMPRESSION: No active cardiopulmonary disease.

## 2021-07-29 ENCOUNTER — Ambulatory Visit: Payer: Medicaid Other

## 2021-08-01 ENCOUNTER — Other Ambulatory Visit: Payer: Self-pay

## 2021-08-01 ENCOUNTER — Ambulatory Visit (INDEPENDENT_AMBULATORY_CARE_PROVIDER_SITE_OTHER): Payer: Medicaid Other

## 2021-08-01 DIAGNOSIS — Z111 Encounter for screening for respiratory tuberculosis: Secondary | ICD-10-CM

## 2021-08-03 ENCOUNTER — Encounter: Payer: Self-pay | Admitting: Family

## 2021-08-04 LAB — TB SKIN TEST
Induration: 0 mm
TB Skin Test: NEGATIVE

## 2021-08-04 NOTE — Progress Notes (Signed)
Tuberculin skin test applied to right ventral forearm. Explained to patient to return within 48-72 hours for reading and paperwork. Patient verbalized understanding.  

## 2021-08-04 NOTE — Progress Notes (Signed)
TB skin negative.

## 2021-08-06 ENCOUNTER — Telehealth: Payer: Self-pay | Admitting: Student in an Organized Health Care Education/Training Program

## 2021-08-06 NOTE — Telephone Encounter (Signed)
Paged by operator that patient is in AF/SOB. Feels like she is in AF when she is walking. When she is at rest she doesn't notice it as much. Feels like her heart is skipping beats when she is walking. First felt it last night and felt palpitations. Took Cardizem 30 mg PO x1 last night. Felt ok overnight and then when she woke up she felt it again. Has been relaxing throughout the day without issue but when she gets up to move around she feels like she is back in AF. However when she checks pulse she thinks she may be in AF but rate controlled. Seems that she has rhythm awareness when she is in AF/RVR but not as much when rate controlled. Has both flecainide and dilt at home. Not on OAC with C2V (1). AF episodes are very infrequent. Hasn't had recent triggers, etoh is trigger for her but only had 1 glass wine. Usually liquor triggers it. Seems most likely she is in rate controlled AF at rest given hx. Also gets slightly SOB with movement. Advised to take dilt+flecainide in AM and see if improvement. If no improvement need to get her back to clinic to discuss options for AF management.

## 2021-08-11 ENCOUNTER — Other Ambulatory Visit: Payer: Self-pay

## 2021-08-12 ENCOUNTER — Other Ambulatory Visit: Payer: Self-pay

## 2021-08-12 ENCOUNTER — Emergency Department (HOSPITAL_COMMUNITY)
Admission: EM | Admit: 2021-08-12 | Discharge: 2021-08-12 | Discharge status: Against Medical Advice | Payer: Medicaid Other

## 2021-08-12 NOTE — ED Notes (Signed)
No answer for triage, pt not in lobby or bathrooms

## 2021-08-12 NOTE — ED Notes (Signed)
Called patient for triage, no answer

## 2021-08-14 NOTE — Progress Notes (Deleted)
Patient ID: DEA BITTING, female    DOB: 1976-12-06  MRN: [id number]  CC: No chief complaint on file.   Subjective: Melanie Vincent is a 45 y.o. female who presents for Her concerns today include: ***  GERD, referred to GI on 05/25/2021    Patient Active Problem List   Diagnosis Date Noted   Bacterial vaginitis 05/26/2021   Lumbar degenerative disc disease 05/25/2021   Educated about COVID-19 virus infection 06/13/2020   Precordial chest pain 06/13/2020   Sleep apnea suspected 10/06/2015   Anxiety reaction 09/15/2015   PAF (paroxysmal atrial fibrillation) (HCC) 11/25/2014   Herpes labialis 07/29/2013   Well woman exam (no gynecological exam) 05/02/2013   GERD (gastroesophageal reflux disease) 11/13/2012   Overweight 06/14/2012     Current Outpatient Medications on File Prior to Visit  Medication Sig Dispense Refill   cyclobenzaprine (FLEXERIL) 10 MG tablet Take 1 tablet (10 mg total) by mouth 3 (three) times daily as needed for muscle spasms. 90 tablet 1   diltiazem (CARDIZEM) 30 MG tablet Take 1 tablet every 4 hours AS NEEDED for heart rate >100 (Patient taking differently: Take 30 mg by mouth See admin instructions. Take 1 tablet every 4 hours AS NEEDED for heart rate >100) 30 tablet 3   flecainide (TAMBOCOR) 150 MG tablet Take 1-2 tablets by mouth once for afib can only use every 4 days (Patient taking differently: Take 150-300 mg by mouth See admin instructions. Take 1-2 tablets by mouth once for afib can only use every 4 days) 6 tablet 1   meloxicam (MOBIC) 15 MG tablet Take 1 tablet (15 mg total) by mouth daily. (Patient not taking: Reported on 05/24/2021) 30 tablet 1   omeprazole (PRILOSEC) 20 MG capsule Take 1 capsule (20 mg total) by mouth daily as needed (acid reflux). 90 capsule 0   valACYclovir (VALTREX) 1000 MG tablet Take 1 tablet (1,000 mg total) by mouth 2 (two) times daily as needed (for cold sores). 30 tablet 1   No current facility-administered  medications on file prior to visit.    No Active Allergies  Social History   Socioeconomic History   Marital status: Single    Spouse name: Not on file   Number of children: 4   Years of education: Not on file   Highest education level: Not on file  Occupational History   Occupation: Caregiver  Tobacco Use   Smoking status: Former    Types: Cigarettes    Quit date: 06/15/2007    Years since quitting: 14.1   Smokeless tobacco: Never  Vaping Use   Vaping Use: Never used  Substance and Sexual Activity   Alcohol use: Yes    Alcohol/week: 4.0 standard drinks    Types: 2 Glasses of wine, 2 Standard drinks or equivalent per week    Comment: 3 drinks per week   Drug use: Yes    Types: Marijuana    Comment: "quit months ago"   Sexual activity: Yes    Birth control/protection: Surgical, Condom  Other Topics Concern   Not on file  Social History Narrative   Lives in [place] with 3 children and has two others.     Single.   Works as a Clinical biochemist (travel)   Social Determinants of Corporate investment banker Strain: Not on [employer] Insecurity: Not on file  Transportation Needs: Not on file  Physical Activity: Not on file  Stress: Not on file  Social Connections: Not on file  Intimate Partner Violence: Not on file    Family History  Problem Relation Age of Onset   Diabetes Father    Hypertension Father    Stroke Father    Colon polyps Father    Breast cancer Half-Sister 52   Diabetes Paternal Grandmother    Heart disease Paternal Grandmother    Diabetes Paternal Grandfather    Heart disease Paternal Grandfather    Colon cancer Paternal Aunt        late 70's   Breast cancer Maternal Aunt    Esophageal cancer Neg Hx    Rectal cancer Neg Hx    Stomach cancer Neg Hx     Past Surgical History:  Procedure Laterality Date   CESAREAN SECTION     TUBAL LIGATION     2008    ROS: Review of Systems Negative except as stated above  PHYSICAL  EXAM: There were no vitals taken for this visit.  Physical Exam  {female adult master:310786} {female adult master:310785}  CMP Latest Ref Rng & Units 05/25/2021 04/11/2021 08/24/2020  Glucose 70 - 99 mg/dL - 93 79  BUN 6 - 20 mg/dL - 11 15  Creatinine 8.31 - 1.00 mg/dL - 5.17 6.16  Sodium 073 - 145 mmol/L - 137 140  Potassium 3.5 - 5.1 mmol/L - 4.3 4.8  Chloride 98 - 111 mmol/L - 105 104  CO2 22 - 32 mmol/L - 25 23  Calcium 8.9 - 10.3 mg/dL - 9.1 9.8  Total Protein 6.0 - 8.5 g/dL 7.3 - 7.4  Total Bilirubin 0.0 - 1.2 mg/dL 0.2 - 0.2  Alkaline Phos 44 - 121 IU/L 55 - 62  AST 0 - 40 IU/L 15 - 15  ALT 0 - 32 IU/L 19 - 16   Lipid Panel     Component Value Date/Time   CHOL 214 (H) 05/25/2021 1006   TRIG 65 05/25/2021 1006   HDL 56 05/25/2021 1006   CHOLHDL 3.8 05/25/2021 1006   CHOLHDL 3.7 Ratio 06/01/2009 2050   VLDL 12 06/01/2009 2050   LDLCALC 146 (H) 05/25/2021 1006    CBC    Component Value Date/Time   WBC 5.7 04/11/2021 0854   RBC 3.97 04/11/2021 0854   HGB 11.7 (L) 04/11/2021 0854   HGB 12.4 08/24/2020 1202   HCT 36.4 04/11/2021 0854   HCT 38.8 08/24/2020 1202   PLT 382 04/11/2021 0854   PLT 403 08/24/2020 1202   MCV 91.7 04/11/2021 0854   MCV 92 08/24/2020 1202   MCH 29.5 04/11/2021 0854   MCHC 32.1 04/11/2021 0854   RDW 13.4 04/11/2021 0854   RDW 13.2 08/24/2020 1202   LYMPHSABS 2.8 08/24/2020 1202   MONOABS 0.6 12/24/2019 0959   EOSABS 0.1 08/24/2020 1202   BASOSABS 0.0 08/24/2020 1202    ASSESSMENT AND PLAN:  There are no diagnoses linked to this encounter.   Patient was given the opportunity to ask questions.  Patient verbalized understanding of the plan and was able to repeat key elements of the plan. Patient was given clear instructions to go to Emergency Department or return to medical center if symptoms don't improve, worsen, or new problems develop.The patient verbalized understanding.   No orders of the defined types were placed in this  encounter.    Requested Prescriptions    No prescriptions requested or ordered in this encounter    No follow-ups on file.  Rema Fendt, NP

## 2021-08-17 ENCOUNTER — Telehealth: Payer: Self-pay | Admitting: Cardiology

## 2021-08-17 NOTE — Telephone Encounter (Signed)
Returned call to Pt.  She states she has had some feelings in her chest-like a twinge or a burning sensation.  She states it lasts usually only seconds and then resolves but she is concerned.  She states she restarted her omeprazole a few weeks ago with some improvement, but is still concerned that something may be going on with her heart.  She would like an appointment in the coming weeks to be evaluated.  Appt information sent via mychart.  Await further needs.

## 2021-08-17 NOTE — Telephone Encounter (Signed)
   Pt said she's been having heart issues, she went to ED and pcp and everything was check except her heart. She wanted to see Dr. Antoine Poche or APP but first available is ion November. She would like to be seen sooner

## 2021-08-19 ENCOUNTER — Ambulatory Visit: Payer: Medicaid Other | Admitting: Family

## 2021-09-01 ENCOUNTER — Emergency Department (HOSPITAL_COMMUNITY): Payer: Medicaid Other

## 2021-09-01 ENCOUNTER — Emergency Department (HOSPITAL_COMMUNITY)
Admission: EM | Admit: 2021-09-01 | Discharge: 2021-09-01 | Disposition: A | Discharge status: Home / Self Care | Payer: Medicaid Other | Attending: Emergency Medicine | Admitting: Emergency Medicine

## 2021-09-01 ENCOUNTER — Other Ambulatory Visit: Payer: Self-pay

## 2021-09-01 ENCOUNTER — Encounter (HOSPITAL_COMMUNITY): Payer: Self-pay | Admitting: Emergency Medicine

## 2021-09-01 DIAGNOSIS — M79602 Pain in left arm: Secondary | ICD-10-CM | POA: Diagnosis not present

## 2021-09-01 DIAGNOSIS — R079 Chest pain, unspecified: Secondary | ICD-10-CM | POA: Insufficient documentation

## 2021-09-01 DIAGNOSIS — Z5321 Procedure and treatment not carried out due to patient leaving prior to being seen by health care provider: Secondary | ICD-10-CM | POA: Diagnosis not present

## 2021-09-01 LAB — BASIC METABOLIC PANEL
Anion gap: 9 (ref 5–15)
BUN: 12 mg/dL (ref 6–20)
CO2: 24 mmol/L (ref 22–32)
Calcium: 9.3 mg/dL (ref 8.9–10.3)
Chloride: 104 mmol/L (ref 98–111)
Creatinine, Ser: 0.64 mg/dL (ref 0.44–1.00)
GFR, Estimated: >60 mL/min (ref >60)
Glucose, Bld: 87 mg/dL (ref 70–99)
Potassium: 4.2 mmol/L (ref 3.5–5.1)
Sodium: 137 mmol/L (ref 135–145)

## 2021-09-01 LAB — CBC
HCT: 37.2 % (ref 36.0–46.0)
Hemoglobin: 12.2 g/dL (ref 12.0–15.0)
MCH: 29.5 pg (ref 26.0–34.0)
MCHC: 32.8 g/dL (ref 30.0–36.0)
MCV: 90.1 fL (ref 80.0–100.0)
Platelets: 381 10*3/uL (ref 150–400)
RBC: 4.13 MIL/uL (ref 3.87–5.11)
RDW: 13.7 % (ref 11.5–15.5)
WBC: 7.2 10*3/uL (ref 4.0–10.5)
nRBC: 0 % (ref 0.0–0.2)

## 2021-09-01 LAB — HCG, QUANTITATIVE, PREGNANCY: hCG, Beta Chain, Quant, S: 3 m[IU]/mL (ref <5)

## 2021-09-01 LAB — TROPONIN I (HIGH SENSITIVITY)
Troponin I (High Sensitivity): <2 ng/L (ref <18)
Troponin I (High Sensitivity): <2 ng/L (ref <18)

## 2021-09-01 NOTE — ED Notes (Signed)
Called pt 3x for vitals, no response.  

## 2021-09-01 NOTE — ED Provider Notes (Signed)
Emergency Medicine Provider Triage Evaluation Note  Melanie Vincent , a 45 y.o. female  was evaluated in triage.  Pt complains of chest pain.  She states that symptoms have been going on intermittently throughout the past week, however worse this morning.  Pain is now radiating into left shoulder and jaw.  She took aspirin at home.  History of A. fib, also took Cardizem without relief.  Review of Systems  Positive: Chest pain, nausea Negative: Shortness of breath, vomiting, fevers, chills  Physical Exam  BP 129/85 (BP Location: Left Arm)   Pulse 78   Temp 98.9 F (37.2 C) (Oral)   Resp 18   SpO2 99%  Gen:   Awake, no distress  Resp:  Normal effort  MSK:   Moves extremities without difficulty  Other:  Neurologically intact  Medical Decision Making  Medically screening exam initiated at 1:04 PM.  Appropriate orders placed.  Melanie Vincent was informed that the remainder of the evaluation will be completed by another provider, this initial triage assessment does not replace that evaluation, and the importance of remaining in the ED until their evaluation is complete.     Melanie Bandy, PA-C 09/01/21 1306    Melanie Logan, DO 09/01/21 1516

## 2021-09-01 NOTE — ED Notes (Signed)
Called pt 2x for vitals  

## 2021-09-01 NOTE — ED Triage Notes (Signed)
Patient coming from home, compliant of CP that has gotten progressively worse through the week. Worse today, with pain into left arm.

## 2021-09-07 ENCOUNTER — Telehealth (HOSPITAL_COMMUNITY): Payer: Self-pay | Admitting: *Deleted

## 2021-09-07 NOTE — Telephone Encounter (Signed)
Patient called in stating around 130 this afternoon she felt her heart start racing she checked her HR and it was in the 170s. She took a 30mg  of cardizem and called EMS. When EMS arrived they stated it did not look like afib but another arrhythmia but slowed to the 90-2110s. BP 130/80. Pt states EMS did not leave the strips with her. Now she is still feeling much improved but can tell her HR is still out of rhythm. Pt will repeat cardizem if needed if still out of rhythm after 4 hours she will use PRN flecainide as she has done in the past. Appt made for Monday (offered tomorrow pt wanted to wait).

## 2021-09-08 NOTE — Progress Notes (Deleted)
Patient ID: Melanie Vincent, female    DOB: [birth year]  MRN: [id number]  CC: No chief complaint on file.   Subjective: Melanie Vincent is a 45 y.o. female who presents for Her concerns today include: ***  GERD FOLLOW-UP: 05/25/2021: - Continue Omeprazole as prescribed.  - Referral to Gastroenterology for further evaluation and management.   09/12/2021:   2. CONGESTION:  Patient Active Problem List   Diagnosis Date Noted   Bacterial vaginitis 05/26/2021   Lumbar degenerative disc disease 05/25/2021   Educated about COVID-19 virus infection 06/13/2020   Precordial chest pain 06/13/2020   Sleep apnea suspected 10/06/2015   Anxiety reaction 09/15/2015   PAF (paroxysmal atrial fibrillation) (HCC) 11/25/2014   Herpes labialis 07/29/2013   Well woman exam (no gynecological exam) 05/02/2013   GERD (gastroesophageal reflux disease) 11/13/2012   Overweight 06/14/2012     Current Outpatient Medications on File Prior to Visit  Medication Sig Dispense Refill   cyclobenzaprine (FLEXERIL) 10 MG tablet Take 1 tablet (10 mg total) by mouth 3 (three) times daily as needed for muscle spasms. 90 tablet 1   diltiazem (CARDIZEM) 30 MG tablet Take 1 tablet every 4 hours AS NEEDED for heart rate >100 (Patient taking differently: Take 30 mg by mouth See admin instructions. Take 1 tablet every 4 hours AS NEEDED for heart rate >100) 30 tablet 3   flecainide (TAMBOCOR) 150 MG tablet Take 1-2 tablets by mouth once for afib can only use every 4 days (Patient taking differently: Take 150-300 mg by mouth See admin instructions. Take 1-2 tablets by mouth once for afib can only use every 4 days) 6 tablet 1   meloxicam (MOBIC) 15 MG tablet Take 1 tablet (15 mg total) by mouth daily. (Patient not taking: Reported on 05/24/2021) 30 tablet 1   omeprazole (PRILOSEC) 20 MG capsule Take 1 capsule (20 mg total) by mouth daily as needed (acid reflux). 90 capsule 0   valACYclovir (VALTREX) 1000 MG tablet Take 1  tablet (1,000 mg total) by mouth 2 (two) times daily as needed (for cold sores). 30 tablet 1   No current facility-administered medications on file prior to visit.    No Known Allergies  Social History   Socioeconomic History   Marital status: Single    Spouse name: Not on file   Number of children: 4   Years of education: Not on file   Highest education level: Not on file  Occupational History   Occupation: Caregiver  Tobacco Use   Smoking status: Former    Types: Cigarettes    Quit date: 06/15/2007    Years since quitting: 14.2   Smokeless tobacco: Never  Vaping Use   Vaping Use: Never used  Substance and Sexual Activity   Alcohol use: Yes    Alcohol/week: 4.0 standard drinks    Types: 2 Glasses of wine, 2 Standard drinks or equivalent per week    Comment: 3 drinks per week   Drug use: Yes    Types: Marijuana    Comment: "quit months ago"   Sexual activity: Yes    Birth control/protection: Surgical, Condom  Other Topics Concern   Not on file  Social History Narrative   Lives in [place] with 3 children and has two others.     Single.   Works as a Clinical biochemist (travel)   Social Determinants of Corporate investment banker Strain: Not on [employer] Insecurity: Not on file  Transportation Needs: Not on file  Physical Activity: Not on file  Stress: Not on file  Social Connections: Not on file  Intimate Partner Violence: Not on file    Family History  Problem Relation Age of Onset   Diabetes Father    Hypertension Father    Stroke Father    Colon polyps Father    Breast cancer Half-Sister 24   Diabetes Paternal Grandmother    Heart disease Paternal Grandmother    Diabetes Paternal Grandfather    Heart disease Paternal Grandfather    Colon cancer Paternal Aunt        late 70's   Breast cancer Maternal Aunt    Esophageal cancer Neg Hx    Rectal cancer Neg Hx    Stomach cancer Neg Hx     Past Surgical History:  Procedure Laterality Date    CESAREAN SECTION     TUBAL LIGATION     2008    ROS: Review of Systems Negative except as stated above  PHYSICAL EXAM: There were no vitals taken for this visit.  Physical Exam  {female adult master:310786} {female adult master:310785}  CMP Latest Ref Rng & Units 09/01/2021 05/25/2021 04/11/2021  Glucose 70 - 99 mg/dL 87 - 93  BUN 6 - 20 mg/dL 12 - 11  Creatinine 3.61 - 1.00 mg/dL 4.43 - 1.54  Sodium 008 - 145 mmol/L 137 - 137  Potassium 3.5 - 5.1 mmol/L 4.2 - 4.3  Chloride 98 - 111 mmol/L 104 - 105  CO2 22 - 32 mmol/L 24 - 25  Calcium 8.9 - 10.3 mg/dL 9.3 - 9.1  Total Protein 6.0 - 8.5 g/dL - 7.3 -  Total Bilirubin 0.0 - 1.2 mg/dL - 0.2 -  Alkaline Phos 44 - 121 IU/L - 55 -  AST 0 - 40 IU/L - 15 -  ALT 0 - 32 IU/L - 19 -   Lipid Panel     Component Value Date/Time   CHOL 214 (H) 05/25/2021 1006   TRIG 65 05/25/2021 1006   HDL 56 05/25/2021 1006   CHOLHDL 3.8 05/25/2021 1006   CHOLHDL 3.7 Ratio 06/01/2009 2050   VLDL 12 06/01/2009 2050   LDLCALC 146 (H) 05/25/2021 1006    CBC    Component Value Date/Time   WBC 7.2 09/01/2021 1308   RBC 4.13 09/01/2021 1308   HGB 12.2 09/01/2021 1308   HGB 12.4 08/24/2020 1202   HCT 37.2 09/01/2021 1308   HCT 38.8 08/24/2020 1202   PLT 381 09/01/2021 1308   PLT 403 08/24/2020 1202   MCV 90.1 09/01/2021 1308   MCV 92 08/24/2020 1202   MCH 29.5 09/01/2021 1308   MCHC 32.8 09/01/2021 1308   RDW 13.7 09/01/2021 1308   RDW 13.2 08/24/2020 1202   LYMPHSABS 2.8 08/24/2020 1202   MONOABS 0.6 12/24/2019 0959   EOSABS 0.1 08/24/2020 1202   BASOSABS 0.0 08/24/2020 1202    ASSESSMENT AND PLAN:  There are no diagnoses linked to this encounter.   Patient was given the opportunity to ask questions.  Patient verbalized understanding of the plan and was able to repeat key elements of the plan. Patient was given clear instructions to go to Emergency Department or return to medical center if symptoms don't improve, worsen, or new  problems develop.The patient verbalized understanding.   No orders of the defined types were placed in this encounter.    Requested Prescriptions    No prescriptions requested or ordered in this encounter    No follow-ups on file.  Camillia Herter, NP

## 2021-09-12 ENCOUNTER — Ambulatory Visit (HOSPITAL_COMMUNITY): Payer: Medicaid Other | Admitting: Physician Assistant

## 2021-09-12 ENCOUNTER — Encounter (HOSPITAL_COMMUNITY): Payer: Self-pay

## 2021-09-12 ENCOUNTER — Ambulatory Visit: Payer: Medicaid Other | Admitting: Family

## 2021-09-12 DIAGNOSIS — K219 Gastro-esophageal reflux disease without esophagitis: Secondary | ICD-10-CM

## 2021-09-12 NOTE — Progress Notes (Signed)
Cardiology Office Note:    Date:  09/14/2021   ID:  Melanie Vincent, DOB 03-Sep-1976, MRN [id number]  PCP:  Rema Fendt, NP   [facility] Providers Cardiologist:  Rollene Rotunda, MD     Referring MD: Rema Fendt, NP   Evaluation for chest discomfort  History of Present Illness:    Melanie Vincent is a 45 y.o. female with a hx of paroxysmal atrial fibrillation, GERD, precordial chest pain, and overweight.  Her history of atrial fibrillation dates back to 2015.  She has previously been successful with following a pocket flecainide.  She also avoids triggers such as alcohol.  She notes palpitations with atrial fibrillation.  Previously she noted about 2 episodes per year that would last around 12 hours.  Previously she has found rapid termination of her symptoms with flecainide use.  She was seen by Dr. Johney Frame 03/14/21.  During that time she denied palpitations, chest pain, shortness of breath, orthopnea, and PND.  She has been tolerating her medications well without difficulties and denied complaints.  She contacted the nurse triage line on 09/07/2021.  She reported she had contacted the EMS due to elevated heart rate and irregular heart rate.  Prior to contacting EMS she had noted that her pulse rate was 170.  She took a 30 mg Cardizem which slightly lowered her heart rate.  When EMS arrived they did not feel she was in atrial fibrillation.  She planned to repeat her Cardizem as needed as well as her flecainide as needed.  She presents to the clinic today for follow-up evaluation states her episode of accelerated heart rate happened after she had been nauseated, had a headache, and vomited the night before.  She had not rehydrated well and when she woke up the next day she became very busy doing her daily activities.  Her heart rate increased and she was alerted by her apple watch.  After taking her Cardizem hydrating her heart rate decreased.  She continues to report episodes of  intermittent chest discomfort that are brief lasting only seconds.  We used shared decision-making to discuss whether coronary CTA would be an appropriate test.  She wishes to proceed with test at this time.  We discussed the importance of avoiding triggers and increasing p.o. hydration.  She expressed understanding.  She also reports that she is trying to lose weight.  I will order a coronary CTA, give instructions on intermittent fasting, have her increase her physical activity as tolerated, and follow-up after coronary CTA.  She is tolerating her current medications well without side effects.  Today she denies chest pain, shortness of breath, lower extremity edema, fatigue, palpitations, melena, hematuria, hemoptysis, diaphoresis, weakness, presyncope, syncope, orthopnea, and PND.   Past Medical History:  Diagnosis Date   Anxiety    Breast lump    Mammogram (last 2013)   DDD (degenerative disc disease), lumbar    GERD (gastroesophageal reflux disease)    Herpes simplex labialis    PAF (paroxysmal atrial fibrillation) (HCC)     Past Surgical History:  Procedure Laterality Date   CESAREAN SECTION     TUBAL LIGATION     2008    Current Medications: Current Meds  Medication Sig   cyclobenzaprine (FLEXERIL) 10 MG tablet Take 1 tablet (10 mg total) by mouth 3 (three) times daily as needed for muscle spasms.   diltiazem (CARDIZEM) 30 MG tablet Take 1 tablet every 4 hours AS NEEDED for heart rate >100 (Patient  taking differently: Take 30 mg by mouth See admin instructions. Take 1 tablet every 4 hours AS NEEDED for heart rate >100)   flecainide (TAMBOCOR) 150 MG tablet Take 1-2 tablets by mouth once for afib can only use every 4 days (Patient taking differently: Take 150-300 mg by mouth See admin instructions. Take 1-2 tablets by mouth once for afib can only use every 4 days)   omeprazole (PRILOSEC) 20 MG capsule Take 1 capsule (20 mg total) by mouth daily as needed (acid reflux).    valACYclovir (VALTREX) 1000 MG tablet Take 1 tablet (1,000 mg total) by mouth 2 (two) times daily as needed (for cold sores).   [DISCONTINUED] meloxicam (MOBIC) 15 MG tablet Take 1 tablet (15 mg total) by mouth daily.     Allergies:   Patient has no known allergies.   Social History   Socioeconomic History   Marital status: Single    Spouse name: Not on file   Number of children: 4   Years of education: Not on file   Highest education level: Not on file  Occupational History   Occupation: Caregiver  Tobacco Use   Smoking status: Former    Types: Cigarettes    Quit date: 06/15/2007    Years since quitting: 14.2   Smokeless tobacco: Never  Vaping Use   Vaping Use: Never used  Substance and Sexual Activity   Alcohol use: Yes    Alcohol/week: 4.0 standard drinks    Types: 2 Glasses of wine, 2 Standard drinks or equivalent per week    Comment: 3 drinks per week   Drug use: Yes    Types: Marijuana    Comment: "quit months ago"   Sexual activity: Yes    Birth control/protection: Surgical, Condom  Other Topics Concern   Not on file  Social History Narrative   Lives in [place] with 3 children and has two others.     Single.   Works as a Clinical biochemist (travel)   Social Determinants of Corporate investment banker Strain: Not on [employer] Insecurity: Not on file  Transportation Needs: Not on file  Physical Activity: Not on file  Stress: Not on file  Social Connections: Not on file     Family History: The patient's family history includes Breast cancer in her maternal aunt; Breast cancer (age of onset: 74) in her half-sister; Colon cancer in her paternal aunt; Colon polyps in her father; Diabetes in her father, paternal grandfather, and paternal grandmother; Heart disease in her paternal grandfather and paternal grandmother; Hypertension in her father; Stroke in her father. There is no history of Esophageal cancer, Rectal cancer, or Stomach cancer.  ROS:   Please  see the history of present illness.     All other systems reviewed and are negative.   Risk Assessment/Calculations:    CHA2DS2-VASc Score = 1   This indicates a 0.6% annual risk of stroke. The patient's score is based upon: CHF History: 0 HTN History: 0 Diabetes History: 0 Stroke History: 0 Vascular Disease History: 0 Age Score: 0 Gender Score: 1          Physical Exam:    VS:  BP 112/72   Pulse 77   Ht 5\' 7"  (1.702 m)   Wt 204 lb (92.5 kg)   SpO2 99%   BMI 31.95 kg/m     Wt Readings from Last 3 Encounters:  09/14/21 204 lb (92.5 kg)  05/25/21 205 lb 6.4 oz (93.2 kg)  04/11/21  200 lb (90.7 kg)     GEN:  Well nourished, well developed in no acute distress HEENT: Normal NECK: No JVD; No carotid bruits LYMPHATICS: No lymphadenopathy CARDIAC: RRR, no murmurs, rubs, gallops RESPIRATORY:  Clear to auscultation without rales, wheezing or rhonchi  ABDOMEN: Soft, non-tender, non-distended MUSCULOSKELETAL:  No edema; No deformity  SKIN: Warm and dry NEUROLOGIC:  Alert and oriented x 3 PSYCHIATRIC:  Normal affect    EKGs/Labs/Other Studies Reviewed:    The following studies were reviewed today: Echocardiogram 04/07/2020  IMPRESSIONS     1. Left ventricular ejection fraction, by estimation, is 60 to 65%. The  left ventricle has normal function. The left ventricle has no regional  wall motion abnormalities. Left ventricular diastolic parameters were  normal.   2. Right ventricular systolic function is normal. The right ventricular  size is normal. There is normal pulmonary artery systolic pressure.   3. The mitral valve is abnormal. Trivial mitral valve regurgitation.   4. The aortic valve is tricuspid. Aortic valve regurgitation is not  visualized.   5. The inferior vena cava is normal in size with greater than 50%  respiratory variability, suggesting right atrial pressure of 3 mmHg.  EKG:  EKG is  ordered today.  The ekg ordered today demonstrates normal  sinus rhythm no ST or T wave deviation 77 bpm  Recent Labs: 05/25/2021: ALT 19; TSH 0.492 09/01/2021: BUN 12; Creatinine, Ser 0.64; Hemoglobin 12.2; Platelets 381; Potassium 4.2; Sodium 137  Recent Lipid Panel    Component Value Date/Time   CHOL 214 (H) 05/25/2021 1006   TRIG 65 05/25/2021 1006   HDL 56 05/25/2021 1006   CHOLHDL 3.8 05/25/2021 1006   CHOLHDL 3.7 Ratio 06/01/2009 2050   VLDL 12 06/01/2009 2050   LDLCALC 146 (H) 05/25/2021 1006    ASSESSMENT & PLAN    Paroxysmal atrial fibrillation-EKG today shows normal sinus rhythm no ST or T wave deviation 77 bpm.  Palpitations terminated after she took a dose of her Cardizem medication and hydrated.  She noted palpitations and accelerated heart rate on 09/07/2021.  At that time she took a dose of Cardizem and contacted EMS.  They did not feel she was in atrial fibrillation.  CHA2DS2-VASc score 1. Continue Cardizem as needed, flecainide as needed, Heart healthy low-sodium diet-salty 6 given Increase physical activity as tolerated Continue to avoid triggers caffeine, chocolate.  Dehydration etc.  Atypical chest discomfort-notices brief intermittent episodes of chest discomfort with increased physical activity and at rest.  Describes episodes as sharp and on the left side of her chest. Heart healthy low-sodium diet. Increase physical activity as tolerated Order coronary CTA  Disposition: Follow-up with Dr. Antoine Poche or me in 1-2 months.     Medication Adjustments/Labs and Tests Ordered: Current medicines are reviewed at length with the patient today.  Concerns regarding medicines are outlined above.  No orders of the defined types were placed in this encounter.  No orders of the defined types were placed in this encounter.   There are no Patient Instructions on file for this visit.   Signed, Ronney Asters, NP  09/14/2021 8:40 AM      Notice: This dictation was prepared with Dragon dictation along with smaller phrase  technology. Any transcriptional errors that result from this process are unintentional and may not be corrected upon review.  I spent 14 minutes examining this patient, reviewing medications, and using patient centered shared decision making involving her cardiac care.  Prior to her visit I  spent greater than 20 minutes reviewing her past medical history,  medications, and prior cardiac tests.

## 2021-09-14 ENCOUNTER — Other Ambulatory Visit: Payer: Self-pay

## 2021-09-14 ENCOUNTER — Encounter (HOSPITAL_BASED_OUTPATIENT_CLINIC_OR_DEPARTMENT_OTHER): Payer: Self-pay | Admitting: General Practice

## 2021-09-14 ENCOUNTER — Other Ambulatory Visit (HOSPITAL_BASED_OUTPATIENT_CLINIC_OR_DEPARTMENT_OTHER): Payer: Self-pay | Admitting: General Practice

## 2021-09-14 ENCOUNTER — Ambulatory Visit (HOSPITAL_BASED_OUTPATIENT_CLINIC_OR_DEPARTMENT_OTHER): Payer: Medicaid Other | Admitting: General Practice

## 2021-09-14 VITALS — BP 112/72 | HR 77 | Ht 67.0 in | Wt 204.0 lb

## 2021-09-14 DIAGNOSIS — R072 Precordial pain: Secondary | ICD-10-CM | POA: Diagnosis not present

## 2021-09-14 DIAGNOSIS — I480 Paroxysmal atrial fibrillation: Secondary | ICD-10-CM | POA: Diagnosis not present

## 2021-09-14 MED ORDER — METOPROLOL TARTRATE 100 MG PO TABS
100.0000 mg | ORAL_TABLET | Freq: Once | ORAL | 0 refills | Status: DC
  Filled 2021-09-14: qty 1, 1d supply, fill #0

## 2021-09-14 NOTE — Patient Instructions (Signed)
Medication Instructions:   Your physician recommends that you continue on your current medications as directed. Please refer to the Current Medication list given to you today.  *If you need a refill on your cardiac medications before your next appointment, please call your pharmacy*   Lab Work: Your physician recommends that you return for lab work within one week of Coronary CTA: BMET  If you have labs (blood work) drawn today and your tests are completely normal, you will receive your results only by: MyChart Message (if you have MyChart) OR A paper copy in the mail If you have any lab test that is abnormal or we need to change your treatment, we will call you to review the results.   Testing/Procedures: Coronary CTA--see instructions below   Follow-Up: At [facility], you and your health needs are our priority.  As part of our continuing mission to provide you with exceptional heart care, we have created designated Provider Care Teams.  These Care Teams include your primary Cardiologist (physician) and Advanced Practice Providers (APPs -  Physician Assistants and Nurse Practitioners) who all work together to provide you with the care you need, when you need it.  We recommend signing up for the patient portal called "MyChart".  Sign up information is provided on this After Visit Summary.  MyChart is used to connect with patients for Virtual Visits (Telemedicine).  Patients are able to view lab/test results, encounter notes, upcoming appointments, etc.  Non-urgent messages can be sent to your provider as well.   To learn more about what you can do with MyChart, go to [email or web address].    Your next appointment:   1-2 month(s)  The format for your next appointment:   In Person  Provider:   You may see Rollene Rotunda, MD or one of the following Advanced Practice Providers on your designated Care Team:   Edd Fabian, NP   Other Instructions  Edd Fabian, NP has  recommended that you increase your hydration and physical activity as tolerated. Please review the information below including instructions for CTA and how to avoid palpitation triggers.     Your cardiac CT will be scheduled at one of the below locations:   [facility] [address] [place], [place] [address] [phone]  If scheduled at [facility], please arrive at the [facility] main entrance (entrance A) of [facility] 30 minutes prior to test start time. Proceed to the [facility] Radiology Department (first floor) to check-in and test prep.   Please follow these instructions carefully (unless otherwise directed):  On the Night Before the Test: Be sure to Drink plenty of water. Do not consume any caffeinated/decaffeinated beverages or chocolate 12 hours prior to your test. Do not take any antihistamines 12 hours prior to your test.  On the Day of the Test: Drink plenty of water until 1 hour prior to the test. Do not eat any food 4 hours prior to the test. You may take your regular medications prior to the test.  Take metoprolol (Lopressor) 90-120 minutes prior to test. FEMALES- please wear underwire-free bra if available, avoid dresses & tight clothing      After the Test: Drink plenty of water. After receiving IV contrast, you may experience a mild flushed feeling. This is normal. On occasion, you may experience a mild rash up to 24 hours after the test. This is not dangerous. If this occurs, you can take Benadryl 25 mg and increase your fluid  intake. If you experience trouble breathing, this can be serious. If it is severe call 911 IMMEDIATELY. If it is mild, please call our office. If you take any of these medications: Glipizide/Metformin, Avandament, Glucavance, please do not take 48 hours after completing test unless otherwise instructed.  Please allow 2-4 weeks for scheduling of routine cardiac CTs. Some insurance companies require a  pre-authorization which may delay scheduling of this test.   For non-scheduling related questions, please contact the cardiac imaging nurse navigator should you have any questions/concerns: Rockwell Alexandria, Cardiac Imaging Nurse Navigator Larey Brick, Cardiac Imaging Nurse Navigator [facility] Direct Office Dial: [phone]   For scheduling needs, including cancellations and rescheduling, please call [place], [phone].   Palpitations Palpitations are feelings that your heartbeat is not normal. Your heartbeat may feel like it is: Uneven. Faster than normal. Fluttering. Skipping a beat. This is usually not a serious problem. In some cases, you may need tests to rule out any serious problems. Follow these instructions at home: Pay attention to any changes in your condition. Take these actions to help manage your symptoms: Eating and drinking Avoid: Coffee, tea, soft drinks, and energy drinks. Chocolate. Alcohol. Diet pills. Lifestyle  Try to lower your stress. These things can help you relax: Yoga. Deep breathing and meditation. Exercise. Using words and images to create positive thoughts (guided imagery). Using your mind to control things in your body (biofeedback). Do not use drugs. Get plenty of rest and sleep. Keep a regular bed time. General instructions  Take over-the-counter and prescription medicines only as told by your doctor. Do not use any products that contain nicotine or tobacco, such as cigarettes and e-cigarettes. If you need help quitting, ask your doctor. Keep all follow-up visits as told by your doctor. This is important. You may need more tests if palpitations do not go away or get worse. Contact a doctor if: Your symptoms last more than 24 hours. Your symptoms occur more often. Get help right away if you: Have chest pain. Feel short of breath. Have a very bad headache. Feel dizzy. Pass out  (faint). Summary Palpitations are feelings that your heartbeat is uneven or faster than normal. It may feel like your heart is fluttering or skipping a beat. Avoid food and drinks that may cause palpitations. These include caffeine, chocolate, and alcohol. Try to lower your stress. Do not smoke or use drugs. Get help right away if you faint or have chest pain, shortness of breath, a severe headache, or dizziness. This information is not intended to replace advice given to you by your health care provider. Make sure you discuss any questions you have with your health care provider. Document Revised: 03/25/2021 Document Reviewed: 01/09/2018 Elsevier Patient Education  2022 [employer].

## 2021-09-21 ENCOUNTER — Other Ambulatory Visit: Payer: Self-pay

## 2021-09-21 NOTE — Progress Notes (Signed)
Erroneous encounter

## 2021-09-22 ENCOUNTER — Telehealth (HOSPITAL_COMMUNITY): Payer: Self-pay | Admitting: *Deleted

## 2021-09-22 NOTE — Telephone Encounter (Signed)
Attempted to call patient regarding upcoming cardiac CT appointment. °Left message on voicemail with name and callback number ° °Sloan Galentine RN Navigator Cardiac Imaging °[facility] °[phone] Office °[phone] Cell ° °

## 2021-09-23 ENCOUNTER — Telehealth (HOSPITAL_COMMUNITY): Payer: Self-pay

## 2021-09-23 ENCOUNTER — Telehealth (HOSPITAL_COMMUNITY): Payer: Self-pay | Admitting: *Deleted

## 2021-09-23 ENCOUNTER — Other Ambulatory Visit (HOSPITAL_COMMUNITY): Payer: Self-pay | Admitting: *Deleted

## 2021-09-23 MED ORDER — METOPROLOL TARTRATE 100 MG PO TABS: 100.0000 mg | ORAL_TABLET | Freq: Once | ORAL | 0 refills | Status: DC

## 2021-09-23 NOTE — Telephone Encounter (Signed)
Reaching out to patient to offer assistance regarding upcoming cardiac imaging study; pt verbalizes understanding of appt date/time, parking situation and where to check in, pre-test NPO status and medications ordered, and verified current allergies; name and call back number provided for further questions should they arise  Nathen Balaban RN Navigator Cardiac Imaging [facility] [phone] office [phone] cell  

## 2021-09-23 NOTE — Telephone Encounter (Signed)
Patient called and states her heart has been racing since she recently administered her Nexium pill. She checked her heart rate by checking her pulse on her wrist- ranging around 160 or higher. She has her Cardizem 30mg  and Flecainide 150mg  tablet on hand. Patient just administered her Flecainide 150mg - only taking 1/2 tablet and 1 tablet of her Cardizem 30mg  tablet. Advised patient to go lie down and rest. Instructed patient to take another 1/2 tablet of her Flecainide and she was told she can take her Cardizem 30mg  every 4 hours to help her heart rate. Consulted with patient she understands to seek the ED if her symptoms continue to worse. Patient verbalized understanding.

## 2021-09-26 ENCOUNTER — Ambulatory Visit (HOSPITAL_COMMUNITY)
Admission: RE | Admit: 2021-09-26 | Discharge: 2021-09-26 | Disposition: A | Discharge status: Home / Self Care | Payer: Medicaid Other | Source: Ambulatory Visit | Attending: General Practice | Admitting: General Practice

## 2021-09-26 ENCOUNTER — Other Ambulatory Visit: Payer: Self-pay

## 2021-09-26 DIAGNOSIS — R072 Precordial pain: Secondary | ICD-10-CM | POA: Insufficient documentation

## 2021-09-26 MED ORDER — NITROGLYCERIN 0.4 MG SL SUBL
0.8000 mg | SUBLINGUAL_TABLET | Freq: Once | SUBLINGUAL | Status: AC
  Administered 2021-09-26: 0.8 mg via SUBLINGUAL

## 2021-09-26 MED ORDER — NITROGLYCERIN 0.4 MG SL SUBL
SUBLINGUAL_TABLET | SUBLINGUAL | Status: AC
  Filled 2021-09-26: qty 2

## 2021-09-26 MED ORDER — IOHEXOL 350 MG/ML SOLN
95.0000 mL | Freq: Once | INTRAVENOUS | Status: AC | PRN
  Administered 2021-09-26: 95 mL via INTRAVENOUS

## 2021-09-27 ENCOUNTER — Encounter: Payer: Medicaid Other | Admitting: Family

## 2021-11-24 NOTE — Progress Notes (Signed)
Cardiology Office Note   Date:  11/27/2021   ID:  Melanie Vincent, DOB 05/23/1976, MRN [id number]  PCP:  Rema Fendt, NP  Cardiologist:   Rollene Rotunda, MD   Chief Complaint  Patient presents with   Atrial Fibrillation       History of Present Illness: Melanie Vincent is a 45 y.o. female who presents for followup of atrial fib.    She was seen by Dr. Johney Frame in April.  She has been treated with flecainide.  She had about of tachycardia in September that necessitated EMS to be called.  She took some extra Cardizem but they did not document atrial fibrillation.  She was seen in follow-up in our office in October.  Because of some shortness of breath and other symptoms she had a coronary CTA which demonstrated 0 calcium and normal coronaries.  It was suggested that she avoid triggers for any tachypalpitations.  She returns for follow-up.    Since I saw her she has done well.  She has some rare palpitations but she does not think she has been back in any sustained atrial fibrillation. The patient denies any new symptoms such as chest discomfort, neck or arm discomfort. There has been no new shortness of breath, PND or orthopnea. There have been no reported palpitations, presyncope or syncope.    Past Medical History:  Diagnosis Date   Anxiety    Breast lump    Mammogram (last 2013)   DDD (degenerative disc disease), lumbar    GERD (gastroesophageal reflux disease)    Herpes simplex labialis    PAF (paroxysmal atrial fibrillation) (HCC)     Past Surgical History:  Procedure Laterality Date   CESAREAN SECTION     TUBAL LIGATION     2008     Current Outpatient Medications  Medication Sig Dispense Refill   cyclobenzaprine (FLEXERIL) 10 MG tablet Take 1 tablet (10 mg total) by mouth 3 (three) times daily as needed for muscle spasms. 90 tablet 1   diltiazem (CARDIZEM) 30 MG tablet Take 1 tablet every 4 hours AS NEEDED for heart rate >100 (Patient taking differently:  Take 30 mg by mouth See admin instructions. Take 1 tablet every 4 hours AS NEEDED for heart rate >100) 30 tablet 3   flecainide (TAMBOCOR) 150 MG tablet Take 1-2 tablets by mouth once for afib can only use every 4 days (Patient taking differently: Take 150-300 mg by mouth See admin instructions. Take 1-2 tablets by mouth once for afib can only use every 4 days) 6 tablet 1   valACYclovir (VALTREX) 1000 MG tablet Take 1 tablet (1,000 mg total) by mouth 2 (two) times daily as needed (for cold sores). 30 tablet 1   omeprazole (PRILOSEC) 20 MG capsule Take 1 capsule (20 mg total) by mouth daily as needed (acid reflux). 90 capsule 0   No current facility-administered medications for this visit.    Allergies:   Patient has no known allergies.    ROS:  Please see the history of present illness.   Otherwise, review of systems are positive for none.   All other systems are reviewed and negative.    PHYSICAL EXAM: VS:  BP 96/70 (BP Location: Left Arm, Patient Position: Sitting, Cuff Size: Large)    Pulse 98    Ht 5\' 7"  (1.702 m)    Wt 202 lb (91.6 kg)    BMI 31.64 kg/m  , BMI Body mass index is 31.64 kg/m. GENERAL:  Well appearing NECK:  No jugular venous distention, waveform within normal limits, carotid upstroke brisk and symmetric, no bruits, no thyromegaly LUNGS:  Clear to auscultation bilaterally CHEST:  Unremarkable HEART:  PMI not displaced or sustained,S1 and S2 within normal limits, no S3, no S4, no clicks, no rubs, no murmurs ABD:  Flat, positive bowel sounds normal in frequency in pitch, no bruits, no rebound, no guarding, no midline pulsatile mass, no hepatomegaly, no splenomegaly EXT:  2 plus pulses throughout, no edema, no cyanosis no clubbing  EKG:  EKG is  ordered today. The ekg ordered today demonstrates sinus rhythm, rate 98, axis within normal limits, intervals within normal limits, no acute ST-T wave changes.   Recent Labs: 05/25/2021: ALT 19; TSH 0.492 09/01/2021: BUN 12;  Creatinine, Ser 0.64; Hemoglobin 12.2; Platelets 381; Potassium 4.2; Sodium 137    Lipid Panel    Component Value Date/Time   CHOL 214 (H) 05/25/2021 1006   TRIG 65 05/25/2021 1006   HDL 56 05/25/2021 1006   CHOLHDL 3.8 05/25/2021 1006   CHOLHDL 3.7 Ratio 06/01/2009 2050   VLDL 12 06/01/2009 2050   LDLCALC 146 (H) 05/25/2021 1006      Wt Readings from Last 3 Encounters:  11/25/21 202 lb (91.6 kg)  09/14/21 204 lb (92.5 kg)  05/25/21 205 lb 6.4 oz (93.2 kg)      Other studies Reviewed: Additional studies/ records that were reviewed today include: CT, labs Review of the above records demonstrates:  Please see elsewhere in the note.     ASSESSMENT AND PLAN:  ATRIAL FIB:  Melanie Vincent has a CHA2DS2 - VASc score of 1.   She is controlling this with as needed flecainide and Cardizem.  She has had a conversation about ablation but wants to avoid this and I think it is reasonable.  She can avoid triggers.    CHEST PAIN: She has no coronary disease.  She has had nonanginal chest pain but this is not particular bothering her.  No change in therapy.  No further testing.     Current medicines are reviewed at length with the patient today.  The patient does not have concerns regarding medicines.  The following changes have been made: None  Labs/ tests ordered today include: None  Orders Placed This Encounter  Procedures   EKG 12-Lead      Disposition:   FU with me in 12 months   Signed, Rollene Rotunda, MD  11/27/2021 6:47 PM    [facility]

## 2021-11-25 ENCOUNTER — Other Ambulatory Visit: Payer: Self-pay

## 2021-11-25 ENCOUNTER — Ambulatory Visit (INDEPENDENT_AMBULATORY_CARE_PROVIDER_SITE_OTHER): Payer: Medicaid Other | Admitting: Cardiology

## 2021-11-25 ENCOUNTER — Encounter: Payer: Self-pay | Admitting: Cardiology

## 2021-11-25 VITALS — BP 96/70 | HR 98 | Ht 67.0 in | Wt 202.0 lb

## 2021-11-25 DIAGNOSIS — R072 Precordial pain: Secondary | ICD-10-CM

## 2021-11-25 DIAGNOSIS — I480 Paroxysmal atrial fibrillation: Secondary | ICD-10-CM | POA: Diagnosis not present

## 2021-11-25 NOTE — Patient Instructions (Addendum)
Medication Instructions:  Your Physician recommend you continue on your current medication as directed.    Stop taking metoprolol.   *If you need a refill on your cardiac medications before your next appointment, please call your pharmacy*  Follow-Up: At [facility], you and your health needs are our priority.  As part of our continuing mission to provide you with exceptional heart care, we have created designated Provider Care Teams.  These Care Teams include your primary Cardiologist (physician) and Advanced Practice Providers (APPs -  Physician Assistants and Nurse Practitioners) who all work together to provide you with the care you need, when you need it.  We recommend signing up for the patient portal called "MyChart".  Sign up information is provided on this After Visit Summary.  MyChart is used to connect with patients for Virtual Visits (Telemedicine).  Patients are able to view lab/test results, encounter notes, upcoming appointments, etc.  Non-urgent messages can be sent to your provider as well.   To learn more about what you can do with MyChart, go to [email or web address].    Your next appointment:   12 month(s)  The format for your next appointment:   In Person  Provider:   Rollene Rotunda, MD

## 2021-11-27 ENCOUNTER — Encounter: Payer: Self-pay | Admitting: Cardiology

## 2021-11-30 ENCOUNTER — Telehealth (HOSPITAL_COMMUNITY): Payer: Self-pay | Admitting: Nurse Practitioner

## 2021-11-30 NOTE — Telephone Encounter (Signed)
Pt called this am to report that she went out of rhythm  around 5:30 am. She took 30 mg Cardizem waited 1/2 hour and then took 150 mg of flecainide, 3 hours later she took 75 mg flecainide. She converted around 11 am.she is in SR now. She does need new rx called in as her scripts have expired. Will send in for her.

## 2021-12-01 ENCOUNTER — Other Ambulatory Visit (HOSPITAL_COMMUNITY): Payer: Self-pay | Admitting: *Deleted

## 2021-12-01 MED ORDER — DILTIAZEM HCL 30 MG PO TABS: ORAL_TABLET | ORAL | 3 refills | Status: DC

## 2021-12-01 MED ORDER — FLECAINIDE ACETATE 150 MG PO TABS: ORAL_TABLET | ORAL | 1 refills | Status: DC

## 2021-12-14 NOTE — Progress Notes (Signed)
Virtual Visit via Telephone Note  I connected with Melanie Vincent, on 12/23/2021 at 1:27 PM by telephone due to the COVID-19 pandemic and verified that I am speaking with the correct person using two identifiers.  Due to current restrictions/limitations of in-office visits due to the COVID-19 pandemic, this scheduled clinical appointment was converted to a telehealth visit.   Consent: I discussed the limitations, risks, security and privacy concerns of performing an evaluation and management service by telephone and the availability of in person appointments. I also discussed with the patient that there may be a patient responsible charge related to this service. The patient expressed understanding and agreed to proceed.   Location of Patient: Home  Location of Provider: [facility]   Persons participating in Telemedicine visit: Melanie Carry, NP Melanie Vincent, CMA  History of Present Illness: Melanie Vincent is a 46 y.o. female who presents for elbow pain.   Her concerns today include:    ELBOW PAIN: Right elbow pain persisting for months. Reports hard to describe. Feels severe radiating to the right shoulder. Notices right hand numb and awakening out of sleep during night. Since the has progressed to both hands feeling numb. She does sleep on her sides at night. Does not participate in a lot of repetitive motion of hands and arms.   2. PAROXYSMAL ATRIAL FIBRILLATION FOLLOW-UP: 3. PRECORDIAL CHEST PAIN FOLLOW-UP: 11/25/2021 [facility] per MD note: ATRIAL FIB:  Ms. Melanie Vincent has a CHA2DS2 - VASc score of 1.   She is controlling this with as needed flecainide and Cardizem.  She has had a conversation about ablation but wants to avoid this and I think it is reasonable.  She can avoid triggers.  CHEST PAIN: She has no coronary disease.  She has had nonanginal chest pain but this is not particular bothering her.   No change in therapy.  No further testing.    12/23/2021: Reports still having chest pain and chest discomfort. Thinks related to lungs. History of Covid.   Past Medical History:  Diagnosis Date   Anxiety    Breast lump    Mammogram (last 2013)   DDD (degenerative disc disease), lumbar    GERD (gastroesophageal reflux disease)    Herpes simplex labialis    PAF (paroxysmal atrial fibrillation) (HCC)    No Known Allergies  Current Outpatient Medications on File Prior to Visit  Medication Sig Dispense Refill   cyclobenzaprine (FLEXERIL) 10 MG tablet Take 1 tablet (10 mg total) by mouth 3 (three) times daily as needed for muscle spasms. 90 tablet 1   diltiazem (CARDIZEM) 30 MG tablet Take 1 tablet every 4 hours AS NEEDED for heart rate >100 30 tablet 3   flecainide (TAMBOCOR) 150 MG tablet Take 1-2 tablets by mouth once for afib can only use every 4 days 6 tablet 1   omeprazole (PRILOSEC) 20 MG capsule Take 1 capsule (20 mg total) by mouth daily as needed (acid reflux). 90 capsule 0   valACYclovir (VALTREX) 1000 MG tablet Take 1 tablet (1,000 mg total) by mouth 2 (two) times daily as needed (for cold sores). 30 tablet 1   No current facility-administered medications on file prior to visit.    Observations/Objective: Alert and oriented x 3. Not in acute distress. Physical examination not completed as this is a telemedicine visit.  Assessment and Plan: 1. Right elbow pain: - Gabapentin as prescribed. Discussed medication compliance and adverse effects.  -  Follow-up with primary provider in  4 weeks or sooner if needed.  - gabapentin (NEURONTIN) 300 MG capsule; Take 1 capsule (300 mg total) by mouth at bedtime.  Dispense: 30 capsule; Refill: 0  2. Chest discomfort: 3. Precordial chest pain: 4. History of COVID-19: - Referral to Pulmonology for further evaluation and management. - Ambulatory referral to Pulmonology   Follow Up Instructions: Follow-up with primary provider in 4  weeks or sooner if needed. Referral to Pulmonology.    Patient was given clear instructions to go to Emergency Department or return to medical center if symptoms don't improve, worsen, or new problems develop.The patient verbalized understanding.  I discussed the assessment and treatment plan with the patient. The patient was provided an opportunity to ask questions and all were answered. The patient agreed with the plan and demonstrated an understanding of the instructions.   The patient was advised to call back or seek an in-person evaluation if the symptoms worsen or if the condition fails to improve as anticipated.    I provided 10 minutes total of non-face-to-face time during this encounter.   Rema Fendt, NP  [facility] at [facility] [place], [place] [phone] 12/23/2021, 1:27 PM

## 2021-12-16 ENCOUNTER — Ambulatory Visit: Payer: Medicaid Other | Admitting: Family

## 2021-12-23 ENCOUNTER — Other Ambulatory Visit: Payer: Self-pay

## 2021-12-23 ENCOUNTER — Encounter: Payer: Self-pay | Admitting: Family

## 2021-12-23 ENCOUNTER — Ambulatory Visit (INDEPENDENT_AMBULATORY_CARE_PROVIDER_SITE_OTHER): Payer: Medicaid Other | Admitting: Family

## 2021-12-23 DIAGNOSIS — R0789 Other chest pain: Secondary | ICD-10-CM

## 2021-12-23 DIAGNOSIS — M25521 Pain in right elbow: Secondary | ICD-10-CM | POA: Diagnosis not present

## 2021-12-23 DIAGNOSIS — Z8616 Personal history of COVID-19: Secondary | ICD-10-CM | POA: Diagnosis not present

## 2021-12-23 DIAGNOSIS — R072 Precordial pain: Secondary | ICD-10-CM | POA: Diagnosis not present

## 2021-12-23 MED ORDER — GABAPENTIN 300 MG PO CAPS: 300.0000 mg | ORAL_CAPSULE | Freq: Every day | ORAL | 0 refills | Status: DC

## 2021-12-23 NOTE — Progress Notes (Signed)
Pt presents for right elbow pain, pt complains of mild chest pains that's being going on for about 2 months, states that its a weird feeling and bilateral hand numbness

## 2022-01-06 ENCOUNTER — Institutional Professional Consult (permissible substitution): Payer: Medicaid Other | Admitting: Pulmonary Disease

## 2022-01-27 ENCOUNTER — Other Ambulatory Visit: Payer: Self-pay | Admitting: Family

## 2022-01-27 ENCOUNTER — Other Ambulatory Visit: Payer: Self-pay

## 2022-01-27 DIAGNOSIS — K219 Gastro-esophageal reflux disease without esophagitis: Secondary | ICD-10-CM

## 2022-01-30 ENCOUNTER — Other Ambulatory Visit: Payer: Self-pay

## 2022-01-30 MED ORDER — OMEPRAZOLE 20 MG PO CPDR
20.0000 mg | DELAYED_RELEASE_CAPSULE | Freq: Every day | ORAL | 0 refills | Status: DC | PRN
  Filled 2022-01-30: qty 90, 90d supply, fill #0

## 2022-02-02 NOTE — Progress Notes (Signed)
Synopsis: Referred for precordial chest pain by Camillia Herter, NP  Subjective:   PATIENT ID: Melanie Vincent GENDER: female DOB: 11-29-1976, MRN: [id number]  Chief Complaint  Patient presents with   Pulmonary Consult    Referred by Durene Fruits, NP.  Pt c/o SOB and strange feeling in her chest off and on since Summer 2022. She had covid around the time symptoms started. Symptoms come and go with no specific trigger.    45yF with history of anxiety, GERD, symptomatic pAF on as needed flecainide and cardizem - she easily recognizes when she's in it, former smoker, covid-19 infection referred for 2 months of precordial CP  She has had intermittent sensation of a 'fullness in her chest.' Comes and goes abruptly. Comes on at rest and then resolves spontaneously however exertion also can trigger it. She has been concerned that it's a breathing issue, feels dyspneic when it happens. This sensation preceded covid-19 by about a month - got covid-19 last July. Then had cough for a little while, currently she has no cough. No trouble breathing when she lies down. She's not totally sure if there's positional component but does notice that if she leans forward it's maybe worse. Has never had a blood clot, early pregnancy losses.  She is on omeprazole but not taking daily - only 1-2x per week due to symptomatic reflux.  Sometimes she does have some trouble swallowing - especially solids.   Otherwise pertinent review of systems is negative.  She has no family history of lung disease  She is a surgical tech, CNA at [employer], does per diem. Limited exposure to dusts/solvents/particulates without a mask. Smoked for 5y in early 2000s. Occasional MJ, cbd. She has no indoor pets.    Past Medical History:  Diagnosis Date   Anxiety    Breast lump    Mammogram (last 2013)   DDD (degenerative disc disease), lumbar    GERD (gastroesophageal reflux disease)    Herpes simplex labialis    PAF  (paroxysmal atrial fibrillation) ([place])      Family History  Problem Relation Age of Onset   Diabetes Father    Hypertension Father    Stroke Father    Colon polyps Father    Breast cancer Half-Sister 75   Diabetes Paternal Grandmother    Heart disease Paternal Grandmother    Diabetes Paternal Grandfather    Heart disease Paternal Grandfather    Colon cancer Paternal Aunt        late 70's   Breast cancer Maternal Aunt    Esophageal cancer Neg Hx    Rectal cancer Neg Hx    Stomach cancer Neg Hx      Past Surgical History:  Procedure Laterality Date   CESAREAN SECTION     TUBAL LIGATION     2008    Social History   Socioeconomic History   Marital status: Single    Spouse name: Not on file   Number of children: 4   Years of education: Not on file   Highest education level: Not on file  Occupational History   Occupation: Caregiver  Tobacco Use   Smoking status: Former    Packs/day: 0.25    Years: 5.00    Pack years: 1.25    Types: Cigarettes    Quit date: 06/15/2007    Years since quitting: 14.6   Smokeless tobacco: Never  Vaping Use   Vaping Use: Some days   Substances: CBD   Devices: smoked  CBD vape occ  Substance and Sexual Activity   Alcohol use: Yes    Alcohol/week: 4.0 standard drinks    Types: 2 Glasses of wine, 2 Standard drinks or equivalent per week    Comment: 3 drinks per week   Drug use: Yes    Types: Marijuana   Sexual activity: Yes    Birth control/protection: Surgical, Condom  Other Topics Concern   Not on file  Social History Narrative   Lives in [place] with 3 children and has two others.     Single.   Works as a Radiation protection practitioner (travel)   Social Determinants of Radio broadcast assistant Strain: Not on [employer] Insecurity: Not on file  Transportation Needs: Not on file  Physical Activity: Not on file  Stress: Not on file  Social Connections: Not on file  Intimate Partner Violence: Not on file     No Known Allergies    Outpatient Medications Prior to Visit  Medication Sig Dispense Refill   cyclobenzaprine (FLEXERIL) 10 MG tablet Take 1 tablet (10 mg total) by mouth 3 (three) times daily as needed for muscle spasms. 90 tablet 1   diltiazem (CARDIZEM) 30 MG tablet Take 1 tablet every 4 hours AS NEEDED for heart rate >100 30 tablet 3   flecainide (TAMBOCOR) 150 MG tablet Take 1-2 tablets by mouth once for afib can only use every 4 days 6 tablet 1   gabapentin (NEURONTIN) 300 MG capsule Take 1 capsule (300 mg total) by mouth at bedtime. 30 capsule 0   omeprazole (PRILOSEC) 20 MG capsule Take 1 capsule (20 mg total) by mouth daily as needed (acid reflux). (Patient taking differently: Take 20 mg by mouth daily.) 90 capsule 0   valACYclovir (VALTREX) 1000 MG tablet Take 1 tablet (1,000 mg total) by mouth 2 (two) times daily as needed (for cold sores). 30 tablet 1   No facility-administered medications prior to visit.       Objective:   Physical Exam:  General appearance: 46 y.o., female, NAD, conversant  Eyes: anicteric sclerae; PERRL, tracking appropriately HENT: NCAT; MMM Neck: Trachea midline; no lymphadenopathy, no JVD Lungs: CTAB, no crackles, no wheeze, with normal respiratory effort CV: RRR, no murmur  Abdomen: Soft, non-tender; non-distended, BS present  Extremities: No peripheral edema, warm Skin: Normal turgor and texture; no rash Psych: Appropriate affect Neuro: Alert and oriented to person and place, no focal deficit     Vitals:   02/03/22 0908  BP: 122/74  Pulse: 84  Temp: 97.8 F (36.6 C)  TempSrc: Oral  SpO2: 99%  Weight: 209 lb (94.8 kg)  Height: 5\' 7"  (1.702 m)   99% on RA BMI Readings from Last 3 Encounters:  02/03/22 32.73 kg/m  11/25/21 31.64 kg/m  09/14/21 31.95 kg/m   Wt Readings from Last 3 Encounters:  02/03/22 209 lb (94.8 kg)  11/25/21 202 lb (91.6 kg)  09/14/21 204 lb (92.5 kg)     CBC    Component Value Date/Time   WBC 7.2 09/01/2021 1308    RBC 4.13 09/01/2021 1308   HGB 12.2 09/01/2021 1308   HGB 12.4 08/24/2020 1202   HCT 37.2 09/01/2021 1308   HCT 38.8 08/24/2020 1202   PLT 381 09/01/2021 1308   PLT 403 08/24/2020 1202   MCV 90.1 09/01/2021 1308   MCV 92 08/24/2020 1202   MCH 29.5 09/01/2021 1308   MCHC 32.8 09/01/2021 1308   RDW 13.7 09/01/2021 1308   RDW 13.2 08/24/2020  1202   LYMPHSABS 2.8 08/24/2020 1202   MONOABS 0.6 12/24/2019 0959   EOSABS 0.1 08/24/2020 1202   BASOSABS 0.0 08/24/2020 1202    Eos 100  Chest Imaging: CTA coronaries 09/26/21 reviewed by me remarkable for RML/lingular scarring - mild in extent.   Pulmonary Functions Testing Results: No flowsheet data found.  PSG 2017 AHI 2.7 , O2 nadir of 92%  Echocardiogram:   TTE 03/2020:  1. Left ventricular ejection fraction, by estimation, is 60 to 65%. The  left ventricle has normal function. The left ventricle has no regional  wall motion abnormalities. Left ventricular diastolic parameters were  normal.   2. Right ventricular systolic function is normal. The right ventricular  size is normal. There is normal pulmonary artery systolic pressure.   3. The mitral valve is abnormal. Trivial mitral valve regurgitation.   4. The aortic valve is tricuspid. Aortic valve regurgitation is not  visualized.   5. The inferior vena cava is normal in size with greater than 50%  respiratory variability, suggesting right atrial pressure of 3 mmHg.      Assessment & Plan:   # Atypical chest pain: With her associated dyspnea and occasional wheeze she is worried about the possibility of asthma. Covid-19 related small airways disease is possible, covid-19 related ILD not present with normal CTA lung bases. No pericardial effusion on coronary CTA, doubt pericarditis and nature of pain not clearly pericarditic. She PERCs out for PE. Overall her history to me sounds most suggestive though of a GI issue whether reflux, esophageal pathology given intermittent  dysphagia, or globus sensation. Not completely reproducible on exam - MSK strain possible but less likely  Plan: - increase to omeprazole 40 mg nightly 30 minutes before dinner for 8 weeks - PFT in 4-6 weeks on same day as next clinic appointment to discuss - If no evidence of asthma then methacholine challenge with or without referral to GI. Even if she's feeling better from an atypical CP standpoint next visit, could be worth seeing GI again given complaint of dysphagia.      Maryjane Hurter, MD [facility] 02/03/2022 9:14 AM

## 2022-02-03 ENCOUNTER — Encounter: Payer: Self-pay | Admitting: Student

## 2022-02-03 ENCOUNTER — Ambulatory Visit: Payer: Medicaid Other | Admitting: Student

## 2022-02-03 ENCOUNTER — Other Ambulatory Visit: Payer: Self-pay

## 2022-02-03 VITALS — BP 122/74 | HR 84 | Temp 97.8°F | Ht 67.0 in | Wt 209.0 lb

## 2022-02-03 DIAGNOSIS — R0789 Other chest pain: Secondary | ICD-10-CM

## 2022-02-03 DIAGNOSIS — K219 Gastro-esophageal reflux disease without esophagitis: Secondary | ICD-10-CM | POA: Diagnosis not present

## 2022-02-03 MED ORDER — OMEPRAZOLE 40 MG PO CPDR: 40.0000 mg | DELAYED_RELEASE_CAPSULE | Freq: Every day | ORAL | 0 refills | Status: DC

## 2022-02-03 NOTE — Patient Instructions (Addendum)
-   You will be called to schedule breathing tests and we'll see you in about 4-6 weeks in clinic - Start taking omeprazole 40 mg nightly 30 minutes before dinner for at least 8 weeks - see you soon!

## 2022-02-06 ENCOUNTER — Other Ambulatory Visit: Payer: Self-pay

## 2022-03-22 ENCOUNTER — Ambulatory Visit: Payer: Medicaid Other | Admitting: Student

## 2022-03-22 NOTE — Progress Notes (Signed)
? ?Synopsis: Referred for precordial chest pain by Camillia Herter, NP ? ?Subjective:  ? ?PATIENT ID: Melanie Vincent GENDER: female DOB: [birth year], MRN: [id number] ? ?Chief Complaint  ?Patient presents with  ? Follow-up  ?  PFT f/u   ? ?45yF with history of anxiety, GERD, symptomatic pAF on as needed flecainide and cardizem - she easily recognizes when she's in it, former smoker, covid-19 infection referred for 2 months of precordial CP ? ?She has had intermittent sensation of a 'fullness in her chest.' Comes and goes abruptly. Comes on at rest and then resolves spontaneously however exertion also can trigger it. She has been concerned that it's a breathing issue, feels dyspneic when it happens. This sensation preceded covid-19 by about a month - got covid-19 last July. Then had cough for a little while, currently she has no cough. No trouble breathing when she lies down. She's not totally sure if there's positional component but does notice that if she leans forward it's maybe worse. Has never had a blood clot, early pregnancy losses. ? ?She is on omeprazole but not taking daily - only 1-2x per week due to symptomatic reflux. ? ?Sometimes she does have some trouble swallowing - especially solids.  ? ?Otherwise pertinent review of systems is negative. ? ?She has no family history of lung disease ? ?She is a surgical tech, CNA at [employer], does per diem. Limited exposure to dusts/solvents/particulates without a mask. Smoked for 5y in early 2000s. Occasional MJ, cbd. She has no indoor pets.  ? ? ?Past Medical History:  ?Diagnosis Date  ? Anxiety   ? Breast lump   ? Mammogram (last 2013)  ? DDD (degenerative disc disease), lumbar   ? GERD (gastroesophageal reflux disease)   ? Herpes simplex labialis   ? PAF (paroxysmal atrial fibrillation) ([place])   ?  ? ?Family History  ?Problem Relation Age of Onset  ? Diabetes Father   ? Hypertension Father   ? Stroke Father   ? Colon polyps Father   ? Breast cancer Half-Sister  91  ? Diabetes Paternal Grandmother   ? Heart disease Paternal Grandmother   ? Diabetes Paternal Grandfather   ? Heart disease Paternal Grandfather   ? Colon cancer Paternal Aunt   ?     late 77's  ? Breast cancer Maternal Aunt   ? Esophageal cancer Neg Hx   ? Rectal cancer Neg Hx   ? Stomach cancer Neg Hx   ?  ? ?Past Surgical History:  ?Procedure Laterality Date  ? CESAREAN SECTION    ? TUBAL LIGATION    ? 2008  ? ? ?Social History  ? ?Socioeconomic History  ? Marital status: Single  ?  Spouse name: Not on file  ? Number of children: 4  ? Years of education: Not on file  ? Highest education level: Not on file  ?Occupational History  ? Occupation: Caregiver  ?Tobacco Use  ? Smoking status: Former  ?  Packs/day: 0.25  ?  Years: 5.00  ?  Pack years: 1.25  ?  Types: Cigarettes  ?  Quit date: 06/15/2007  ?  Years since quitting: 14.7  ? Smokeless tobacco: Never  ?Vaping Use  ? Vaping Use: Some days  ? Substances: CBD  ? Devices: smoked CBD vape occ  ?Substance and Sexual Activity  ? Alcohol use: Yes  ?  Alcohol/week: 4.0 standard drinks  ?  Types: 2 Glasses of wine, 2 Standard drinks or equivalent per week  ?  Comment: 3 drinks per week  ? Drug use: Yes  ?  Types: Marijuana  ? Sexual activity: Yes  ?  Birth control/protection: Surgical, Condom  ?Other Topics Concern  ? Not on file  ?Social History Narrative  ? Lives in [place] with 3 children and has two others.    ? Single.  ? Works as a Radiation protection practitioner (travel)  ? ?Social Determinants of Health  ? ?Financial Resource Strain: Not on file  ?Food Insecurity: Not on file  ?Transportation Needs: Not on file  ?Physical Activity: Not on file  ?Stress: Not on file  ?Social Connections: Not on file  ?Intimate Partner Violence: Not on file  ?  ? ?No Known Allergies  ? ?Outpatient Medications Prior to Visit  ?Medication Sig Dispense Refill  ? cyclobenzaprine (FLEXERIL) 10 MG tablet Take 1 tablet (10 mg total) by mouth 3 (three) times daily as needed for muscle spasms. 90  tablet 1  ? diltiazem (CARDIZEM) 30 MG tablet Take 1 tablet every 4 hours AS NEEDED for heart rate >100 30 tablet 3  ? flecainide (TAMBOCOR) 150 MG tablet Take 1-2 tablets by mouth once for afib can only use every 4 days 6 tablet 1  ? valACYclovir (VALTREX) 1000 MG tablet Take 1 tablet (1,000 mg total) by mouth 2 (two) times daily as needed (for cold sores). 30 tablet 1  ? omeprazole (PRILOSEC) 40 MG capsule Take 1 capsule (40 mg total) by mouth daily. 90 capsule 0  ? gabapentin (NEURONTIN) 300 MG capsule Take 1 capsule (300 mg total) by mouth at bedtime. 30 capsule 0  ? ?No facility-administered medications prior to visit.  ? ? ? ? ? ?Objective:  ? ?Physical Exam: ? ?General appearance: 46 y.o., female, NAD, conversant  ?Eyes: anicteric sclerae; PERRL, tracking appropriately ?HENT: NCAT; MMM ?Neck: Trachea midline; no lymphadenopathy, no JVD ?Lungs: CTAB, no crackles, no wheeze, with normal respiratory effort ?CV: RRR, no murmur  ?Abdomen: Soft, non-tender; non-distended, BS present  ?Extremities: No peripheral edema, warm ?Skin: Normal turgor and texture; no rash ?Psych: Appropriate affect ?Neuro: Alert and oriented to person and place, no focal deficit  ? ? ? ?Vitals:  ? 03/23/22 1212  ?BP: 124/72  ?Pulse: 72  ?Temp: 97.9 ?F (36.6 ?C)  ?TempSrc: Oral  ?SpO2: 99%  ?Weight: 210 lb 9.6 oz (95.5 kg)  ?Height: 5\' 7"  (1.702 m)  ? ?99% on RA ?BMI Readings from Last 3 Encounters:  ?03/23/22 32.98 kg/m?  ?02/03/22 32.73 kg/m?  ?11/25/21 31.64 kg/m?  ? ?Wt Readings from Last 3 Encounters:  ?03/23/22 210 lb 9.6 oz (95.5 kg)  ?02/03/22 209 lb (94.8 kg)  ?11/25/21 202 lb (91.6 kg)  ? ? ? ?CBC ?   ?Component Value Date/Time  ? WBC 7.2 09/01/2021 1308  ? RBC 4.13 09/01/2021 1308  ? HGB 12.2 09/01/2021 1308  ? HGB 12.4 08/24/2020 1202  ? HCT 37.2 09/01/2021 1308  ? HCT 38.8 08/24/2020 1202  ? PLT 381 09/01/2021 1308  ? PLT 403 08/24/2020 1202  ? MCV 90.1 09/01/2021 1308  ? MCV 92 08/24/2020 1202  ? MCH 29.5 09/01/2021 1308  ?  MCHC 32.8 09/01/2021 1308  ? RDW 13.7 09/01/2021 1308  ? RDW 13.2 08/24/2020 1202  ? LYMPHSABS 2.8 08/24/2020 1202  ? MONOABS 0.6 12/24/2019 0959  ? EOSABS 0.1 08/24/2020 1202  ? BASOSABS 0.0 08/24/2020 1202  ? ? ?Eos 100 ? ?Chest Imaging: ?CTA coronaries 09/26/21 reviewed by me remarkable for RML/lingular scarring - mild in extent.  ? ?  Pulmonary Functions Testing Results: ? ?  Latest Ref Rng & Units 03/23/2022  ? 10:55 AM  ?PFT Results  ?FVC-Pre L 3.49  P  ?FVC-Predicted Pre % 104  P  ?FVC-Post L 3.47  P  ?FVC-Predicted Post % 103  P  ?Pre FEV1/FVC % % 90  P  ?Post FEV1/FCV % % 92  P  ?FEV1-Pre L 3.13  P  ?FEV1-Predicted Pre % 115  P  ?FEV1-Post L 3.19  P  ?DLCO uncorrected ml/min/mmHg 23.19  P  ?DLCO UNC% % 97  P  ?DLCO corrected ml/min/mmHg 23.19  P  ?DLCO COR %Predicted % 97  P  ?DLVA Predicted % 112  P  ?TLC L 5.05  P  ?TLC % Predicted % 91  P  ?RV % Predicted % 82  P  ?  ?P Preliminary result  ?Reviewed by me unremarkable ? ?PSG 2017 AHI 2.7 ?, O2 nadir of 92% ? ?Echocardiogram:  ? ?TTE 03/2020: ? 1. Left ventricular ejection fraction, by estimation, is 60 to 65%. The  ?left ventricle has normal function. The left ventricle has no regional  ?wall motion abnormalities. Left ventricular diastolic parameters were  ?normal.  ? 2. Right ventricular systolic function is normal. The right ventricular  ?size is normal. There is normal pulmonary artery systolic pressure.  ? 3. The mitral valve is abnormal. Trivial mitral valve regurgitation.  ? 4. The aortic valve is tricuspid. Aortic valve regurgitation is not  ?visualized.  ? 5. The inferior vena cava is normal in size with greater than 50%  ?respiratory variability, suggesting right atrial pressure of 3 mmHg.  ? ?   ?Assessment & Plan:  ? ?# Atypical chest pain: ?With her associated dyspnea and occasional wheeze she is worried about the possibility of asthma but this has subsided a bit with more consistent use of PPI and PFTs don't show any evidence of asthma.  Covid-19 related small airways disease is possible, covid-19 related ILD not present with normal CTA lung bases. No pericardial effusion on coronary CTA, doubt pericarditis and nature of pain not clearly peri

## 2022-03-23 ENCOUNTER — Encounter: Payer: Self-pay | Admitting: Student

## 2022-03-23 ENCOUNTER — Ambulatory Visit (INDEPENDENT_AMBULATORY_CARE_PROVIDER_SITE_OTHER): Payer: Medicaid Other | Admitting: Student

## 2022-03-23 ENCOUNTER — Ambulatory Visit: Payer: Medicaid Other | Admitting: Student

## 2022-03-23 VITALS — BP 124/72 | HR 72 | Temp 97.9°F | Ht 67.0 in | Wt 210.6 lb

## 2022-03-23 DIAGNOSIS — R0789 Other chest pain: Secondary | ICD-10-CM

## 2022-03-23 DIAGNOSIS — K219 Gastro-esophageal reflux disease without esophagitis: Secondary | ICD-10-CM

## 2022-03-23 LAB — PULMONARY FUNCTION TEST
DL/VA % pred: 112 %
DL/VA: 4.81 ml/min/mmHg/L
DLCO cor % pred: 97 %
DLCO cor: 23.19 ml/min/mmHg
DLCO unc % pred: 97 %
DLCO unc: 23.19 ml/min/mmHg
FEF 25-75 Post: 5.71 L/sec
FEF 25-75 Pre: 5.04 L/sec
FEF2575-%Change-Post: 13 %
FEF2575-%Pred-Post: 199 %
FEF2575-%Pred-Pre: 176 %
FEV1-%Change-Post: 1 %
FEV1-%Pred-Post: 117 %
FEV1-%Pred-Pre: 115 %
FEV1-Post: 3.19 L
FEV1-Pre: 3.13 L
FEV1FVC-%Change-Post: 2 %
FEV1FVC-%Pred-Pre: 108 %
FEV6-%Change-Post: 0 %
FEV6-%Pred-Post: 106 %
FEV6-%Pred-Pre: 106 %
FEV6-Post: 3.47 L
FEV6-Pre: 3.49 L
FEV6FVC-%Pred-Post: 102 %
FEV6FVC-%Pred-Pre: 102 %
FVC-%Change-Post: 0 %
FVC-%Pred-Post: 103 %
FVC-%Pred-Pre: 104 %
FVC-Post: 3.47 L
FVC-Pre: 3.49 L
Post FEV1/FVC ratio: 92 %
Post FEV6/FVC ratio: 100 %
Pre FEV1/FVC ratio: 90 %
Pre FEV6/FVC Ratio: 100 %
RV % pred: 82 %
RV: 1.52 L
TLC % pred: 91 %
TLC: 5.05 L

## 2022-03-23 MED ORDER — PANTOPRAZOLE SODIUM 40 MG PO PACK: 40.0000 mg | PACK | Freq: Every day | ORAL | 2 refills | Status: DC

## 2022-03-23 NOTE — Progress Notes (Signed)
PFT done today. 

## 2022-03-23 NOTE — Patient Instructions (Addendum)
-   Referral placed to GI today ?- Take protonix 40 mg daily 30 minutes before meal ?- if shortness of breath, wheeze come back happy to see you in clinic again to consider more advanced testing for asthma (methacholine challenge) ? ?

## 2022-03-24 ENCOUNTER — Other Ambulatory Visit (HOSPITAL_COMMUNITY): Payer: Self-pay

## 2022-03-24 ENCOUNTER — Telehealth: Payer: Self-pay

## 2022-03-24 NOTE — Telephone Encounter (Signed)
Patient Advocate Encounter ?  ?Received notification from [facility] that prior authorization for Protonix 40mg  packets is required by his/her insurance [employer]. ?  ?PA submitted on 03/24/22 ? ?Key#: [id number] ? ?Status is pending ?   ?[facility] will continue to follow: ? ?Patient Advocate ?Fax: [phone]  ?

## 2022-03-24 NOTE — Telephone Encounter (Signed)
Patient Advocate Encounter ? ?Prior Authorization for Protonix 40mg  packets has been approved.   ? ?PA# ? ?Effective dates: 03/24/22 through 03/24/23 ? ?Per Test Claim Patients co-pay is $4.  ? ?Spoke with Pharmacy to Process. ? ?Patient Advocate ?Fax: [phone]  ?

## 2022-03-27 ENCOUNTER — Other Ambulatory Visit (HOSPITAL_COMMUNITY): Payer: Self-pay

## 2022-05-12 ENCOUNTER — Ambulatory Visit: Payer: Self-pay | Admitting: *Deleted

## 2022-05-12 NOTE — Telephone Encounter (Signed)
  Chief Complaint: numbness in hands Symptoms: numb since COVID vaccine one year ago Frequency: constant Pertinent Negatives: Patient denies na Disposition: [] ED /[] Urgent Care (no appt availability in office) / [x] Appointment(In office/virtual)/ []  [place] [facility]/ [] Home Care/ [] Refused Recommended Disposition /[]  Mobile Bus/ []  Follow-up with PCP Additional Notes: First appt in practice June 26. Do have pt on wait list. Has had the problems for a year.Pt states that if she can not wait for appt she will go to ED.  Reason for Disposition  [1] Numbness or tingling on both sides of body AND [2] is a new symptom present > 24 hours  Answer Assessment - Initial Assessment Questions 1. SYMPTOM: "What is the main symptom you are concerned about?" (e.g., weakness, numbness)     Numb hands 2. ONSET: "When did this start?" (minutes, hours, days; while sleeping)     One year after COVID vaccine 3. LAST NORMAL: "When was the last time you (the patient) were normal (no symptoms)?"     One year ago before vaccine 4. PATTERN "Does this come and go, or has it been constant since it started?"  "Is it present now?"     No, constant, worse at night 5. CARDIAC SYMPTOMS: "Have you had any of the following symptoms: chest pain, difficulty breathing, palpitations?"     Hx afib 6. NEUROLOGIC SYMPTOMS: "Have you had any of the following symptoms: headache, dizziness, vision loss, double vision, changes in speech, unsteady on your feet?"     Vision changed since vaccine 7. OTHER SYMPTOMS: "Do you have any other symptoms?"     Sensitive to light 8. PREGNANCY: "Is there any chance you are pregnant?" "When was your last menstrual period?"     na  Protocols used: Neurologic Deficit-A-AH

## 2022-05-17 ENCOUNTER — Other Ambulatory Visit: Payer: Self-pay | Admitting: Family

## 2022-05-17 ENCOUNTER — Ambulatory Visit: Payer: Self-pay

## 2022-05-17 DIAGNOSIS — M25521 Pain in right elbow: Secondary | ICD-10-CM

## 2022-05-17 MED ORDER — GABAPENTIN 300 MG PO CAPS: 300.0000 mg | ORAL_CAPSULE | Freq: Every day | ORAL | 0 refills | Status: DC

## 2022-05-17 NOTE — Telephone Encounter (Signed)
Medication Refill - Medication: gabapentin (NEURONTIN) 300 MG capsule   When I asked if this was a new symptom, pt said it was the same pain she had when she came in last year.   Has the patient contacted their pharmacy? No. No, more refills.   (Agent: If no, request that the patient contact the pharmacy for the refill. If patient does not wish to contact the pharmacy document the reason why and proceed with request.)   Preferred Pharmacy (with phone number or street name):  [id number] - [place], [place] - [address]  [address] [place] [address]  Phone: [phone] Fax: [phone]  Hours: Not open 24 hours   Has the patient been seen for an appointment in the last year OR does the patient have an upcoming appointment? Yes.    Agent: Please be advised that RX refills may take up to 3 business days. We ask that you follow-up with your pharmacy.

## 2022-05-17 NOTE — Telephone Encounter (Signed)
  Chief Complaint: Numbness /tingling/pain in rt hand. Now intermittent numbness in legs/whole body. Symptoms: ibid Frequency: Hand for 1 year. Legs/whole body about 1 month. Pertinent Negatives: Patient denies Chest pain, SOB Disposition: [] ED /[x] Urgent Care (no appt availability in office) / [] Appointment(In office/virtual)/ []  [place] [facility]/ [] Home Care/ [] Refused Recommended Disposition /[] [place] Mobile [facility]/ []  Follow-up with PCP Additional Notes: PT will wait for upcoming appointment, but will seek care at ED if needed. Reason for Disposition  [1] Numbness (i.e., loss of sensation) of the face, arm / hand, or leg / foot on one side of the body AND [2] gradual onset (e.g., days to weeks) AND [3] present now  Answer Assessment - Initial Assessment Questions 1. SYMPTOM: "What is the main symptom you are concerned about?" (e.g., weakness, numbness)     Numbness in rt hand.  2. ONSET: "When did this start?" (minutes, hours, days; while sleeping)     Hand is 1 year - but has gotten worse. Now numbness is everyday. Whole body and legs 4 weeks ago - intermittent 3. LAST NORMAL: "When was the last time you (the patient) were normal (no symptoms)?"     1 year 4. PATTERN "Does this come and go, or has it been constant since it started?"  "Is it present now?"     Whole body/legs is intermittent 5. CARDIAC SYMPTOMS: "Have you had any of the following symptoms: chest pain, difficulty breathing, palpitations?"     no 6. NEUROLOGIC SYMPTOMS: "Have you had any of the following symptoms: headache, dizziness, vision loss, double vision, changes in speech, unsteady on your feet?"     HA, vision changes 7. OTHER SYMPTOMS: "Do you have any other symptoms?"     no 8. PREGNANCY: "Is there any chance you are pregnant?" "When was your last menstrual period?"     na  Protocols used: Neurologic Deficit-A-AH

## 2022-05-17 NOTE — Telephone Encounter (Signed)
Requested medication (s) are due for refill today: yes  Requested medication (s) are on the active medication list: Prescription ended 02/03/22  Last refill:  12/23/21   Future visit scheduled: yes  Notes to clinic:  Please review if refill appropriate   Requested Prescriptions  Pending Prescriptions Disp Refills   gabapentin (NEURONTIN) 300 MG capsule 30 capsule 0    Sig: Take 1 capsule (300 mg total) by mouth at bedtime.     Neurology: Anticonvulsants - gabapentin Passed - 05/17/2022 11:59 AM      Passed - Cr in normal range and within 360 days    Creat  Date Value Ref Range Status  05/02/2013 0.76 0.50 - 1.10 mg/dL Final   Creatinine, Ser  Date Value Ref Range Status  09/01/2021 0.64 0.44 - 1.00 mg/dL Final         Passed - Completed PHQ-2 or PHQ-9 in the last 360 days      Passed - Valid encounter within last 12 months    Recent Outpatient Visits           4 months ago Right elbow pain   Primary Care at [facility], Amy J, NP   11 months ago Annual physical exam   Primary Care at [facility], [place], NP   1 year ago Encounter to establish care   Primary Care at [facility], Carroll Sage, [place]   3 years ago Chronic RUQ pain   Primary Care at [facility], Carroll Sage, [place]   3 years ago Encounter to establish care   Primary Care at [facility], Carroll Sage, [place]             In 1 week Camillia Herter, NP Primary Care at [facility]

## 2022-05-17 NOTE — Telephone Encounter (Signed)
Requested Prescriptions  Pending Prescriptions Disp Refills  . gabapentin (NEURONTIN) 300 MG capsule 30 capsule 0    Sig: Take 1 capsule (300 mg total) by mouth at bedtime.     Neurology: Anticonvulsants - gabapentin Passed - 05/17/2022 11:59 AM      Passed - Cr in normal range and within 360 days    Creat  Date Value Ref Range Status  05/02/2013 0.76 0.50 - 1.10 mg/dL Final   Creatinine, Ser  Date Value Ref Range Status  09/01/2021 0.64 0.44 - 1.00 mg/dL Final         Passed - Completed PHQ-2 or PHQ-9 in the last 360 days      Passed - Valid encounter within last 12 months    Recent Outpatient Visits          4 months ago Right elbow pain   Primary Care at [facility], Amy J, NP   11 months ago Annual physical exam   Primary Care at [facility], [place], NP   1 year ago Encounter to establish care   Primary Care at [facility], Carroll Sage, [place]   3 years ago Chronic RUQ pain   Primary Care at [facility], Carroll Sage, [place]   3 years ago Encounter to establish care   Primary Care at [facility], Carroll Sage, [place]            In 1 week Camillia Herter, NP Primary Care at [facility]

## 2022-05-18 NOTE — Progress Notes (Unsigned)
Patient ID: KARRINE FIGARO, female    DOB: 09-17-1976  MRN: [id number]  CC: Numbness   Subjective: Melanie Vincent is a 46 y.o. female who presents for numbness.   Her concerns today include:  - Reports bilateral hand numbness worsening for months. Recently noticed sharp pain in feet as well. Gabapentin helps some. - Acid reflux worsening. Omeprazole makes worse. Feels foods/beverages stuck center of chest. She is established with Gastroenterology.  - Reports irregular periods. Sometimes will go months without period and then may have spotting. Experiencing hot flashes. She does have bilateral tubal ligation history. Reports her mother has menopause around 71 years-old.  - Would like to begin [facility] for weight loss. Does have family history of type 1 diabetic father and several cousins with the same diagnosis. She does have atrial fibrillation so unable to take Phentermine.     Patient Active Problem List   Diagnosis Date Noted   Bacterial vaginitis 05/26/2021   Lumbar degenerative disc disease 05/25/2021   Educated about COVID-19 virus infection 06/13/2020   Precordial chest pain 06/13/2020   Sleep apnea suspected 10/06/2015   Anxiety reaction 09/15/2015   PAF (paroxysmal atrial fibrillation) (HCC) 11/25/2014   Herpes labialis 07/29/2013   Well woman exam (no gynecological exam) 05/02/2013   GERD (gastroesophageal reflux disease) 11/13/2012   Overweight 06/14/2012     Current Outpatient Medications on File Prior to Visit  Medication Sig Dispense Refill   cyclobenzaprine (FLEXERIL) 10 MG tablet Take 1 tablet (10 mg total) by mouth 3 (three) times daily as needed for muscle spasms. 90 tablet 1   diltiazem (CARDIZEM) 30 MG tablet Take 1 tablet every 4 hours AS NEEDED for heart rate >100 30 tablet 3   flecainide (TAMBOCOR) 150 MG tablet Take 1-2 tablets by mouth once for afib can only use every 4 days 6 tablet 1   gabapentin (NEURONTIN) 300 MG capsule Take 1 capsule (300 mg  total) by mouth at bedtime. 30 capsule 0   pantoprazole sodium (PROTONIX) 40 mg Place 40 mg into feeding tube daily. 30 packet 2   valACYclovir (VALTREX) 1000 MG tablet Take 1 tablet (1,000 mg total) by mouth 2 (two) times daily as needed (for cold sores). 30 tablet 1   No current facility-administered medications on file prior to visit.    No Known Allergies  Social History   Socioeconomic History   Marital status: Single    Spouse name: Not on file   Number of children: 4   Years of education: Not on file   Highest education level: Not on file  Occupational History   Occupation: Caregiver  Tobacco Use   Smoking status: Former    Packs/day: 0.25    Years: 5.00    Total pack years: 1.25    Types: Cigarettes    Quit date: 06/15/2007    Years since quitting: 14.9    Passive exposure: Past   Smokeless tobacco: Never  Vaping Use   Vaping Use: Some days   Substances: CBD   Devices: smoked CBD vape occ  Substance and Sexual Activity   Alcohol use: Yes    Alcohol/week: 4.0 standard drinks of alcohol    Types: 2 Glasses of wine, 2 Standard drinks or equivalent per week    Comment: 3 drinks per week   Drug use: Yes    Types: Marijuana   Sexual activity: Yes    Birth control/protection: Surgical, Condom  Other Topics Concern   Not on file  Social  History Narrative   Lives in [place] with 3 children and has two others.     Single.   Works as a Radiation protection practitioner (travel)   Social Determinants of Sales executive: Not on Art therapist Insecurity: Not on file  Transportation Needs: Not on file  Physical Activity: Not on file  Stress: Not on file  Social Connections: Not on file  Intimate Partner Violence: Not on file    Family History  Problem Relation Age of Onset   Diabetes Father    Hypertension Father    Stroke Father    Colon polyps Father    Breast cancer Half-Sister 42   Diabetes Paternal Grandmother    Heart disease Paternal  Grandmother    Diabetes Paternal Grandfather    Heart disease Paternal Grandfather    Colon cancer Paternal Aunt        late 70's   Breast cancer Maternal Aunt    Esophageal cancer Neg Hx    Rectal cancer Neg Hx    Stomach cancer Neg Hx     Past Surgical History:  Procedure Laterality Date   CESAREAN SECTION     TUBAL LIGATION     2008    ROS: Review of Systems Negative except as stated above  PHYSICAL EXAM: BP 118/79 (BP Location: Left Arm, Patient Position: Sitting, Cuff Size: Large)   Pulse 76   Temp 98.3 F (36.8 C)   Resp 18   Ht 5' 7.01" (1.702 m)   Wt 206 lb (93.4 kg)   SpO2 98%   BMI 32.26 kg/m   Physical Exam HENT:     Head: Normocephalic and atraumatic.  Eyes:     Extraocular Movements: Extraocular movements intact.     Conjunctiva/sclera: Conjunctivae normal.     Pupils: Pupils are equal, round, and reactive to light.  Cardiovascular:     Rate and Rhythm: Normal rate and regular rhythm.     Pulses: Normal pulses.     Heart sounds: Normal heart sounds.  Pulmonary:     Effort: Pulmonary effort is normal.     Breath sounds: Normal breath sounds.  Musculoskeletal:     Cervical back: Normal range of motion and neck supple.  Neurological:     General: No focal deficit present.     Mental Status: She is alert and oriented to person, place, and time.  Psychiatric:        Mood and Affect: Mood normal.        Behavior: Behavior normal.    ASSESSMENT AND PLAN: 1. Neuropathic pain 2. Pain in both feet 3. Bilateral carpal tunnel syndrome - Increase Gabapentin from 300 mg daily to 600 mg daily. No refills needed as of present. Patient to take two 300 mg tablets to equal new dose.  - Referral to Neurology for further evaluation and management.  - Ambulatory referral to Neurology  4. Gastroesophageal reflux disease, unspecified whether esophagitis present - Discontinue Omeprazole as patient reports this worsens acid reflux.  - Sucralfate as prescribed.  Counseled on medication adherence and adverse effects.  - Keep appointment scheduled 06/16/2022 with [facility].  - sucralfate (CARAFATE) 1 GM/10ML suspension; Take 10 mLs (1 g total) by mouth 4 (four) times daily -  with meals and at bedtime.  Dispense: 420 mL; Refill: 3  5. Perimenopause 6. Perimenopausal vasomotor symptoms - Checking hormone levels. - Follicle stimulating hormone - Luteinizing hormone  7. Prediabetes - Update hemoglobin A1c. - Hemoglobin A1c  8. Encounter for weight management - Counseled Moujaro reserved for diabetic patients.  - Unable to prescribe Phentermine and Saxenda considering patient's history of atrial fibrillation.  - Discussed when patient ready we may consider beginning Wegovy. However, currently this is on backorder.  - Follow-up with primary provider as scheduled.   Patient was given the opportunity to ask questions.  Patient verbalized understanding of the plan and was able to repeat key elements of the plan. Patient was given clear instructions to go to Emergency Department or return to medical center if symptoms don't improve, worsen, or new problems develop.The patient verbalized understanding.   Orders Placed This Encounter  Procedures   Follicle stimulating hormone   Luteinizing hormone   Hemoglobin A1c   Ambulatory referral to Neurology     Requested Prescriptions   Signed Prescriptions Disp Refills   sucralfate (CARAFATE) 1 GM/10ML suspension 420 mL 3    Sig: Take 10 mLs (1 g total) by mouth 4 (four) times daily -  with meals and at bedtime.    Follow-up with primary provider as scheduled.  Camillia Herter, NP

## 2022-05-22 ENCOUNTER — Encounter (HOSPITAL_COMMUNITY): Payer: Self-pay

## 2022-05-22 ENCOUNTER — Emergency Department (HOSPITAL_COMMUNITY)
Admission: EM | Admit: 2022-05-22 | Discharge: 2022-05-22 | Discharge status: Against Medical Advice | Payer: Medicaid Other | Attending: Emergency Medicine | Admitting: Emergency Medicine

## 2022-05-22 ENCOUNTER — Other Ambulatory Visit: Payer: Self-pay

## 2022-05-22 DIAGNOSIS — R0989 Other specified symptoms and signs involving the circulatory and respiratory systems: Secondary | ICD-10-CM | POA: Diagnosis present

## 2022-05-22 DIAGNOSIS — Z5321 Procedure and treatment not carried out due to patient leaving prior to being seen by health care provider: Secondary | ICD-10-CM | POA: Insufficient documentation

## 2022-05-22 LAB — CBC WITH DIFFERENTIAL/PLATELET
Abs Immature Granulocytes: 0.02 10*3/uL (ref 0.00–0.07)
Basophils Absolute: 0 10*3/uL (ref 0.0–0.1)
Basophils Relative: 0 %
Eosinophils Absolute: 0.1 10*3/uL (ref 0.0–0.5)
Eosinophils Relative: 2 %
HCT: 38.6 % (ref 36.0–46.0)
Hemoglobin: 12.4 g/dL (ref 12.0–15.0)
Immature Granulocytes: 0 %
Lymphocytes Relative: 28 %
Lymphs Abs: 2.2 10*3/uL (ref 0.7–4.0)
MCH: 29.2 pg (ref 26.0–34.0)
MCHC: 32.1 g/dL (ref 30.0–36.0)
MCV: 90.8 fL (ref 80.0–100.0)
Monocytes Absolute: 0.5 10*3/uL (ref 0.1–1.0)
Monocytes Relative: 7 %
Neutro Abs: 4.9 10*3/uL (ref 1.7–7.7)
Neutrophils Relative %: 63 %
Platelets: 358 10*3/uL (ref 150–400)
RBC: 4.25 MIL/uL (ref 3.87–5.11)
RDW: 14.4 % (ref 11.5–15.5)
WBC: 7.8 10*3/uL (ref 4.0–10.5)
nRBC: 0 % (ref 0.0–0.2)

## 2022-05-22 LAB — BASIC METABOLIC PANEL
Anion gap: 7 (ref 5–15)
BUN: 15 mg/dL (ref 6–20)
CO2: 21 mmol/L — ABNORMAL LOW (ref 22–32)
Calcium: 8.9 mg/dL (ref 8.9–10.3)
Chloride: 110 mmol/L (ref 98–111)
Creatinine, Ser: 0.72 mg/dL (ref 0.44–1.00)
GFR, Estimated: >60 mL/min (ref >60)
Glucose, Bld: 99 mg/dL (ref 70–99)
Potassium: 4.1 mmol/L (ref 3.5–5.1)
Sodium: 138 mmol/L (ref 135–145)

## 2022-05-22 MED ORDER — ALUM & MAG HYDROXIDE-SIMETH 200-200-20 MG/5ML PO SUSP
30.0000 mL | Freq: Once | ORAL | Status: AC
  Administered 2022-05-22: 30 mL via ORAL
  Filled 2022-05-22: qty 30

## 2022-05-22 MED ORDER — LIDOCAINE VISCOUS HCL 2 % MT SOLN
15.0000 mL | Freq: Once | OROMUCOSAL | Status: AC
  Administered 2022-05-22: 15 mL via ORAL
  Filled 2022-05-22: qty 15

## 2022-05-22 NOTE — ED Triage Notes (Signed)
Reports a globus sensation that began this morning. Suspected she had some acid reflux and took omeprazole. Afterwards has had difficulty swallowing food and water.

## 2022-05-22 NOTE — ED Provider Triage Note (Signed)
  Emergency Medicine Provider Triage Evaluation Note  MRN:  [id number]  Arrival date & time: 05/22/22    Medically screening exam initiated at 2:05 AM.   CC:   Globus   HPI:  Melanie Vincent is a 46 y.o. year-old female presents to the ED with chief complaint of FB sensation in throat.  States she has bad acid reflux and that it was that, but then ate some steak and states that she's not been able to keep anything down since then including water.  History provided by History provided by patient. ROS:  -As included in HPI PE:   Vitals:   05/22/22 0145  BP: 118/80  Pulse: 89  Resp: 16  Temp: 98.9 F (37.2 C)  SpO2: 100%    Non-toxic appearing No respiratory distress  MDM:  Based on signs and symptoms, esophageal stricture vs food bolus is highest on my differential. I've ordered gi cocktail in triage to expedite lab/diagnostic workup.  Patient was informed that the remainder of the evaluation will be completed by another provider, this initial triage assessment does not replace that evaluation, and the importance of remaining in the ED until their evaluation is complete.    Roxy Horseman, PA-C 05/22/22 0207

## 2022-05-22 NOTE — ED Notes (Signed)
PATIENT WAS CALL FOR RECHECK VITALSIGNS NO ANSWER  X4

## 2022-05-23 ENCOUNTER — Ambulatory Visit: Payer: Self-pay | Admitting: *Deleted

## 2022-05-23 NOTE — Telephone Encounter (Signed)
  Chief Complaint: Dysphagia Symptoms:Reports H/O acid reflux, "Acid reflux, worse ever." Could not eat or swallow water, painful. Seen in ED Sunday, "Gave me a GI cocktail, Mylanta, lidocaine and left me in the waiting room, I left." Able to eat and drink today.  Frequency: Onset Sunday "After I took the omeprazole, seemed to make it worse." States feels like "A bubble at my esophagus, top of stomach."  Pertinent Negatives: Patient denies  Disposition: [] ED /[] Urgent Care (no appt availability in office) / [x] Appointment(In office/virtual)/ []  [place] Virtual [facility]/ [] Home Care/ [] Refused Recommended Disposition /[] [place] Mobile Bus/ []  Follow-up with PCP Additional Notes: Pt has appt tomorrow for other issue (Numbness, tingling) Requesting to be seen today. Placed on wait list. Advised ED for worsening symptoms.  Reason for Disposition  [1] Swallowing difficulty AND [2] cause unknown (Exception: difficulty swallowing is a chronic symptom)  Answer Assessment - Initial Assessment Questions 1. SYMPTOM: "Are you having difficulty swallowing liquids, solids, or both?"     Before ED visit , water. Able to eat today and drink fluids 2. ONSET: "When did the swallowing problems begin?"      Acid reflux x 10 years 3. CAUSE: "What do you think is causing the problem?"      Unsure, acid reflux 4. CHRONIC/RECURRENT: "Is this a new problem for you?"  If no, ask: "How long have you had this problem?" (e.g., days, weeks, months)      The swallowing difficulty is new. 5. OTHER SYMPTOMS: "Do you have any other symptoms?" (e.g., difficulty breathing, sore throat, swollen tongue, chest pain)     Chest hurts with movement.  Protocols used: Swallowing Difficulty-A-AH

## 2022-05-24 ENCOUNTER — Encounter: Payer: Self-pay | Admitting: Family

## 2022-05-24 ENCOUNTER — Ambulatory Visit: Payer: Medicaid Other | Admitting: Family

## 2022-05-24 VITALS — BP 118/79 | HR 76 | Temp 98.3°F | Resp 18 | Ht 67.01 in | Wt 206.0 lb

## 2022-05-24 DIAGNOSIS — G5603 Carpal tunnel syndrome, bilateral upper limbs: Secondary | ICD-10-CM

## 2022-05-24 DIAGNOSIS — Z7689 Persons encountering health services in other specified circumstances: Secondary | ICD-10-CM

## 2022-05-24 DIAGNOSIS — K219 Gastro-esophageal reflux disease without esophagitis: Secondary | ICD-10-CM

## 2022-05-24 DIAGNOSIS — R7303 Prediabetes: Secondary | ICD-10-CM

## 2022-05-24 DIAGNOSIS — M79671 Pain in right foot: Secondary | ICD-10-CM

## 2022-05-24 DIAGNOSIS — M792 Neuralgia and neuritis, unspecified: Secondary | ICD-10-CM

## 2022-05-24 DIAGNOSIS — M79672 Pain in left foot: Secondary | ICD-10-CM | POA: Diagnosis not present

## 2022-05-24 DIAGNOSIS — N951 Menopausal and female climacteric states: Secondary | ICD-10-CM

## 2022-05-24 MED ORDER — SUCRALFATE 1 GM/10ML PO SUSP: 1.0000 g | Freq: Three times a day (TID) | ORAL | 3 refills | Status: DC

## 2022-05-24 NOTE — Progress Notes (Signed)
Pt presents for right hand tingling and numbness states going on for about month and feeling it in her feet.

## 2022-05-25 ENCOUNTER — Other Ambulatory Visit: Payer: Self-pay | Admitting: Family

## 2022-05-25 DIAGNOSIS — N926 Irregular menstruation, unspecified: Secondary | ICD-10-CM

## 2022-05-25 DIAGNOSIS — N951 Menopausal and female climacteric states: Secondary | ICD-10-CM

## 2022-05-25 LAB — HEMOGLOBIN A1C
Est. average glucose Bld gHb Est-mCnc: 120 mg/dL
Hgb A1c MFr Bld: 5.8 % — ABNORMAL HIGH (ref 4.8–5.6)

## 2022-05-25 LAB — FOLLICLE STIMULATING HORMONE: FSH: 47.8 m[IU]/mL

## 2022-05-25 LAB — LUTEINIZING HORMONE: LH: 32.3 m[IU]/mL

## 2022-06-05 ENCOUNTER — Ambulatory Visit: Payer: Medicaid Other | Admitting: Family

## 2022-06-07 ENCOUNTER — Telehealth: Payer: Self-pay | Admitting: *Deleted

## 2022-06-07 ENCOUNTER — Ambulatory Visit: Payer: Self-pay

## 2022-06-07 ENCOUNTER — Ambulatory Visit: Payer: Medicaid Other | Admitting: Family Medicine

## 2022-06-07 NOTE — Telephone Encounter (Signed)
Patient was called and LVM to give a call to  for appt. Patient was advise that they have been trying to reach her on the phone  [facility]  [address]  [place], [place] [address] Ph# [phone]

## 2022-06-07 NOTE — Telephone Encounter (Signed)
  Chief Complaint: tingling and numbness to arms and in legs and feet Symptoms: feels like pins are pricking her, rust colored palms, constipation, acid reflux, palpitations, occasional stumbling, feels like it may be hypothyroidism Frequency: for a year or more Pertinent Negatives: Patient denies SOB or chest pain,changes in speech,dizziness,vision changes Disposition: [] ED /[] Urgent Care (no appt availability in office) / [x] Appointment(In office/virtual)/ []  [place] [facility]/ [] Home Care/ [] Refused Recommended Disposition /[] [place] Mobile [facility]/ []  Follow-up with PCP Additional Notes: pt requested neurologist consult and Protonix for acid reflux Appt today at 1:20 pm With Dr.  Reason for Disposition  [1] Numbness or tingling in one or both hands AND [2] is a chronic symptom (recurrent or ongoing AND present > 4 weeks)  Answer Assessment - Initial Assessment Questions 1. SYMPTOM: "What is the main symptom you are concerned about?" (e.g., weakness, numbness)     Numbness and tingling 2. ONSET: "When did this start?" (minutes, hours, days; while sleeping)     1 year 3. LAST NORMAL: "When was the last time you (the patient) were normal (no symptoms)?"     > 1 year 4. PATTERN "Does this come and go, or has it been constant since it started?"  "Is it present now?"     Constant- yes 5. CARDIAC SYMPTOMS: "Have you had any of the following symptoms: chest pain, difficulty breathing, palpitations?"    Palpitations "felt like it was beating fast" stopped Valsalva man 6. NEUROLOGIC SYMPTOMS: "Have you had any of the following symptoms: headache, dizziness, vision loss, double vision, changes in speech, unsteady on your feet?"     Occasional "stumbling" 7. OTHER SYMPTOMS: "Do you have any other symptoms?"     Discoloration of palms of hands looks like rust  Protocols used: Neurologic Deficit-A-AH

## 2022-06-16 ENCOUNTER — Ambulatory Visit: Payer: Medicaid Other | Admitting: Gastroenterology

## 2022-06-16 ENCOUNTER — Telehealth: Payer: Self-pay | Admitting: Student

## 2022-06-16 MED ORDER — PANTOPRAZOLE SODIUM 40 MG PO TBEC: 40.0000 mg | DELAYED_RELEASE_TABLET | Freq: Every day | ORAL | 5 refills | Status: DC

## 2022-06-16 NOTE — Telephone Encounter (Signed)
I called and spoke with pharm  The rx for protonix that was last sent was for powder form and pt needs tablet  I gave VO for tablets  Nothing further needed

## 2022-06-20 ENCOUNTER — Telehealth: Payer: Self-pay | Admitting: Neurology

## 2022-06-20 ENCOUNTER — Ambulatory Visit: Payer: Medicaid Other | Admitting: Neurology

## 2022-06-20 NOTE — Telephone Encounter (Signed)
Pt cancelled appt due to having car trouble, van smoking.

## 2022-07-07 ENCOUNTER — Ambulatory Visit (INDEPENDENT_AMBULATORY_CARE_PROVIDER_SITE_OTHER): Payer: Medicaid Other | Admitting: Nurse Practitioner

## 2022-07-07 ENCOUNTER — Encounter: Payer: Self-pay | Admitting: Nurse Practitioner

## 2022-07-07 VITALS — BP 122/70 | HR 70 | Ht 67.0 in | Wt 210.0 lb

## 2022-07-07 DIAGNOSIS — R1013 Epigastric pain: Secondary | ICD-10-CM

## 2022-07-07 DIAGNOSIS — K2100 Gastro-esophageal reflux disease with esophagitis, without bleeding: Secondary | ICD-10-CM | POA: Diagnosis not present

## 2022-07-07 NOTE — Patient Instructions (Addendum)
1) Avoid large pieces of meat and bread, cut food into small pieces, chew food thoroughly. Drink 3 sips of water before swallowing any food or tablets.  2) Contact our office if your symptoms worsen   3) Contact our office and follow up with your cardiologist if you have any further episodes of afib  4) Take Miralax 1 capful mixed in 8 ounces of water as needed for constipation   You will be contacted by [facility] Scheduling in the next 2 days to arrange a Barium Swallow .  The number on your caller ID will be [phone], please answer when they call.  If you have not heard from them in 2 days please call [phone] to schedule.     You have been scheduled for a Barium Esophogram at [facility] (1st floor of the hospital) on ________ at _______. Please arrive 15 minutes prior to your appointment for registration. Make certain not to have anything to eat or drink 3 hours prior to your test. If you need to reschedule for any reason, please contact radiology at [phone] to do so. __________________________________________________________________ A barium swallow is an examination that concentrates on views of the esophagus. This tends to be a double contrast exam (barium and two liquids which, when combined, create a gas to distend the wall of the oesophagus) or single contrast (non-ionic iodine based). The study is usually tailored to your symptoms so a good history is essential. Attention is paid during the study to the form, structure and configuration of the esophagus, looking for functional disorders (such as aspiration, dysphagia, achalasia, motility and reflux) EXAMINATION You may be asked to change into a gown, depending on the type of swallow being performed. A radiologist and radiographer will perform the procedure. The radiologist will advise you of the type of contrast selected for your procedure and direct you during the exam. You will be asked to stand, sit or  lie in several different positions and to hold a small amount of fluid in your mouth before being asked to swallow while the imaging is performed .In some instances you may be asked to swallow barium coated marshmallows to assess the motility of a solid food bolus. The exam can be recorded as a digital or video fluoroscopy procedure. POST PROCEDURE It will take 1-2 days for the barium to pass through your system. To facilitate this, it is important, unless otherwise directed, to increase your fluids for the next 24-48hrs and to resume your normal diet.  This test typically takes about 30 minutes to perform.  You have been scheduled for an endoscopy. Please follow written instructions given to you at your visit today. If you use inhalers (even only as needed), please bring them with you on the day of your procedure.  __________________________________________________________________________________   Due to recent changes in healthcare laws, you may see the results of your imaging and laboratory studies on MyChart before your provider has had a chance to review them.  We understand that in some cases there may be results that are confusing or concerning to you. Not all laboratory results come back in the same time frame and the provider may be waiting for multiple results in order to interpret others.  Please give [place] 48 hours in order for your provider to thoroughly review all the results before contacting the office for clarification of your results.    I appreciate the  opportunity to care for you  Thank You   Emily Filbert

## 2022-07-07 NOTE — Progress Notes (Signed)
07/07/2022 [name] [id number] [birth year]   Chief Complaint: Difficulty swallowing  History of Present Illness: Melanie Vincent is a 46 year old female with a past medical history of paroxysmal atrial fibrillation, atypical chest pain, H. Pylori x 2 and colitis per CT 2017. Past tubal ligation and C. section. She is followed by Dr. Orvan Falconer.  She presents today for further evaluation regarding dysphagia.  She describes food slowly passes down the esophagus and then gets stuck briefly to the upper mid esophagus which occurs with solid foods and liquids which started 1 year ago but has progressively worsened over the past 3 months.  She waits a few seconds or drinks water and the stuck food passes down the esophagus.  She denies gagging or vomiting up the stuck food.  Any thick foods, steak or bread tend to be problematic.  She is also experiencing more heartburn.  No upper or lower abdominal pain.  She is taking Omeprazole 40 mg daily, ever, her pulmonologist recently sent a prescription for Pantoprazole 40 mg once daily which she has not started yet as she is waiting for her insurance to authorize this medication.  She is taking Carafate twice daily for the past few weeks.    She went to the ED 05/22/2022 due to having worsening dysphagia symptoms with associated globus sensation.  Labs showed a WBC count of 7.8.  Hemoglobin 12.4.  Platelet 358.  BMP was normal.  She received a GI cocktail and her symptoms improved so she left the ED without being formally discharged.  She has a history of dysphagia, GERD and H. Pylori x 2 most (initially diagnosed per blood tests 10 yrs ago and + H. Pylori breath test 02/3029 treated with amoxicillin/clarithromycin and PPI x 2 weeks. S/P EGD 08/06/2019 by Dr. Orvan Falconer which identified mild reflux and mild chronic gastritis without evidence of H. pylori.  She is passing normal formed bowel movement daily.  She has occasional constipation.  No rectal bleeding  or black stools.  She underwent a colonoscopy 08/06/2019 which showed internal hemorrhoids otherwise was normal.  She was advised to repeat a colonoscopy in 5 years, father with history of colon polyps and paternal aunt with history of colon cancer diagnosed in her 70s.  She has a history of paroxysmal atrial fibrillation associated with elevated stress levels and occurs once or twice yearly.  She takes flecainide and Cardizem as needed as recommended by her cardiologist Dr. Rollene Rotunda.  She has felt some brief heart irregularity a few weeks ago for which she took Diltiazem and 1/4 tab of Flecainide with symptom relief without recurrence since then.  Negative coronary CTA 09/2021.  LVEF 60 to 65%.  No chest pain, dizziness or shortness of breath.      Latest Ref Rng & Units 05/22/2022    2:08 AM 09/01/2021    1:08 PM 04/11/2021    8:54 AM  CBC  WBC 4.0 - 10.5 K/uL 7.8  7.2  5.7   Hemoglobin 12.0 - 15.0 g/dL 95.6  21.3  08.6   Hematocrit 36.0 - 46.0 % 38.6  37.2  36.4   Platelets 150 - 400 K/uL 358  381  382        Latest Ref Rng & Units 05/22/2022    2:08 AM 09/01/2021    1:08 PM 05/25/2021   10:06 AM  CMP  Glucose 70 - 99 mg/dL 99  87    BUN 6 - 20 mg/dL 15  12    Creatinine 0.44 - 1.00 mg/dL 7.59  1.63    Sodium 846 - 145 mmol/L 138  137    Potassium 3.5 - 5.1 mmol/L 4.1  4.2    Chloride 98 - 111 mmol/L 110  104    CO2 22 - 32 mmol/L 21  24    Calcium 8.9 - 10.3 mg/dL 8.9  9.3    Total Protein 6.0 - 8.5 g/dL   7.3   Total Bilirubin 0.0 - 1.2 mg/dL   0.2   Alkaline Phos 44 - 121 IU/L   55   AST 0 - 40 IU/L   15   ALT 0 - 32 IU/L   19     ECHO 04/07/2020: 1. Left ventricular ejection fraction, by estimation, is 60 to 65%. The left ventricle has normal function. The left ventricle has no regional wall motion abnormalities. Left ventricular diastolic parameters were normal. 2. Right ventricular systolic function is normal. The right ventricular size is normal. There is normal  pulmonary artery systolic pressure. 3. The mitral valve is abnormal. Trivial mitral valve regurgitation. 4. The aortic valve is tricuspid. Aortic valve regurgitation is not visualized. 5. The inferior vena cava is normal in size with greater than 50% respiratory variability, suggesting right atrial pressure of 3 mmHg.  EGD 08/06/2019: - Slightly irregular z-line. Otherwise, normal esophagus. Biopsied. - Normal stomach. Biopsied. - Normal examined duodenum.  1. Surgical [P], duodenum - BENIGN SMALL BOWEL MUCOSA. - NO VILLOUS BLUNTING OR INCREASE IN INTRAEPITHELIAL LYMPHOCYTES. - NO DYSPLASIA OR MALIGNANCY. 2. Surgical [P], gastric antrum - CHRONIC GASTRITIS WITH FOCAL INTESTINAL METAPLASIA. - WARTHIN-STARRY IS NEGATIVE FOR HELICOBACTER PYLORI. - NO DYSPLASIA, OR MALIGNANCY. 3. Surgical [P], stomach, fundus - MILD CHRONIC GASTRITIS. - WARTHIN-STARRY IS NEGATIVE FOR HELICOBACTER PYLORI. - NO INTESTINAL METAPLASIA, DYSPLASIA, OR MALIGNANCY. 4. Surgical [P], distal esophagus - MILD REFLUX CHANGES. - NO INTESTINAL METAPLASIA, DYSPLASIA, OR MALIGNANCY IDENTIFIED. 5. Surgical [P], mid and proximal esophagus - BENIGN SQUAMOUS MUCOSA. - NO INCREASE IN INTRAEPITHELIAL EOSINOPHILS. - NO INTESTINAL METAPLASIA, DYSPLASIA, OR MALIGNANCY. Valinda Hoar MD  Colonoscopy 08/06/2019: - Non-bleeding internal hemorrhoids. - The examination was otherwise normal on direct and retroflexion views. - No specimens collected. - 5 year recall colonoscopy   Past Medical History:  Diagnosis Date   Anxiety    Breast lump    Mammogram (last 2013)   DDD (degenerative disc disease), lumbar    GERD (gastroesophageal reflux disease)    Herpes simplex labialis    PAF (paroxysmal atrial fibrillation) (HCC)    Past Surgical History:  Procedure Laterality Date   CESAREAN SECTION     TUBAL LIGATION     2008   Current Outpatient Medications on File Prior to Visit  Medication Sig Dispense Refill    cyclobenzaprine (FLEXERIL) 10 MG tablet Take 1 tablet (10 mg total) by mouth 3 (three) times daily as needed for muscle spasms. 90 tablet 1   diltiazem (CARDIZEM) 30 MG tablet Take 1 tablet every 4 hours AS NEEDED for heart rate >100 30 tablet 3   flecainide (TAMBOCOR) 150 MG tablet Take 1-2 tablets by mouth once for afib can only use every 4 days 6 tablet 1   pantoprazole (PROTONIX) 40 MG tablet Take 1 tablet (40 mg total) by mouth daily. 30 tablet 5   sucralfate (CARAFATE) 1 GM/10ML suspension Take 10 mLs (1 g total) by mouth 4 (four) times daily -  with meals and at bedtime. 420 mL 3   valACYclovir (  VALTREX) 1000 MG tablet Take 1 tablet (1,000 mg total) by mouth 2 (two) times daily as needed (for cold sores). 30 tablet 1   gabapentin (NEURONTIN) 300 MG capsule Take 1 capsule (300 mg total) by mouth at bedtime. 30 capsule 0   No current facility-administered medications on file prior to visit.   No Known Allergies  Current Medications, Allergies, Past Medical History, Past Surgical History, Family History and Social History were reviewed in [employer] record.  Review of Systems:   Constitutional: Negative for fever, sweats, chills or weight loss.  Respiratory: Negative for shortness of breath.   Cardiovascular: Negative for chest pain, palpitations and leg swelling.  Gastrointestinal: See HPI.  Musculoskeletal: Negative for back pain or muscle aches.  Neurological: Negative for dizziness, headaches or paresthesias.   Physical Exam: BP 122/70   Pulse 70   Ht 5\' 7"  (1.702 m)   Wt 210 lb (95.3 kg)   SpO2 98%   BMI 32.89 kg/m   General: 46 year old female in no acute distress. Head: Normocephalic and atraumatic. Eyes: No scleral icterus. Conjunctiva pink . Ears: Normal auditory acuity. Mouth: Dentition intact. No ulcers or lesions.  Lungs: Clear throughout to auscultation. Heart: Regular rate and rhythm, no murmur. Abdomen: Soft, nontender and nondistended.  No masses or hepatomegaly. Normal bowel sounds x 4 quadrants.  Rectal: Deferred. Musculoskeletal: Symmetrical with no gross deformities. Extremities: No edema. Neurological: Alert oriented x 4. No focal deficits.  Psychological: Alert and cooperative. Normal mood and affect  Assessment and Recommendations:  39) 46 year old female with a history of GERD, H. Pylori x 2 treated who presents with dysphagia for the past year which has progressively worsened over the past 3 months.  EGD 08/06/2019 showed evidence of mild reflux, chronic gastritis without evidence of H. pylori. -Barium swallow to rule out esophageal dysmotility -EGD with possible esophageal dilatation benefits and risks discussed including risk with sedation, risk of bleeding, perforation and infection  -Agree with switching Omeprazole to Pantoprazole 40 mg once daily, patient will contact our office if Pantoprazole authorization declined (initial prescription prescribed by her pulmonologist) -Continue Carafate twice daily not to take within 2 hours of any other medication -Further recommendations to be determined after EGD and barium swallow study completed  2) History of paroxysmal atrial fibrillation trolled on as needed flecainide and diltiazem.  Not on ASA or anticoagulation. Afib CHA2DS2 - VASc score of 1. -Patient to contact her office if she experiences any runs of atrial fibrillation prior to her EGD date  3) Colon cancer screening.  Colonoscopy 07/2019 showed internal hemorrhoids, no polyps.  Father with history of colon polyps and paternal aunt with history of colon cancer.  4) Constipation  -MiraLAX nightly as needed

## 2022-07-13 NOTE — Progress Notes (Signed)
Reviewed and agree with management plans. ? ?Thelton Graca L. Sapphire Tygart, MD, MPH  ?

## 2022-07-24 ENCOUNTER — Ambulatory Visit (HOSPITAL_COMMUNITY)
Admission: RE | Admit: 2022-07-24 | Discharge: 2022-07-24 | Disposition: A | Discharge status: Home / Self Care | Payer: Medicaid Other | Source: Ambulatory Visit | Attending: Nurse Practitioner | Admitting: Nurse Practitioner

## 2022-07-24 DIAGNOSIS — R1013 Epigastric pain: Secondary | ICD-10-CM | POA: Diagnosis present

## 2022-07-24 DIAGNOSIS — K2100 Gastro-esophageal reflux disease with esophagitis, without bleeding: Secondary | ICD-10-CM | POA: Diagnosis present

## 2022-08-02 ENCOUNTER — Encounter: Payer: Medicaid Other | Admitting: Gastroenterology

## 2022-08-03 ENCOUNTER — Ambulatory Visit: Payer: Medicaid Other | Admitting: Neurology

## 2022-08-17 ENCOUNTER — Encounter: Payer: Self-pay | Admitting: Family

## 2022-08-17 NOTE — Telephone Encounter (Signed)
Pt also needs a titer for varicella or proof that she has chicken pox as a child.  CB@ [phone]

## 2022-08-18 ENCOUNTER — Telehealth: Payer: Self-pay | Admitting: *Deleted

## 2022-08-18 NOTE — Telephone Encounter (Signed)
Pt contacted to advise that fit to duty forms can not be completed by her PCP,because we do not do those type of physicals here. Pt given information for [facility] and wellness to see if their office could assist.  -advised pt lab visit can be scheduled for varicella titer

## 2022-08-18 NOTE — Telephone Encounter (Signed)
Error

## 2022-08-21 ENCOUNTER — Ambulatory Visit: Payer: Medicaid Other | Admitting: Neurology

## 2022-08-21 ENCOUNTER — Encounter: Payer: Self-pay | Admitting: Neurology

## 2022-08-21 VITALS — BP 104/70 | HR 69 | Ht 67.0 in | Wt 203.0 lb

## 2022-08-21 DIAGNOSIS — R202 Paresthesia of skin: Secondary | ICD-10-CM | POA: Diagnosis not present

## 2022-08-21 MED ORDER — GABAPENTIN 100 MG PO CAPS: 300.0000 mg | ORAL_CAPSULE | Freq: Every evening | ORAL | 6 refills | Status: DC | PRN

## 2022-08-21 NOTE — Progress Notes (Signed)
Chief Complaint  Patient presents with   New Patient (Initial Visit)    Room 12, NP internal referral for neuropathic pain in feet, bilateral CTS/        ASSESSMENT AND PLAN  Melanie Vincent is a 46 y.o. female   Bilateral hands paresthesia  Bilateral wrist Tinel signs, right worse than left, decreased to pinprick at the left wrist 3 finger pads, most suggestive of carpal tunnel syndromes,  EMG nerve conduction study  Between evaluations including TSH   DIAGNOSTIC DATA (LABS, IMAGING, TESTING) - I reviewed patient records, labs, notes, testing and imaging myself where available.   MEDICAL HISTORY:  Melanie Vincent is a 46 year old female, seen in request by her [facility] care nurse practitioner Zonia Kief, Amy for evaluation of bilateral hands paresthesia, initial evaluation was August 21, 2022    I reviewed and summarized the referring note. PMHX. Paroxysmal A fib   She work as a Radiographer, therapeutic, since 2022, she noticed intermittent bilateral hands paresthesia, most noticeable at the right hand 2nd-4th fingers, hand surgeon suggested her wear wrist splint, it does help her symptoms, which are intermittent, on average she has to wake up at least once every night to shake her hands,  She has neck strain, denies significant radiating pain, denies weakness, no gait abnormalities  PHYSICAL EXAM:   Vitals:   08/21/22 0936  BP: 104/70  Pulse: 69  Weight: 203 lb (92.1 kg)  Height: 5\' 7"  (1.702 m)   Not recorded     Body mass index is 31.79 kg/m.  PHYSICAL EXAMNIATION:  Gen: NAD, conversant, well nourised, well groomed                     Cardiovascular: Regular rate rhythm, no peripheral edema, warm, nontender. Eyes: Conjunctivae clear without exudates or hemorrhage Neck: Supple, no carotid bruits. Pulmonary: Clear to auscultation bilaterally   NEUROLOGICAL EXAM:  MENTAL STATUS: Speech/cognition: Awake, alert, oriented to history taking and casual  conversation CRANIAL NERVES: CN II: Visual fields are full to confrontation. Pupils are round equal and briskly reactive to light. CN III, IV, VI: extraocular movement are normal. No ptosis. CN V: Facial sensation is intact to light touch CN VII: Face is symmetric with normal eye closure  CN VIII: Hearing is normal to causal conversation. CN IX, X: Phonation is normal. CN XI: Head turning and shoulder shrug are intact  MOTOR: There is no pronator drift of out-stretched arms. Muscle bulk and tone are normal. Muscle strength is normal.  REFLEXES: Reflexes are 2+ and symmetric at the biceps, triceps, knees, and ankles. Plantar responses are flexor.  SENSORY: Intact to light touch, pinprick and vibratory sensation are intact in fingers and toes with exception of decreased pinprick at the first 3 finger pads, bilateral wrist Tinel signs.  COORDINATION: There is no trunk or limb dysmetria noted.  GAIT/STANCE: Posture is normal. Gait is steady with normal steps,    REVIEW OF SYSTEMS:  Full 14 system review of systems performed and notable only for as above All other review of systems were negative.   ALLERGIES: No Known Allergies  HOME MEDICATIONS: Current Outpatient Medications  Medication Sig Dispense Refill   cyclobenzaprine (FLEXERIL) 10 MG tablet Take 1 tablet (10 mg total) by mouth 3 (three) times daily as needed for muscle spasms. 90 tablet 1   diltiazem (CARDIZEM) 30 MG tablet Take 1 tablet every 4 hours AS NEEDED for heart rate >100 30 tablet 3   flecainide (TAMBOCOR)  150 MG tablet Take 1-2 tablets by mouth once for afib can only use every 4 days 6 tablet 1   pantoprazole (PROTONIX) 40 MG tablet Take 1 tablet (40 mg total) by mouth daily. 30 tablet 5   sucralfate (CARAFATE) 1 GM/10ML suspension Take 10 mLs (1 g total) by mouth 4 (four) times daily -  with meals and at bedtime. 420 mL 3   valACYclovir (VALTREX) 1000 MG tablet Take 1 tablet (1,000 mg total) by mouth 2 (two)  times daily as needed (for cold sores). 30 tablet 1   gabapentin (NEURONTIN) 300 MG capsule Take 1 capsule (300 mg total) by mouth at bedtime. 30 capsule 0   No current facility-administered medications for this visit.    PAST MEDICAL HISTORY: Past Medical History:  Diagnosis Date   Anxiety    Breast lump    Mammogram (last 2013)   DDD (degenerative disc disease), lumbar    GERD (gastroesophageal reflux disease)    Herpes simplex labialis    PAF (paroxysmal atrial fibrillation) (HCC)     PAST SURGICAL HISTORY: Past Surgical History:  Procedure Laterality Date   CESAREAN SECTION     TUBAL LIGATION     2008    FAMILY HISTORY: Family History  Problem Relation Age of Onset   Diabetes Father    Hypertension Father    Stroke Father    Colon polyps Father    Breast cancer Half-Sister 27   Diabetes Paternal Grandmother    Heart disease Paternal Grandmother    Diabetes Paternal Grandfather    Heart disease Paternal Grandfather    Colon cancer Paternal Aunt        late 70's   Breast cancer Maternal Aunt    Esophageal cancer Neg Hx    Rectal cancer Neg Hx    Stomach cancer Neg Hx     SOCIAL HISTORY: Social History   Socioeconomic History   Marital status: Single    Spouse name: Not on file   Number of children: 4   Years of education: Not on file   Highest education level: Not on file  Occupational History   Occupation: Caregiver  Tobacco Use   Smoking status: Former    Packs/day: 0.25    Years: 5.00    Total pack years: 1.25    Types: Cigarettes    Quit date: 06/15/2007    Years since quitting: 15.1    Passive exposure: Past   Smokeless tobacco: Never  Vaping Use   Vaping Use: Some days   Substances: CBD   Devices: smoked CBD vape occ  Substance and Sexual Activity   Alcohol use: Yes    Alcohol/week: 4.0 standard drinks of alcohol    Types: 2 Glasses of wine, 2 Standard drinks or equivalent per week    Comment: 3 drinks per week   Drug use: Yes     Types: Marijuana   Sexual activity: Yes    Birth control/protection: Surgical, Condom  Other Topics Concern   Not on file  Social History Narrative   Lives in [place] with 3 children and has two others.     Single.   Works as a Clinical biochemist (travel)   Social Determinants of Corporate investment banker Strain: Not on [employer] Insecurity: Not on file  Transportation Needs: Not on file  Physical Activity: Not on file  Stress: Not on file  Social Connections: Not on file  Intimate Partner Violence: Not on file  Levert Feinstein, M.D. Ph.D.  [facility] [address], [address] [place], [place] [address] Ph: [phone] Fax: [phone]  CC:  Rema Fendt, NP [address] [place],  [place] [address]  Rema Fendt, NP

## 2022-08-22 LAB — ANA W/REFLEX IF POSITIVE
Anti JO-1: <0.2 AI (ref 0.0–0.9)
Anti Nuclear Antibody (ANA): POSITIVE — AB
Centromere Ab Screen: <0.2 AI (ref 0.0–0.9)
Chromatin Ab SerPl-aCnc: <0.2 AI (ref 0.0–0.9)
ENA RNP Ab: 1.1 AI — ABNORMAL HIGH (ref 0.0–0.9)
ENA SM Ab Ser-aCnc: <0.2 AI (ref 0.0–0.9)
ENA SSA (RO) Ab: <0.2 AI (ref 0.0–0.9)
ENA SSB (LA) Ab: <0.2 AI (ref 0.0–0.9)
Scleroderma (Scl-70) (ENA) Antibody, IgG: 0.2 AI (ref 0.0–0.9)
dsDNA Ab: <1 IU/mL (ref 0–9)

## 2022-08-22 LAB — VITAMIN B12: Vitamin B-12: 1373 pg/mL — ABNORMAL HIGH (ref 232–1245)

## 2022-08-22 LAB — VITAMIN D 25 HYDROXY (VIT D DEFICIENCY, FRACTURES): Vit D, 25-Hydroxy: 21.5 ng/mL — ABNORMAL LOW (ref 30.0–100.0)

## 2022-08-22 LAB — TSH: TSH: 0.613 u[IU]/mL (ref 0.450–4.500)

## 2022-08-22 LAB — C-REACTIVE PROTEIN: CRP: 3 mg/L (ref 0–10)

## 2022-08-22 LAB — SEDIMENTATION RATE: Sed Rate: 4 mm/hr (ref 0–32)

## 2022-08-23 ENCOUNTER — Encounter: Payer: Self-pay | Admitting: Neurology

## 2022-08-23 NOTE — Telephone Encounter (Signed)
I called [facility] explained the laboratory evaluation, low titer ANA in the setting of normal ESR C-reactive protein, has unknown clinical significance.

## 2022-08-30 ENCOUNTER — Ambulatory Visit (HOSPITAL_COMMUNITY): Payer: Medicaid Other | Admitting: Nurse Practitioner

## 2022-09-05 ENCOUNTER — Ambulatory Visit (HOSPITAL_COMMUNITY): Payer: Medicaid Other | Admitting: Nurse Practitioner

## 2022-11-17 ENCOUNTER — Encounter: Payer: Medicaid Other | Admitting: Neurology

## 2022-11-30 ENCOUNTER — Encounter: Payer: Medicaid Other | Admitting: Obstetrics and Gynecology

## 2022-12-01 ENCOUNTER — Encounter: Payer: Medicaid Other | Admitting: Obstetrics and Gynecology

## 2022-12-20 ENCOUNTER — Telehealth: Payer: Self-pay | Admitting: Neurology

## 2022-12-20 NOTE — Telephone Encounter (Signed)
LVM and sent mychart msg informing pt of appointment change due to MD being out 

## 2023-01-03 ENCOUNTER — Encounter (HOSPITAL_COMMUNITY): Payer: Self-pay

## 2023-01-03 ENCOUNTER — Other Ambulatory Visit: Payer: Self-pay

## 2023-01-03 ENCOUNTER — Telehealth (HOSPITAL_COMMUNITY): Payer: Self-pay

## 2023-01-03 ENCOUNTER — Emergency Department (HOSPITAL_COMMUNITY): Payer: Medicaid Other

## 2023-01-03 ENCOUNTER — Emergency Department (HOSPITAL_COMMUNITY)
Admission: EM | Admit: 2023-01-03 | Discharge: 2023-01-03 | Disposition: A | Discharge status: Home / Self Care | Payer: Medicaid Other | Attending: Emergency Medicine | Admitting: Emergency Medicine

## 2023-01-03 DIAGNOSIS — A084 Viral intestinal infection, unspecified: Secondary | ICD-10-CM

## 2023-01-03 DIAGNOSIS — R109 Unspecified abdominal pain: Secondary | ICD-10-CM | POA: Diagnosis present

## 2023-01-03 DIAGNOSIS — I4891 Unspecified atrial fibrillation: Secondary | ICD-10-CM

## 2023-01-03 DIAGNOSIS — M25512 Pain in left shoulder: Secondary | ICD-10-CM | POA: Insufficient documentation

## 2023-01-03 LAB — COMPREHENSIVE METABOLIC PANEL
ALT: 21 U/L (ref 0–44)
AST: 25 U/L (ref 15–41)
Albumin: 4.1 g/dL (ref 3.5–5.0)
Alkaline Phosphatase: 47 U/L (ref 38–126)
Anion gap: 11 (ref 5–15)
BUN: 14 mg/dL (ref 6–20)
CO2: 19 mmol/L — ABNORMAL LOW (ref 22–32)
Calcium: 9 mg/dL (ref 8.9–10.3)
Chloride: 107 mmol/L (ref 98–111)
Creatinine, Ser: 0.7 mg/dL (ref 0.44–1.00)
GFR, Estimated: >60 mL/min (ref >60)
Glucose, Bld: 115 mg/dL — ABNORMAL HIGH (ref 70–99)
Potassium: 3.9 mmol/L (ref 3.5–5.1)
Sodium: 137 mmol/L (ref 135–145)
Total Bilirubin: 0.4 mg/dL (ref 0.3–1.2)
Total Protein: 7.1 g/dL (ref 6.5–8.1)

## 2023-01-03 LAB — URINALYSIS, ROUTINE W REFLEX MICROSCOPIC
Bilirubin Urine: NEGATIVE
Glucose, UA: NEGATIVE mg/dL
Hgb urine dipstick: NEGATIVE
Ketones, ur: NEGATIVE mg/dL
Leukocytes,Ua: NEGATIVE
Nitrite: NEGATIVE
Protein, ur: NEGATIVE mg/dL
Specific Gravity, Urine: 1.014 (ref 1.005–1.030)
pH: 8 (ref 5.0–8.0)

## 2023-01-03 LAB — TROPONIN I (HIGH SENSITIVITY)
Troponin I (High Sensitivity): 5 ng/L (ref <18)
Troponin I (High Sensitivity): 5 ng/L (ref <18)

## 2023-01-03 LAB — CBC
HCT: 41 % (ref 36.0–46.0)
Hemoglobin: 13.7 g/dL (ref 12.0–15.0)
MCH: 29.7 pg (ref 26.0–34.0)
MCHC: 33.4 g/dL (ref 30.0–36.0)
MCV: 88.7 fL (ref 80.0–100.0)
Platelets: 360 10*3/uL (ref 150–400)
RBC: 4.62 MIL/uL (ref 3.87–5.11)
RDW: 13.9 % (ref 11.5–15.5)
WBC: 11.2 10*3/uL — ABNORMAL HIGH (ref 4.0–10.5)
nRBC: 0 % (ref 0.0–0.2)

## 2023-01-03 LAB — LIPASE, BLOOD: Lipase: 31 U/L (ref 11–51)

## 2023-01-03 LAB — MAGNESIUM: Magnesium: 2 mg/dL (ref 1.7–2.4)

## 2023-01-03 MED ORDER — METOCLOPRAMIDE HCL 5 MG/ML IJ SOLN
5.0000 mg | Freq: Once | INTRAMUSCULAR | Status: AC
  Administered 2023-01-03: 5 mg via INTRAVENOUS
  Filled 2023-01-03: qty 2

## 2023-01-03 MED ORDER — SODIUM CHLORIDE 0.9 % IV BOLUS
500.0000 mL | Freq: Once | INTRAVENOUS | Status: AC
  Administered 2023-01-03: 500 mL via INTRAVENOUS

## 2023-01-03 MED ORDER — DILTIAZEM HCL 25 MG/5ML IV SOLN
5.0000 mg | Freq: Once | INTRAVENOUS | Status: AC
  Administered 2023-01-03: 5 mg via INTRAVENOUS
  Filled 2023-01-03: qty 5

## 2023-01-03 MED ORDER — ONDANSETRON HCL 4 MG/2ML IJ SOLN
4.0000 mg | Freq: Once | INTRAMUSCULAR | Status: AC
  Administered 2023-01-03: 4 mg via INTRAVENOUS
  Filled 2023-01-03: qty 2

## 2023-01-03 MED ORDER — ONDANSETRON HCL 4 MG PO TABS: 4.0000 mg | ORAL_TABLET | Freq: Four times a day (QID) | ORAL | 0 refills | Status: DC

## 2023-01-03 MED ORDER — DILTIAZEM HCL 30 MG PO TABS: ORAL_TABLET | ORAL | 3 refills | Status: AC

## 2023-01-03 MED ORDER — FLECAINIDE ACETATE 150 MG PO TABS: ORAL_TABLET | ORAL | 1 refills | Status: AC

## 2023-01-03 MED ORDER — DILTIAZEM HCL 25 MG/5ML IV SOLN
10.0000 mg | Freq: Once | INTRAVENOUS | Status: AC
  Administered 2023-01-03: 10 mg via INTRAVENOUS
  Filled 2023-01-03: qty 5

## 2023-01-03 MED ORDER — ONDANSETRON 4 MG PO TBDP
4.0000 mg | ORAL_TABLET | Freq: Once | ORAL | Status: DC | PRN
  Filled 2023-01-03: qty 1

## 2023-01-03 NOTE — ED Notes (Signed)
MD Palumbo made aware about patient HR

## 2023-01-03 NOTE — ED Notes (Signed)
Attempted to give pt Zofran after verifying patient was not allergic to anything, when pt stated Zofran doesn't work for me and I think I am allergic because it makes me itch and more nauseous. PRN order of Zofran removed and added to patients allergy list.

## 2023-01-03 NOTE — ED Notes (Signed)
Got patient on the monitor and got patient some warm blankets patient is resting with call bell in reach

## 2023-01-03 NOTE — ED Triage Notes (Signed)
Pt arrived POV from home c/o generalized abdominal pain and N/V/D since yesterday. Pt also states she thinks she is in a-fib because of this and was starting to have left shoulder pain.

## 2023-01-03 NOTE — Consult Note (Addendum)
Cardiology Consultation   Patient ID: TERRYANN VERBEEK MRN: [id number]; DOB: 1976-05-25  Admit date: 01/03/2023 Date of Consult: 01/03/2023  PCP:  Camillia Herter, NP   [place] [facility] Cardiologist:  Minus Breeding, MD  Electrophysiologist:  Thompson Grayer, MD       Patient Profile:   LANAYAH GARTLEY is a 47 y.o. female with a hx of PAF, GERD and normal cors on coronary CT 09/2021 who is being seen 01/03/2023 for the evaluation of PAF and left arm pain at the request of Dr. Francia Greaves.  History of Present Illness:   Ms. Lore is a pleasant 47 year old female with past medical history of paroxysmal atrial fibrillation, GERD and history of normal coronary artery coronary CTA in October 2022.  She was initially diagnosed with atrial fibrillation in 2015 and was placed on as needed dose of diltiazem and pill in the pocket flecainide.  She has avoided triggers such as alcohol.  Coronary CTA obtained on 09/26/2021 showed coronary calcium score of 0, no coronary artery disease.  Patient was last seen by Dr. Percival Spanish in December 2022 at which time he was doing well.   Patient was in her usual state of health until yesterday 01/02/2023.  She woke up feeling fine, however after sending her kid to school, she felt crummy.  Around 11 AM, she started having left arm pain radiating to the neck.  Symptom did not go away with Tylenol and lasted roughly 8 hours before self resolved.  She still have some neck pain but no arm pain at this point.  Last night, she started having nausea, vomiting and epigastric tenderness.  She denies any pain.  Around 2 AM, she got up to vomit for the third time and subsequently felt she went into atrial fibrillation.  She has good cardiac awareness of A-fib which typically presented itself as palpitation, fatigue and shortness of breath.  She sought medical attention at [facility], ED.  EKG showed atrial fibrillation with RVR.  She was given 5 mg followed by 10 mg of  IV diltiazem, heart rate improved to high 90s to low 100 range.  Serial troponin negative x 2.  Urinalysis negative.  Chest x-ray showed lower lung volume was mild atelectasis, no acute abnormality.  Cardiology service consulted for left arm pain and A-fib.   Past Medical History:  Diagnosis Date   Anxiety    Breast lump    Mammogram (last 2013)   DDD (degenerative disc disease), lumbar    GERD (gastroesophageal reflux disease)    Herpes simplex labialis    PAF (paroxysmal atrial fibrillation) ([place])     Past Surgical History:  Procedure Laterality Date   CESAREAN SECTION     TUBAL LIGATION     2008     Home Medications:  Prior to Admission medications   Medication Sig Start Date End Date Taking? Authorizing Provider  cyclobenzaprine (FLEXERIL) 10 MG tablet Take 1 tablet (10 mg total) by mouth 3 (three) times daily as needed for muscle spasms. 05/25/21   Camillia Herter, NP  diltiazem (CARDIZEM) 30 MG tablet Take 1 tablet every 4 hours AS NEEDED for heart rate >100 12/01/21   Sherran Needs, NP  flecainide (TAMBOCOR) 150 MG tablet Take 1-2 tablets by mouth once for afib can only use every 4 days 12/01/21   Sherran Needs, NP  gabapentin (NEURONTIN) 100 MG capsule Take 3 capsules (300 mg total) by mouth at bedtime as needed. 08/21/22  Levert Feinstein, MD  gabapentin (NEURONTIN) 300 MG capsule Take 1 capsule (300 mg total) by mouth at bedtime. 05/17/22 06/16/22  Rema Fendt, NP  pantoprazole (PROTONIX) 40 MG tablet Take 1 tablet (40 mg total) by mouth daily. 06/16/22   Omar Person, MD  sucralfate (CARAFATE) 1 GM/10ML suspension Take 10 mLs (1 g total) by mouth 4 (four) times daily -  with meals and at bedtime. 05/24/22   Rema Fendt, NP  valACYclovir (VALTREX) 1000 MG tablet Take 1 tablet (1,000 mg total) by mouth 2 (two) times daily as needed (for cold sores). 05/25/21   Rema Fendt, NP    Inpatient Medications: Scheduled Meds:   Continuous Infusions:  PRN  Meds:   Allergies:    Allergies  Allergen Reactions   Zofran [Ondansetron Hcl] Itching    Social History:   Social History   Socioeconomic History   Marital status: Single    Spouse name: Not on file   Number of children: 4   Years of education: Not on file   Highest education level: Not on file  Occupational History   Occupation: Caregiver  Tobacco Use   Smoking status: Former    Packs/day: 0.25    Years: 5.00    Total pack years: 1.25    Types: Cigarettes    Quit date: 06/15/2007    Years since quitting: 15.5    Passive exposure: Past   Smokeless tobacco: Never  Vaping Use   Vaping Use: Some days   Substances: CBD   Devices: smoked CBD vape occ  Substance and Sexual Activity   Alcohol use: Yes    Alcohol/week: 4.0 standard drinks of alcohol    Types: 2 Glasses of wine, 2 Standard drinks or equivalent per week    Comment: 3 drinks per week   Drug use: Yes    Types: Marijuana   Sexual activity: Yes    Birth control/protection: Surgical, Condom  Other Topics Concern   Not on file  Social History Narrative   Lives in [place] with 3 children and has two others.     Single.   Works as a Clinical biochemist (travel)   Social Determinants of Community education officer: Not on Ship broker Insecurity: Not on file  Transportation Needs: Not on file  Physical Activity: Not on file  Stress: Not on file  Social Connections: Not on file  Intimate Partner Violence: Not on file    Family History:    Family History  Problem Relation Age of Onset   Diabetes Father    Hypertension Father    Stroke Father    Colon polyps Father    Breast cancer Half-Sister 72   Diabetes Paternal Grandmother    Heart disease Paternal Grandmother    Diabetes Paternal Grandfather    Heart disease Paternal Grandfather    Colon cancer Paternal Aunt        late 70's   Breast cancer Maternal Aunt    Esophageal cancer Neg Hx    Rectal cancer Neg Hx    Stomach cancer Neg Hx       ROS:  Please see the history of present illness.   All other ROS reviewed and negative.     Physical Exam/Data:   Vitals:   01/03/23 0745 01/03/23 0800 01/03/23 0939 01/03/23 1000  BP:  (!) 126/108 (!) 120/96 (!) 122/93  Pulse: (!) 120 (!) 111 (!) 109 100  Resp: (!) 21 (!) 23 19 (!)  26  Temp:   98 F (36.7 C)   TempSrc:      SpO2: 99% 99% 100% 100%  Weight:      Height:       No intake or output data in the 24 hours ending 01/03/23 1107    01/03/2023    3:53 AM 08/21/2022    9:36 AM 07/07/2022    8:56 AM  Last 3 Weights  Weight (lbs) 200 lb 203 lb 210 lb  Weight (kg) 90.719 kg 92.08 kg 95.255 kg     Body mass index is 31.32 kg/m.  General:  Well nourished, well developed, in no acute distress HEENT: normal Neck: no JVD Vascular: No carotid bruits; Distal pulses 2+ bilaterally Cardiac:  normal S1, S2; RRR; no murmur  Lungs:  clear to auscultation bilaterally, no wheezing, rhonchi or rales  Abd: soft, nontender, no hepatomegaly  Ext: no edema Musculoskeletal:  No deformities, BUE and BLE strength normal and equal Skin: warm and dry  Neuro:  CNs 2-12 intact, no focal abnormalities noted Psych:  Normal affect   EKG:  The EKG was personally reviewed and demonstrates:  atrial fibrillation with RVR Telemetry:  Telemetry was personally reviewed and demonstrates: Atrial fibrillation, heart rate improved to high 90s to low 100 range.  Relevant CV Studies:  Coronary CT 09/26/2021 Coronary Arteries:  Normal coronary origin.  Right dominance.   Left main: The left main is a large caliber vessel with a normal take off from the left coronary cusp that bifurcates to form a left anterior descending artery and a left circumflex artery. There is no plaque or stenosis.   Left anterior descending artery: The LAD is patent without evidence of plaque or stenosis. The LAD gives off 2 patent diagonal branches.   Left circumflex artery: The LCX is non-dominant and patent with  no evidence of plaque or stenosis. The LCX gives off 1 patent obtuse marginal branch.   Right coronary artery: The RCA is dominant with normal take off from the right coronary cusp. There is no evidence of plaque or stenosis. The RCA terminates as a PDA and right posterolateral branch without evidence of plaque or stenosis.   Right Atrium: Right atrial size is within normal limits.   Right Ventricle: The right ventricular cavity is within normal limits.   Left Atrium: Left atrial size is normal in size with no left atrial appendage filling defect.   Left Ventricle: The ventricular cavity size is within normal limits. There are no stigmata of prior infarction. There is no abnormal filling defect.   Pulmonary arteries: Normal in size without proximal filling defect.   Pulmonary veins: Normal pulmonary venous drainage.   Pericardium: Normal thickness with no significant effusion or calcium present.   Cardiac valves: The aortic valve is trileaflet without significant calcification. The mitral valve is normal structure without significant calcification.   Aorta: Normal caliber with no significant disease.   Small PFO   Extra-cardiac findings: See attached radiology report for non-cardiac structures.   IMPRESSION: 1. Coronary calcium score of 0. This was 0 percentile for age-, sex, and race-matched controls.   2. Normal coronary origin with right dominance.   3. Normal coronary arteries.  Laboratory Data:  High Sensitivity Troponin:   Recent Labs  Lab 01/03/23 0632 01/03/23 0942  TROPONINIHS 5 5     Chemistry Recent Labs  Lab 01/03/23 0409 01/03/23 0632  NA 137  --   K 3.9  --   CL 107  --  CO2 19*  --   GLUCOSE 115*  --   BUN 14  --   CREATININE 0.70  --   CALCIUM 9.0  --   MG  --  2.0  GFRNONAA >60  --   ANIONGAP 11  --     Recent Labs  Lab 01/03/23 0409  PROT 7.1  ALBUMIN 4.1  AST 25  ALT 21  ALKPHOS 47  BILITOT 0.4   Lipids No results  for input(s): "CHOL", "TRIG", "HDL", "LABVLDL", "LDLCALC", "CHOLHDL" in the last 168 hours.  Hematology Recent Labs  Lab 01/03/23 0409  WBC 11.2*  RBC 4.62  HGB 13.7  HCT 41.0  MCV 88.7  MCH 29.7  MCHC 33.4  RDW 13.9  PLT 360   Thyroid No results for input(s): "TSH", "FREET4" in the last 168 hours.  BNPNo results for input(s): "BNP", "PROBNP" in the last 168 hours.  DDimer No results for input(s): "DDIMER" in the last 168 hours.   Radiology/Studies:  DG Chest Port 1 View  Result Date: 01/03/2023 CLINICAL DATA:  47 year old female with abdominal pain, nausea vomiting diarrhea. Atrial fibrillation. EXAM: PORTABLE CHEST 1 VIEW COMPARISON:  Cardiac CT 09/26/2021 and earlier. FINDINGS: Portable AP semi upright view at 0732 hours. Lung volumes match that mildly lower lung volumes. Mild crowding of markings at the lung bases. Cardiac size and mediastinal contours remain normal. Visualized tracheal air column is within normal limits. No pneumothorax, pulmonary edema, pleural effusion. No acute osseous abnormality identified. Negative visible bowel gas. IMPRESSION: Lower lung volumes with mild atelectasis. No other cardiopulmonary abnormality. Electronically Signed   By: Genevie Ann M.D.   On: 01/03/2023 07:52     Assessment and Plan:   Paroxysmal atrial fibrillation  - CHA2DS2-Vasc score 1 (female), on PRN diltiazem and pill in pocket flecainide at home. Not on anticoagulation given rarity and low CHA2DS2-Vasc score  -Patient has good cardiac awareness of A-fib, A-fib started this morning around 2 AM after the third vomiting episode.  She did not take any diltiazem or flecainide before coming to the ED.  Would recommend pill in the pocket flecainide to chemically convert the patient.  Left arm pain: Serial troponin negative x 2 despite prolonged left arm pain for 8 hours yesterday.  Coronary CT in October 2022 showed normal coronary arteries.  No further ischemic workup   Nausea and vomiting:  Also accompanied by epigastric tenderness.  Will defer to ED physician to consider GI viral panel (noravirus, COVID, flu B), no fever or chill   Risk Assessment/Risk Scores:          CHA2DS2-VASc Score = 1   This indicates a 0.6% annual risk of stroke. The patient's score is based upon: CHF History: 0 HTN History: 0 Diabetes History: 0 Stroke History: 0 Vascular Disease History: 0 Age Score: 0 Gender Score: 1          For questions or updates, please contact [place] Please consult [email or web address] for contact info under    Signed, Almyra Deforest, [place]  01/03/2023 11:07 AM  Patient seen and examined and agree with Almyra Deforest, PA as detailed above.  In brief, the patient is a 47 y.o. female with a hx of PAF, GERD and normal cors on coronary CT who presented to the ER with Afib and left arm pain for which Cardiology was consulted.  Patient with known history of pAfib with low overall burden. Has been maintained on as needed dilt and flec. Not on AC due to  low CHADs-vasc. Had been doing well until yesterday afternoon where she developed left arm pain radiating to her neck, nausea and vomiting. Pain lasted about 8 hours before resolving. Around 2am she then felt herself go into Afib prompting her to go to the ER.   In the ER, ECG with Afib with RVR. Labs with normal trop x2. She was given IV dilt with improvement in HR and she later converted to NSR.   GEN: No acute distress.   Neck: No JVD Cardiac: RRR, 1/6 systolic murmur Respiratory: Clear to auscultation bilaterally. GI: Soft, nontender, non-distended  MS: No edema; No deformity. Neuro:  Nonfocal  Psych: Normal affect    Plan: -Converted to NSR by my arrival -Continue prn dilt and flec (needs new scripts sent int) -Not on AC due to low CHADs-vasc -Coronary CTA without obstructive disease; trop negative x2. No further ischemic work-up needed at this time -Okay to discharge home from CV perspective  Gwyndolyn Kaufman, MD

## 2023-01-03 NOTE — ED Notes (Addendum)
Patient stepped out stated she will be her in 5 mins.

## 2023-01-03 NOTE — ED Provider Notes (Signed)
[facility] Provider Note   CSN: [id number] Arrival date & time: 01/03/23  0340     History  Chief Complaint  Patient presents with   Abdominal Pain   Atrial Fibrillation    Melanie Vincent is a 47 y.o. female. With past medical history of PAF, GERD who presents to the emergency department with abdominal pain.  States that yesterday she began having left shoulder pain around lunch time. She states that she tried to take tylenol but symptoms did not improve. She states around dinner she took ibuprofen which seemed to improve her shoulder pain. She states she then woke up at 1am with nausea, vomiting and epigastric abdominal pain. She states she has vomited about 5 times since early this morning and noticed a dull, central to left sided chest pain that radiated to her left shoulder. She felt her pulse and noticed she was in atrial fibrillation so she presented here to the ED. She states that she takes flecanide and cardizem as needed for her atrial fibrillation and is not anticoagulated due to its infrequency. She denies palpitations, shortness of breath, lightheadedness or dizziness, fever, cough, diarrhea, dysuria, vaginal discharge.    Abdominal Pain Associated symptoms: chest pain, nausea and vomiting   Associated symptoms: no cough, no fever and no shortness of breath   Atrial Fibrillation Associated symptoms include chest pain and abdominal pain. Pertinent negatives include no shortness of breath.       Home Medications Prior to Admission medications   Medication Sig Start Date End Date Taking? Authorizing Provider  ondansetron (ZOFRAN) 4 MG tablet Take 1 tablet (4 mg total) by mouth every 6 (six) hours. 01/03/23  Yes Mickie Hillier, PA-C  cyclobenzaprine (FLEXERIL) 10 MG tablet Take 1 tablet (10 mg total) by mouth 3 (three) times daily as needed for muscle spasms. 05/25/21   Camillia Herter, NP  diltiazem (CARDIZEM) 30 MG tablet  Take 1 tablet every 4 hours AS NEEDED for heart rate >100 01/03/23   Mickie Hillier, PA-C  flecainide (TAMBOCOR) 150 MG tablet Take 1-2 tablets by mouth once for afib can only use every 4 days 01/03/23   Mickie Hillier, PA-C  gabapentin (NEURONTIN) 100 MG capsule Take 3 capsules (300 mg total) by mouth at bedtime as needed. 08/21/22   Marcial Pacas, MD  gabapentin (NEURONTIN) 300 MG capsule Take 1 capsule (300 mg total) by mouth at bedtime. 05/17/22 06/16/22  Camillia Herter, NP  pantoprazole (PROTONIX) 40 MG tablet Take 1 tablet (40 mg total) by mouth daily. 06/16/22   Maryjane Hurter, MD  sucralfate (CARAFATE) 1 GM/10ML suspension Take 10 mLs (1 g total) by mouth 4 (four) times daily -  with meals and at bedtime. 05/24/22   Camillia Herter, NP  valACYclovir (VALTREX) 1000 MG tablet Take 1 tablet (1,000 mg total) by mouth 2 (two) times daily as needed (for cold sores). 05/25/21   Camillia Herter, NP      Allergies    Zofran Alvis Lemmings hcl]    Review of Systems   Review of Systems  Constitutional:  Negative for fever.  Respiratory:  Negative for cough and shortness of breath.   Cardiovascular:  Positive for chest pain. Negative for palpitations.  Gastrointestinal:  Positive for abdominal pain, nausea and vomiting.  All other systems reviewed and are negative.   Physical Exam Updated Vital Signs BP (!) 122/93   Pulse 67   Temp 98 F (36.7  C)   Resp 20   Ht 5\' 7"  (1.702 m)   Wt 90.7 kg   SpO2 91%   BMI 31.32 kg/m  Physical Exam Vitals and nursing note reviewed.  Constitutional:      General: She is not in acute distress.    Appearance: Normal appearance. She is well-developed. She is not ill-appearing or toxic-appearing.  HENT:     Head: Normocephalic.  Eyes:     General: No scleral icterus.    Extraocular Movements: Extraocular movements intact.  Cardiovascular:     Rate and Rhythm: Tachycardia present. Rhythm irregularly irregular.     Pulses:          Radial pulses are 2+ on  the right side and 2+ on the left side.     Heart sounds: No murmur heard. Pulmonary:     Effort: Pulmonary effort is normal. No respiratory distress.     Breath sounds: Normal breath sounds. No wheezing, rhonchi or rales.  Abdominal:     General: Abdomen is protuberant. Bowel sounds are normal.     Palpations: Abdomen is soft.     Tenderness: There is abdominal tenderness in the epigastric area. There is no guarding or rebound.  Musculoskeletal:        General: Normal range of motion.     Right lower leg: No edema.     Left lower leg: No edema.  Skin:    General: Skin is warm and dry.     Capillary Refill: Capillary refill takes less than 2 seconds.  Neurological:     General: No focal deficit present.     Mental Status: She is alert and oriented to person, place, and time. Mental status is at baseline.  Psychiatric:        Mood and Affect: Mood normal.        Behavior: Behavior normal.        Thought Content: Thought content normal.        Judgment: Judgment normal.     ED Results / Procedures / Treatments   Labs (all labs ordered are listed, but only abnormal results are displayed) Labs Reviewed  COMPREHENSIVE METABOLIC PANEL - Abnormal; Notable for the following components:      Result Value   CO2 19 (*)    Glucose, Bld 115 (*)    All other components within normal limits  CBC - Abnormal; Notable for the following components:   WBC 11.2 (*)    All other components within normal limits  LIPASE, BLOOD  URINALYSIS, ROUTINE W REFLEX MICROSCOPIC  MAGNESIUM  TROPONIN I (HIGH SENSITIVITY)  TROPONIN I (HIGH SENSITIVITY)    EKG EKG Interpretation  Date/Time:  Wednesday January 03 2023 06:04:18 EST Ventricular Rate:  122 PR Interval:    QRS Duration: 79 QT Interval:  329 QTC Calculation: 469 R Axis:   69 Text Interpretation: Atrial fibrillation Confirmed by Dory Horn) on 01/03/2023 6:33:11 AM  Radiology DG Chest Port 1 View  Result Date:  01/03/2023 CLINICAL DATA:  47 year old female with abdominal pain, nausea vomiting diarrhea. Atrial fibrillation. EXAM: PORTABLE CHEST 1 VIEW COMPARISON:  Cardiac CT 09/26/2021 and earlier. FINDINGS: Portable AP semi upright view at 0732 hours. Lung volumes match that mildly lower lung volumes. Mild crowding of markings at the lung bases. Cardiac size and mediastinal contours remain normal. Visualized tracheal air column is within normal limits. No pneumothorax, pulmonary edema, pleural effusion. No acute osseous abnormality identified. Negative visible bowel gas. IMPRESSION: Lower lung volumes  with mild atelectasis. No other cardiopulmonary abnormality. Electronically Signed   By: Odessa Fleming M.D.   On: 01/03/2023 07:52    Procedures .Critical Care  Performed by: Cristopher Peru, PA-C Authorized by: Cristopher Peru, PA-C   Critical care provider statement:    Critical care time (minutes):  35   Critical care time was exclusive of:  Separately billable procedures and treating other patients   Critical care was necessary to treat or prevent imminent or life-threatening deterioration of the following conditions:  Cardiac failure   Critical care was time spent personally by me on the following activities:  Development of treatment plan with patient or surrogate, discussions with consultants, discussions with primary provider, evaluation of patient's response to treatment, examination of patient, interpretation of cardiac output measurements, obtaining history from patient or surrogate, review of old charts, re-evaluation of patient's condition, pulse oximetry, ordering and review of laboratory studies, ordering and performing treatments and interventions and ordering and review of radiographic studies   I assumed direction of critical care for this patient from another provider in my specialty: no      Medications Ordered in ED Medications  sodium chloride 0.9 % bolus 500 mL (0 mLs Intravenous Stopped  01/03/23 0757)  metoCLOPramide (REGLAN) injection 5 mg (5 mg Intravenous Given 01/03/23 0742)  diltiazem (CARDIZEM) injection 5 mg (5 mg Intravenous Given 01/03/23 0751)  sodium chloride 0.9 % bolus 500 mL (0 mLs Intravenous Stopped 01/03/23 0935)  diltiazem (CARDIZEM) injection 10 mg (10 mg Intravenous Given 01/03/23 0937)  ondansetron (ZOFRAN) injection 4 mg (4 mg Intravenous Given 01/03/23 1042)    ED Course/ Medical Decision Making/ A&P Clinical Course as of 01/03/23 1307  Wed Jan 03, 2023  0915 Nausea improved. Continues to be in RVR. Repeating cardizem. Consulting cards.  [LA]  [phone] Spoke with cards master Nicki [place]. Cards will see patient and advise on plan.  [LA]    Clinical Course User Index [LA] Cristopher Peru, PA-C        CHA2DS2-VASc Score: 1    HEART Score: 2                Medical Decision Making Amount and/or Complexity of Data Reviewed Labs: ordered. Radiology: ordered.  Risk Prescription drug management.  Initial Impression and Ddx 47 year old female who presents to the emergency department with nausea and vomiting last night now in atrial fibrillation with rapid ventricular response.  Blood pressure remained stable.  She is alert and oriented.  She has no symptoms of her A-fib.  Will obtain labs to rule out any abdominal concerns, giving IV push of Cardizem. Patient PMH that increases complexity of ED encounter: Paroxysmal atrial fibrillation, GERD Differential: Acute hepatobiliary disease, pancreatitis, appendicitis, PUD, gastritis, SBO, diverticulitis, colitis, viral gastroenteritis, Crohn's, UC, vascular catastrophe, UTI, pyelonephritis, renal stone, obstructed stone, infected stone, ovarian torsion, ectopic pregnancy, TOA, PID, STD, etc.   Interpretation of Diagnostics I independent reviewed and interpreted the labs as followed: CBC with mild leukocytosis of 11.2, likely reactive, lipase is negative, CMP without electrolyte derangement, AKI, transaminitis, UA is  negative, troponin x 2 is negative  - I independently visualized the following imaging with scope of interpretation limited to determining acute life threatening conditions related to emergency care: Chest x-ray, which revealed no acute findings  Patient Reassessment and Ultimate Disposition/Management Regarding nausea and vomiting, given Zofran and Reglan here in the emergency department with improvement in symptoms.  Abdomen is nonperitoneal.  There is no tenderness to palpation.  Able to tolerate p.o. without further episodes of nausea and vomiting. Likely viral gastroenteritis.  Labs and history and physical inconsistent with etiologies like acute hepatobiliary disease, lipase negative for pancreatitis.  Inconsistent with appendicitis, SBO, diverticulitis, vascular catastrophe.  UA is negative so doubt UA or pyelonephritis.  Symptoms inconsistent with stone, obstructed stone, infected stone, torsion, TOA, ectopic. Will discharge with Zofran prescription.  Regarding her atrial fibrillation with rapid ventricular response, likely induced by her acute illness.  Troponin x 2 negative, EKG without ischemia or infarction so doubt ACS as underlying reason.  Symptoms inconsistent with a dissection, tamponade, myocarditis, pericarditis.  Chest x-ray without pneumonia, pleural effusion, pneumothorax. She was initially given 5 mg IV push of Cardizem which did not improve her symptoms.  She was then given a 10 mg push of Cardizem.  At some point after this she converted to normal sinus rhythm.  She remained in normal sinus rhythm. Consulted and spoke with cardiology who evaluated her at the bedside.  I then spoke with Dr. Johney Frame, cardiology attending who evaluated the patient and from a cardiology standpoint is okay with discharge at this time.  Will refill her prescription for Cardizem and flecainide at home.  Is to follow-up with Dr. Percival Spanish in the outpatient setting.  Patient at this time is  asymptomatic, feeling improved.  Will discharge her with medications, follow-up and return precautions.  The patient has been appropriately medically screened and/or stabilized in the ED. I have low suspicion for any other emergent medical condition which would require further screening, evaluation or treatment in the ED or require inpatient management. At time of discharge the patient is hemodynamically stable and in no acute distress. I have discussed work-up results and diagnosis with patient and answered all questions. Patient is agreeable with discharge plan. We discussed strict return precautions for returning to the emergency department and they verbalized understanding.     Patient management required discussion with the following services or consulting groups:  Cardiology  Complexity of Problems Addressed Acute complicated illness or Injury  Additional Data Reviewed and Analyzed Further history obtained from: Past medical history and medications listed in the EMR, Prior ED visit notes, Recent PCP notes, Care Everywhere, and Prior labs/imaging results  Patient Encounter Risk Assessment Prescriptions, SDOH impact on management, and Consideration of hospitalization  Final Clinical Impression(s) / ED Diagnoses Final diagnoses:  Atrial fibrillation with RVR ([place])  Viral gastroenteritis    Rx / DC Orders ED Discharge Orders          Ordered    diltiazem (CARDIZEM) 30 MG tablet        01/03/23 1244    flecainide (TAMBOCOR) 150 MG tablet        01/03/23 1244    ondansetron (ZOFRAN) 4 MG tablet  Every 6 hours        01/03/23 1244    Ambulatory referral to Cardiology       Comments: If you have not heard from the Cardiology office within the next 72 hours please call [phone].   01/03/23 1245              Mickie Hillier, PA-C 01/03/23 1308    Valarie Merino, MD 01/06/23 1255

## 2023-01-03 NOTE — Telephone Encounter (Signed)
Reached out to patient to schedule ED f/u. Patient states that vomiting is one of her triggers for Afib and stated she will reach out to Dr. Percival Spanish office.

## 2023-01-03 NOTE — Discharge Instructions (Signed)
You were seen in the emergency department today for nausea, vomiting and atrial fibrillation.  I have represcribed your home diltiazem and flecainide.  Additionally I have prescribed you Zofran for nausea and vomiting.  You likely have a viral gastroenteritis.  Your heart is now in a normal rhythm.  Please return to the emergency department if you begin to have palpitations, shortness of breath, chest pain and feel that your rhythm is abnormal again.  Please call Dr. Percival Spanish office today to schedule follow-up appointment.

## 2023-01-10 ENCOUNTER — Encounter: Payer: Medicaid Other | Admitting: Family

## 2023-01-16 NOTE — Progress Notes (Unsigned)
Patient ID: Melanie Vincent, female    DOB: 1976-10-24  MRN: [id number]  CC: Annual Physical Exam  Subjective: Melanie Vincent is a 47 y.o. female who presents for annual physical exam.   Her concerns today include:  Mammo   Patient Active Problem List   Diagnosis Date Noted   Paresthesia 08/21/2022   Bacterial vaginitis 05/26/2021   Lumbar degenerative disc disease 05/25/2021   Educated about COVID-19 virus infection 06/13/2020   Precordial chest pain 06/13/2020   Sleep apnea suspected 10/06/2015   Anxiety reaction 09/15/2015   PAF (paroxysmal atrial fibrillation) (HCC) 11/25/2014   Herpes labialis 07/29/2013   Well woman exam (no gynecological exam) 05/02/2013   GERD (gastroesophageal reflux disease) 11/13/2012   Overweight 06/14/2012     Current Outpatient Medications on File Prior to Visit  Medication Sig Dispense Refill   cyclobenzaprine (FLEXERIL) 10 MG tablet Take 1 tablet (10 mg total) by mouth 3 (three) times daily as needed for muscle spasms. 90 tablet 1   diltiazem (CARDIZEM) 30 MG tablet Take 1 tablet every 4 hours AS NEEDED for heart rate >100 30 tablet 3   flecainide (TAMBOCOR) 150 MG tablet Take 1-2 tablets by mouth once for afib can only use every 4 days 6 tablet 1   gabapentin (NEURONTIN) 100 MG capsule Take 3 capsules (300 mg total) by mouth at bedtime as needed. 90 capsule 6   gabapentin (NEURONTIN) 300 MG capsule Take 1 capsule (300 mg total) by mouth at bedtime. 30 capsule 0   ondansetron (ZOFRAN) 4 MG tablet Take 1 tablet (4 mg total) by mouth every 6 (six) hours. 12 tablet 0   pantoprazole (PROTONIX) 40 MG tablet Take 1 tablet (40 mg total) by mouth daily. 30 tablet 5   sucralfate (CARAFATE) 1 GM/10ML suspension Take 10 mLs (1 g total) by mouth 4 (four) times daily -  with meals and at bedtime. 420 mL 3   valACYclovir (VALTREX) 1000 MG tablet Take 1 tablet (1,000 mg total) by mouth 2 (two) times daily as needed (for cold sores). 30 tablet 1   No  current facility-administered medications on file prior to visit.    Allergies  Allergen Reactions   Zofran [Ondansetron Hcl] Itching    Social History   Socioeconomic History   Marital status: Single    Spouse name: Not on file   Number of children: 4   Years of education: Not on file   Highest education level: Not on file  Occupational History   Occupation: Caregiver  Tobacco Use   Smoking status: Former    Packs/day: 0.25    Years: 5.00    Total pack years: 1.25    Types: Cigarettes    Quit date: 06/15/2007    Years since quitting: 15.6    Passive exposure: Past   Smokeless tobacco: Never  Vaping Use   Vaping Use: Some days   Substances: CBD   Devices: smoked CBD vape occ  Substance and Sexual Activity   Alcohol use: Yes    Alcohol/week: 4.0 standard drinks of alcohol    Types: 2 Glasses of wine, 2 Standard drinks or equivalent per week    Comment: 3 drinks per week   Drug use: Yes    Types: Marijuana   Sexual activity: Yes    Birth control/protection: Surgical, Condom  Other Topics Concern   Not on file  Social History Narrative   Lives in [place] with 3 children and has two others.  Single.   Works as a Clinical biochemist (travel)   Social Determinants of Community education officer: Not on Ship broker Insecurity: Not on file  Transportation Needs: Not on file  Physical Activity: Not on file  Stress: Not on file  Social Connections: Not on file  Intimate Partner Violence: Not on file    Family History  Problem Relation Age of Onset   Diabetes Father    Hypertension Father    Stroke Father    Colon polyps Father    Breast cancer Half-Sister 36   Diabetes Paternal Grandmother    Heart disease Paternal Grandmother    Diabetes Paternal Grandfather    Heart disease Paternal Grandfather    Colon cancer Paternal Aunt        late 70's   Breast cancer Maternal Aunt    Esophageal cancer Neg Hx    Rectal cancer Neg Hx    Stomach cancer  Neg Hx     Past Surgical History:  Procedure Laterality Date   CESAREAN SECTION     TUBAL LIGATION     2008    ROS: Review of Systems Negative except as stated above  PHYSICAL EXAM: There were no vitals taken for this visit.  Physical Exam  {female adult master:310786} {female adult master:310785}     Latest Ref Rng & Units 01/03/2023    4:09 AM 05/22/2022    2:08 AM 09/01/2021    1:08 PM  CMP  Glucose 70 - 99 mg/dL 161  99  87   BUN 6 - 20 mg/dL 14  15  12    Creatinine 0.44 - 1.00 mg/dL 0.96  0.45  4.09   Sodium 135 - 145 mmol/L 137  138  137   Potassium 3.5 - 5.1 mmol/L 3.9  4.1  4.2   Chloride 98 - 111 mmol/L 107  110  104   CO2 22 - 32 mmol/L 19  21  24    Calcium 8.9 - 10.3 mg/dL 9.0  8.9  9.3   Total Protein 6.5 - 8.1 g/dL 7.1     Total Bilirubin 0.3 - 1.2 mg/dL 0.4     Alkaline Phos 38 - 126 U/L 47     AST 15 - 41 U/L 25     ALT 0 - 44 U/L 21      Lipid Panel     Component Value Date/Time   CHOL 214 (H) 05/25/2021 1006   TRIG 65 05/25/2021 1006   HDL 56 05/25/2021 1006   CHOLHDL 3.8 05/25/2021 1006   CHOLHDL 3.7 Ratio 06/01/2009 2050   VLDL 12 06/01/2009 2050   LDLCALC 146 (H) 05/25/2021 1006    CBC    Component Value Date/Time   WBC 11.2 (H) 01/03/2023 0409   RBC 4.62 01/03/2023 0409   HGB 13.7 01/03/2023 0409   HGB 12.4 08/24/2020 1202   HCT 41.0 01/03/2023 0409   HCT 38.8 08/24/2020 1202   PLT 360 01/03/2023 0409   PLT 403 08/24/2020 1202   MCV 88.7 01/03/2023 0409   MCV 92 08/24/2020 1202   MCH 29.7 01/03/2023 0409   MCHC 33.4 01/03/2023 0409   RDW 13.9 01/03/2023 0409   RDW 13.2 08/24/2020 1202   LYMPHSABS 2.2 05/22/2022 0208   LYMPHSABS 2.8 08/24/2020 1202   MONOABS 0.5 05/22/2022 0208   EOSABS 0.1 05/22/2022 0208   EOSABS 0.1 08/24/2020 1202   BASOSABS 0.0 05/22/2022 0208   BASOSABS 0.0 08/24/2020 1202    ASSESSMENT  AND PLAN:  There are no diagnoses linked to this encounter.   Patient was given the opportunity to ask  questions.  Patient verbalized understanding of the plan and was able to repeat key elements of the plan. Patient was given clear instructions to go to Emergency Department or return to medical center if symptoms don't improve, worsen, or new problems develop.The patient verbalized understanding.   No orders of the defined types were placed in this encounter.    Requested Prescriptions    No prescriptions requested or ordered in this encounter    No follow-ups on file.  Rema Fendt, NP

## 2023-01-18 ENCOUNTER — Encounter (HOSPITAL_COMMUNITY): Payer: Self-pay | Admitting: *Deleted

## 2023-01-18 ENCOUNTER — Encounter: Payer: Medicaid Other | Admitting: Family

## 2023-01-18 DIAGNOSIS — R7303 Prediabetes: Secondary | ICD-10-CM

## 2023-01-18 DIAGNOSIS — Z0000 Encounter for general adult medical examination without abnormal findings: Secondary | ICD-10-CM

## 2023-01-18 DIAGNOSIS — Z13220 Encounter for screening for lipoid disorders: Secondary | ICD-10-CM

## 2023-01-18 DIAGNOSIS — Z1329 Encounter for screening for other suspected endocrine disorder: Secondary | ICD-10-CM

## 2023-01-18 DIAGNOSIS — Z1231 Encounter for screening mammogram for malignant neoplasm of breast: Secondary | ICD-10-CM

## 2023-01-25 ENCOUNTER — Ambulatory Visit: Payer: Medicaid Other | Attending: Nurse Practitioner | Admitting: Nurse Practitioner

## 2023-01-25 NOTE — Progress Notes (Deleted)
Office Visit    Patient Name: Melanie Vincent Date of Encounter: 01/25/2023  Primary Care Provider:  Camillia Herter, NP Primary Cardiologist:  Minus Breeding, MD  Chief Complaint    47 year old female with a history of paroxysmal atrial fibrillation, and GERD who presents for hospital follow-up related to atrial fibrillation.  Past Medical History    Past Medical History:  Diagnosis Date   Anxiety    Breast lump    Mammogram (last 2013)   DDD (degenerative disc disease), lumbar    GERD (gastroesophageal reflux disease)    Herpes simplex labialis    PAF (paroxysmal atrial fibrillation) ([place])    Past Surgical History:  Procedure Laterality Date   CESAREAN SECTION     TUBAL LIGATION     2008    Allergies  Allergies  Allergen Reactions   Zofran [Ondansetron Hcl] Itching     Labs/Other Studies Reviewed    The following studies were reviewed today: Coronary CT 09/26/2021: Coronary Arteries:  Normal coronary origin.  Right dominance.   Left main: The left main is a large caliber vessel with a normal take off from the left coronary cusp that bifurcates to form a left anterior descending artery and a left circumflex artery. There is no plaque or stenosis.   Left anterior descending artery: The LAD is patent without evidence of plaque or stenosis. The LAD gives off 2 patent diagonal branches.   Left circumflex artery: The LCX is non-dominant and patent with no evidence of plaque or stenosis. The LCX gives off 1 patent obtuse marginal branch.   Right coronary artery: The RCA is dominant with normal take off from the right coronary cusp. There is no evidence of plaque or stenosis. The RCA terminates as a PDA and right posterolateral branch without evidence of plaque or stenosis.   Right Atrium: Right atrial size is within normal limits.   Right Ventricle: The right ventricular cavity is within normal limits.   Left Atrium: Left atrial size is normal in size  with no left atrial appendage filling defect.   Left Ventricle: The ventricular cavity size is within normal limits. There are no stigmata of prior infarction. There is no abnormal filling defect.   Pulmonary arteries: Normal in size without proximal filling defect.   Pulmonary veins: Normal pulmonary venous drainage.   Pericardium: Normal thickness with no significant effusion or calcium present.   Cardiac valves: The aortic valve is trileaflet without significant calcification. The mitral valve is normal structure without significant calcification.   Aorta: Normal caliber with no significant disease.   Small PFO   Extra-cardiac findings: See attached radiology report for non-cardiac structures.   IMPRESSION: 1. Coronary calcium score of 0. This was 0 percentile for age-, sex, and race-matched controls.   2. Normal coronary origin with right dominance.   3. Normal coronary arteries.  Recent Labs: 08/21/2022: TSH 0.613 01/03/2023: ALT 21; BUN 14; Creatinine, Ser 0.70; Hemoglobin 13.7; Magnesium 2.0; Platelets 360; Potassium 3.9; Sodium 137  Recent Lipid Panel    Component Value Date/Time   CHOL 214 (H) 05/25/2021 1006   TRIG 65 05/25/2021 1006   HDL 56 05/25/2021 1006   CHOLHDL 3.8 05/25/2021 1006   CHOLHDL 3.7 Ratio 06/01/2009 2050   VLDL 12 06/01/2009 2050   [place] 146 (H) 05/25/2021 1006    History of Present Illness    47 year old female with the above past medical history including paroxysmal atrial fibrillation, and GERD.  Coronary CTA in 09/2021 revealed  coronary calcium score of 0, normal coronary arteries, no evidence of CAD.  She was initially diagnosed with atrial fibrillation in 2015 and was placed on as needed diltiazem and pill in the pocket flecainide.  She was last seen in the office on 11/25/2021 and was doing well from a cardiac standpoint.  She noted rare intermittent intermittent palpitations.  She has not been on anticoagulation (CHA2DS2VASc =  1) in the setting of low a fib burden, low CHA2DS2VASc. She presented to the ED on 01/03/2023 in the setting of atrial fibrillation with RVR. Cardiology was consulted.  She also noted left arm pain during her episode of atrial fibrillation.  Troponin was negative x 2.  Her heart rate improved and she converted to NSR with IV diltiazem.  No changes were made to her medication regimen.  She was discharged home in stable condition on 01/03/2023.  She presents today for follow-up.  Since her recent ED visit  Paroxysmal atrial fibrillation: Left arm pain: GERD: Disposition:   Home Medications    Current Outpatient Medications  Medication Sig Dispense Refill   cyclobenzaprine (FLEXERIL) 10 MG tablet Take 1 tablet (10 mg total) by mouth 3 (three) times daily as needed for muscle spasms. 90 tablet 1   diltiazem (CARDIZEM) 30 MG tablet Take 1 tablet every 4 hours AS NEEDED for heart rate >100 30 tablet 3   flecainide (TAMBOCOR) 150 MG tablet Take 1-2 tablets by mouth once for afib can only use every 4 days 6 tablet 1   gabapentin (NEURONTIN) 100 MG capsule Take 3 capsules (300 mg total) by mouth at bedtime as needed. 90 capsule 6   gabapentin (NEURONTIN) 300 MG capsule Take 1 capsule (300 mg total) by mouth at bedtime. 30 capsule 0   ondansetron (ZOFRAN) 4 MG tablet Take 1 tablet (4 mg total) by mouth every 6 (six) hours. 12 tablet 0   pantoprazole (PROTONIX) 40 MG tablet Take 1 tablet (40 mg total) by mouth daily. 30 tablet 5   sucralfate (CARAFATE) 1 GM/10ML suspension Take 10 mLs (1 g total) by mouth 4 (four) times daily -  with meals and at bedtime. 420 mL 3   valACYclovir (VALTREX) 1000 MG tablet Take 1 tablet (1,000 mg total) by mouth 2 (two) times daily as needed (for cold sores). 30 tablet 1   No current facility-administered medications for this visit.     Review of Systems    ***.  All other systems reviewed and are otherwise negative except as noted above.    Physical Exam    VS:   There were no vitals taken for this visit. , BMI There is no height or weight on file to calculate BMI.     GEN: Well nourished, well developed, in no acute distress. HEENT: normal. Neck: Supple, no JVD, carotid bruits, or masses. Cardiac: RRR, no murmurs, rubs, or gallops. No clubbing, cyanosis, edema.  Radials/DP/PT 2+ and equal bilaterally.  Respiratory:  Respirations regular and unlabored, clear to auscultation bilaterally. GI: Soft, nontender, nondistended, BS + x 4. MS: no deformity or atrophy. Skin: warm and dry, no rash. Neuro:  Strength and sensation are intact. Psych: Normal affect.  Accessory Clinical Findings    ECG personally reviewed by me today - *** - no acute changes.   Lab Results  Component Value Date   WBC 11.2 (H) 01/03/2023   HGB 13.7 01/03/2023   HCT 41.0 01/03/2023   MCV 88.7 01/03/2023   PLT 360 01/03/2023   Lab Results  Component Value Date   CREATININE 0.70 01/03/2023   BUN 14 01/03/2023   NA 137 01/03/2023   K 3.9 01/03/2023   CL 107 01/03/2023   CO2 19 (L) 01/03/2023   Lab Results  Component Value Date   ALT 21 01/03/2023   AST 25 01/03/2023   ALKPHOS 47 01/03/2023   BILITOT 0.4 01/03/2023   Lab Results  Component Value Date   CHOL 214 (H) 05/25/2021   HDL 56 05/25/2021   LDLCALC 146 (H) 05/25/2021   TRIG 65 05/25/2021   CHOLHDL 3.8 05/25/2021    Lab Results  Component Value Date   HGBA1C 5.8 (H) 05/24/2022    Assessment & Plan    1.  ***  No BP recorded.  {Refresh Note OR Click here to enter BP  :1}***   Lenna Sciara, NP 01/25/2023, 5:03 AM

## 2023-02-07 ENCOUNTER — Emergency Department (HOSPITAL_COMMUNITY)
Admission: EM | Admit: 2023-02-07 | Discharge: 2023-02-08 | Disposition: A | Discharge status: Home / Self Care | Payer: Medicaid Other | Attending: Emergency Medicine | Admitting: Emergency Medicine

## 2023-02-07 ENCOUNTER — Emergency Department (HOSPITAL_COMMUNITY): Payer: Medicaid Other

## 2023-02-07 DIAGNOSIS — R0602 Shortness of breath: Secondary | ICD-10-CM | POA: Diagnosis not present

## 2023-02-07 DIAGNOSIS — R000 Tachycardia, unspecified: Secondary | ICD-10-CM | POA: Diagnosis not present

## 2023-02-07 DIAGNOSIS — R079 Chest pain, unspecified: Secondary | ICD-10-CM | POA: Diagnosis present

## 2023-02-07 LAB — BASIC METABOLIC PANEL
Anion gap: 10 (ref 5–15)
BUN: 11 mg/dL (ref 6–20)
CO2: 22 mmol/L (ref 22–32)
Calcium: 9.1 mg/dL (ref 8.9–10.3)
Chloride: 104 mmol/L (ref 98–111)
Creatinine, Ser: 0.76 mg/dL (ref 0.44–1.00)
GFR, Estimated: >60 mL/min (ref >60)
Glucose, Bld: 90 mg/dL (ref 70–99)
Potassium: 3.4 mmol/L — ABNORMAL LOW (ref 3.5–5.1)
Sodium: 136 mmol/L (ref 135–145)

## 2023-02-07 LAB — CBC
HCT: 38 % (ref 36.0–46.0)
Hemoglobin: 12.8 g/dL (ref 12.0–15.0)
MCH: 29.6 pg (ref 26.0–34.0)
MCHC: 33.7 g/dL (ref 30.0–36.0)
MCV: 88 fL (ref 80.0–100.0)
Platelets: 339 10*3/uL (ref 150–400)
RBC: 4.32 MIL/uL (ref 3.87–5.11)
RDW: 14.2 % (ref 11.5–15.5)
WBC: 8.4 10*3/uL (ref 4.0–10.5)
nRBC: 0 % (ref 0.0–0.2)

## 2023-02-07 LAB — TROPONIN I (HIGH SENSITIVITY): Troponin I (High Sensitivity): 3 ng/L (ref <18)

## 2023-02-07 NOTE — ED Triage Notes (Addendum)
Pt to ED C/O sudden onset chest pain, "something sitting on chest" that occurred when watching tv, reports intermittent in nature. No s/s of acute distress noted in triage. Reports no aggravating or alleviating factors. Reports feeling anxious. Hx AFIB

## 2023-02-07 NOTE — ED Provider Triage Note (Signed)
Emergency Medicine Provider Triage Evaluation Note  Melanie Vincent , a 47 y.o. female  was evaluated in triage.  Pt complains of sudden onset intermittent chest pain onset 1 hour. Notes that she was watching TV when her symptoms started. Has associated nausea. Took too 81 mg ASA PTA. Has been feeling anxious. Notes that her blood pressure was elevated to 99991111 systolic and elevated heart rate above 120s PTA. Has a history of AF. No history of MI, DM, HTN, stent placement.  Review of Systems  Positive:  Negative:   Physical Exam  BP (!) 162/105 (BP Location: Right Arm)   Pulse 79   Temp (!) 97.5 F (36.4 C) (Oral)   Resp 19   Ht '5\' 6"'$  (1.676 m)   Wt 90.7 kg   LMP 02/07/2023   SpO2 99%   BMI 32.28 kg/m  Gen:   Awake, no distress   Resp:  Normal effort  MSK:   Moves extremities without difficulty  Other:  No chest wall or abdominal TTP.   Medical Decision Making  Medically screening exam initiated at 9:32 PM.  Appropriate orders placed.  Melanie Vincent was informed that the remainder of the evaluation will be completed by another provider, this initial triage assessment does not replace that evaluation, and the importance of remaining in the ED until their evaluation is complete.  8:59 PM - Discussed with RN that patient is in need of a room immediately. RN aware and working on room placement.    Melanie Vincent A, PA-C 02/07/23 2132

## 2023-02-08 ENCOUNTER — Encounter (HOSPITAL_COMMUNITY): Payer: Self-pay | Admitting: Emergency Medicine

## 2023-02-08 ENCOUNTER — Other Ambulatory Visit: Payer: Self-pay | Admitting: Family

## 2023-02-08 ENCOUNTER — Other Ambulatory Visit: Payer: Self-pay

## 2023-02-08 DIAGNOSIS — M5136 Other intervertebral disc degeneration, lumbar region: Secondary | ICD-10-CM

## 2023-02-08 LAB — TROPONIN I (HIGH SENSITIVITY): Troponin I (High Sensitivity): <2 ng/L (ref <18)

## 2023-02-08 LAB — D-DIMER, QUANTITATIVE: D-Dimer, Quant: 0.39 ug/mL-FEU (ref 0.00–0.50)

## 2023-02-08 NOTE — Discharge Instructions (Signed)
What is Spice/ K2, Synthetic Marijuana? K2 and Spice are just two of the many trade names or brands for synthetic designer drugs that are intended to mimic THC, the main psychoactive ingredient of marijuana. These designer synthetic drugs are from the synthetic cannabinoid class of drugs that are often marketed and sold under the guise of "herbal incense" or "potpourri."  These products are being abused for their psychoactive properties and are packaged without information as to their health and safety risks.  Street Title Spice, K2, RedX [place], [place], [place], Black [place], [employer], [place], [place], [place], [place], [place], [place], Smoke, [place], [place], [place], Estate agent, [place], and Librarian, academic.  How is it abused? Spraying or mixing the synthetic cannabinoids on plant material provides a vehicle for the most common route of administration - smoking (using a pipe, a water pipe, or rolling the drug-laced plant material in cigarette papers). In addition to the cannabinoids laced on plant material and sold as potpourri and incense, liquid cannabinoids have been designed to be vaporized through both disposable and reusable electronic cigarettes.  What are its effect on the body? State public health and poison centers have issued warnings in response to adverse health effects associated with abuse of herbal incense products containing these synthetic cannabinoids. These adverse effects included tachycardia (elevated heart rate), elevated blood pressure, unconsciousness, tremors, seizures, vomiting, hallucinations, agitation, anxiety, pallor, numbness, and tingling. This is in addition to the numerous public health and poison centers which have similarly issued warnings regarding the abuse of these synthetic cannabinoids. In some instances, the adverse health effects can be long-lasting even after the user quits using the substances.

## 2023-02-08 NOTE — ED Provider Notes (Signed)
[facility] Provider Note  CSN: [id number] [place] date & time: 02/07/23 2051  Chief Complaint(s) Chest Pain  HPI Melanie Vincent is a 47 y.o. female with a past medical history listed below including paroxysmal A-fib not on any anticoagulation at this time who presents to the emergency department for sudden onset shortness of breath and tachycardia around 9 or 10 PM.  The pain was tight pressure in the central region that was constant since onset.  No alleviating or aggravating factors.  She also endorses some shortness of breath.  States that her heart rate went up to the 120s and her blood pressure was in the 140s to 160s.  She reports that this began less than an hour after smoking synthetic THC given to her by her neighbor.  Since arriving, her symptoms have improved significantly.  Heart rate is now normal and her chest discomfort is mild.  She denies any recent fevers or infections.  No coughing or congestion.  No OCP use.  No recent travel.  The history is provided by the patient.    Past Medical History Past Medical History:  Diagnosis Date   Anxiety    Breast lump    Mammogram (last 2013)   DDD (degenerative disc disease), lumbar    GERD (gastroesophageal reflux disease)    Herpes simplex labialis    PAF (paroxysmal atrial fibrillation) ([place])    Patient Active Problem List   Diagnosis Date Noted   Paresthesia 08/21/2022   Bacterial vaginitis 05/26/2021   Lumbar degenerative disc disease 05/25/2021   Educated about COVID-19 virus infection 06/13/2020   Precordial chest pain 06/13/2020   Sleep apnea suspected 10/06/2015   Anxiety reaction 09/15/2015   PAF (paroxysmal atrial fibrillation) ([place]) 11/25/2014   Herpes labialis 07/29/2013   Well woman exam (no gynecological exam) 05/02/2013   GERD (gastroesophageal reflux disease) 11/13/2012   Overweight 06/14/2012   Home Medication(s) Prior to Admission medications    Medication Sig Start Date End Date Taking? Authorizing Provider  cyclobenzaprine (FLEXERIL) 10 MG tablet Take 1 tablet (10 mg total) by mouth 3 (three) times daily as needed for muscle spasms. 05/25/21   Camillia Herter, NP  diltiazem (CARDIZEM) 30 MG tablet Take 1 tablet every 4 hours AS NEEDED for heart rate >100 01/03/23   Mickie Hillier, PA-C  flecainide (TAMBOCOR) 150 MG tablet Take 1-2 tablets by mouth once for afib can only use every 4 days 01/03/23   Mickie Hillier, PA-C  gabapentin (NEURONTIN) 100 MG capsule Take 3 capsules (300 mg total) by mouth at bedtime as needed. 08/21/22   Marcial Pacas, MD  gabapentin (NEURONTIN) 300 MG capsule Take 1 capsule (300 mg total) by mouth at bedtime. 05/17/22 06/16/22  Camillia Herter, NP  ondansetron (ZOFRAN) 4 MG tablet Take 1 tablet (4 mg total) by mouth every 6 (six) hours. 01/03/23   Mickie Hillier, PA-C  pantoprazole (PROTONIX) 40 MG tablet Take 1 tablet (40 mg total) by mouth daily. 06/16/22   Maryjane Hurter, MD  sucralfate (CARAFATE) 1 GM/10ML suspension Take 10 mLs (1 g total) by mouth 4 (four) times daily -  with meals and at bedtime. 05/24/22   Camillia Herter, NP  valACYclovir (VALTREX) 1000 MG tablet Take 1 tablet (1,000 mg total) by mouth 2 (two) times daily as needed (for cold sores). 05/25/21   Camillia Herter, NP  Allergies Zofran [ondansetron hcl]  Review of Systems Review of Systems As noted in HPI  Physical Exam Vital Signs  I have reviewed the triage vital signs BP 114/66   Pulse 68   Temp 97.6 F (36.4 C) (Oral)   Resp (!) 21   Ht '5\' 6"'$  (1.676 m)   Wt 90.7 kg   LMP 02/07/2023   SpO2 100%   BMI 32.28 kg/m   Physical Exam Vitals reviewed.  Constitutional:      General: She is not in acute distress.    Appearance: She is well-developed. She is not diaphoretic.  HENT:     Head: Normocephalic and  atraumatic.     Nose: Nose normal.  Eyes:     General: No scleral icterus.       Right eye: No discharge.        Left eye: No discharge.     Conjunctiva/sclera: Conjunctivae normal.     Pupils: Pupils are equal, round, and reactive to light.  Cardiovascular:     Rate and Rhythm: Normal rate and regular rhythm.     Heart sounds: No murmur heard.    No friction rub. No gallop.  Pulmonary:     Effort: Pulmonary effort is normal. No respiratory distress.     Breath sounds: Normal breath sounds. No stridor. No rales.  Abdominal:     General: There is no distension.     Palpations: Abdomen is soft.     Tenderness: There is no abdominal tenderness.  Musculoskeletal:        General: No tenderness.     Cervical back: Normal range of motion and neck supple.  Skin:    General: Skin is warm and dry.     Findings: No erythema or rash.  Neurological:     Mental Status: She is alert and oriented to person, place, and time.     ED Results and Treatments Labs (all labs ordered are listed, but only abnormal results are displayed) Labs Reviewed  BASIC METABOLIC PANEL - Abnormal; Notable for the following components:      Result Value   Potassium 3.4 (*)    All other components within normal limits  CBC  D-DIMER, QUANTITATIVE  TROPONIN I (HIGH SENSITIVITY)  TROPONIN I (HIGH SENSITIVITY)                                                                                                                         EKG  EKG Interpretation  Date/Time:  Wednesday February 07 2023 21:00:12 EST Ventricular Rate:  81 PR Interval:  166 QRS Duration: 76 QT Interval:  394 QTC Calculation: 457 R Axis:   65 Text Interpretation: Normal sinus rhythm Possible Left atrial enlargement Borderline ECG When compared with ECG of 03-Jan-2023 11:33, PREVIOUS ECG IS PRESENT No significant change was found Confirmed by Addison Lank [phone]) on 02/08/2023 12:16:43 AM       Radiology DG Chest Port 1  View  Result Date: 02/07/2023  CLINICAL DATA:  Chest pain EXAM: PORTABLE CHEST 1 VIEW COMPARISON:  Chest x-ray 01/03/2023 FINDINGS: The heart size and mediastinal contours are within normal limits. There are linear bands of atelectasis in the lower lungs. The lungs are otherwise clear. There is no pleural effusion or pneumothorax. Visualized skeletal structures are unremarkable. IMPRESSION: Linear bands of atelectasis in the lower lungs. Electronically Signed   By: Ronney Asters M.D.   On: 02/07/2023 21:20    Medications Ordered in ED Medications - No data to display                                                                                                                                   Procedures Procedures  (including critical care time)  Medical Decision Making / ED Course  Click here for ABCD2, HEART and other calculators  Medical Decision Making Amount and/or Complexity of Data Reviewed Labs: ordered. Decision-making details documented in ED Course. Radiology: ordered and independent interpretation performed. Decision-making details documented in ED Course. ECG/medicine tests: ordered and independent interpretation performed. Decision-making details documented in ED Course.    This patient presents to the ED for concern of chest pain with SOB and tachycardia, this involves an extensive number of treatment options, and is a complaint that carries with it a high risk of complications and morbidity. The differential diagnosis includes but not limited to tachydysrhythmia, side effects from synthetic THC, anxiety, PE, ACS.  EKG without acute ischemic changes, dysrhythmias or blocks. Heart score less than 3.  Serial troponins negative x 2 ruling out ACS.  CBC without leukocytosis or anemia. Metabolic panel without significant electrolyte derangements or renal sufficiency.  Low pretest probability for PE.  Dimer negative.  PE unlikely.  Chest x-ray without evidence of pneumonia,  pneumothorax, pulmonary edema or pleural effusions.  Mild basilar atelectasis.  Patient was allowed to rest and metabolize. After several hours, patient reported significant relief without intervention.      Final Clinical Impression(s) / ED Diagnoses Final diagnoses:  Nonspecific chest pain   The patient appears reasonably screened and/or stabilized for discharge and I doubt any other medical condition or other [facility] requiring further screening, evaluation, or treatment in the ED at this time. I have discussed the findings, Dx and Tx plan with the patient/family who expressed understanding and agree(s) with the plan. Discharge instructions discussed at length. The patient/family was given strict return precautions who verbalized understanding of the instructions. No further questions at time of discharge.  Disposition: Discharge  Condition: Good  ED Discharge Orders     None       Follow Up: Camillia Herter, NP [place] [address] [phone]  Call  as needed           This chart was dictated using voice recognition software.  Despite best efforts to proofread,  errors can occur which can change the documentation meaning.  Fatima Blank, MD 02/08/23 [phone]

## 2023-02-08 NOTE — ED Notes (Signed)
Got report from off going nurse. Patient moved into room. Patient hooked up to monitor.

## 2023-02-09 NOTE — Telephone Encounter (Signed)
Requested medication (s) are due for refill today: Yes  Requested medication (s) are on the active medication list: Yes  Last refill:  05/25/21  Future visit scheduled: No  Notes to clinic:  Prescription has expired.    Requested Prescriptions  Pending Prescriptions Disp Refills   cyclobenzaprine (FLEXERIL) 10 MG tablet 90 tablet 1    Sig: Take 1 tablet (10 mg total) by mouth 3 (three) times daily as needed for muscle spasms.     Not Delegated - Analgesics:  Muscle Relaxants Failed - 02/08/2023  4:40 PM      Failed - This refill cannot be delegated      Failed - Valid encounter within last 6 months    Recent Outpatient Visits           8 months ago Neuropathic pain   [facility], [place], NP   1 year ago Right elbow pain   [facility], [place], NP   1 year ago Annual physical exam   [facility], [place], NP   2 years ago Encounter to establish care   [facility] [place], NP   3 years ago Chronic RUQ pain   [facility], [place], NP

## 2023-02-12 ENCOUNTER — Other Ambulatory Visit: Payer: Self-pay

## 2023-02-15 ENCOUNTER — Other Ambulatory Visit: Payer: Self-pay

## 2023-02-15 ENCOUNTER — Emergency Department (HOSPITAL_BASED_OUTPATIENT_CLINIC_OR_DEPARTMENT_OTHER): Payer: Medicaid Other

## 2023-02-15 ENCOUNTER — Ambulatory Visit: Payer: Self-pay | Admitting: *Deleted

## 2023-02-15 ENCOUNTER — Encounter (HOSPITAL_BASED_OUTPATIENT_CLINIC_OR_DEPARTMENT_OTHER): Payer: Self-pay | Admitting: Emergency Medicine

## 2023-02-15 DIAGNOSIS — Z79899 Other long term (current) drug therapy: Secondary | ICD-10-CM | POA: Insufficient documentation

## 2023-02-15 DIAGNOSIS — I10 Essential (primary) hypertension: Secondary | ICD-10-CM | POA: Insufficient documentation

## 2023-02-15 DIAGNOSIS — R519 Headache, unspecified: Secondary | ICD-10-CM | POA: Diagnosis present

## 2023-02-15 LAB — CBC WITH DIFFERENTIAL/PLATELET
Abs Immature Granulocytes: 0.02 10*3/uL (ref 0.00–0.07)
Basophils Absolute: 0 10*3/uL (ref 0.0–0.1)
Basophils Relative: 0 %
Eosinophils Absolute: 0.1 10*3/uL (ref 0.0–0.5)
Eosinophils Relative: 1 %
HCT: 39.6 % (ref 36.0–46.0)
Hemoglobin: 13 g/dL (ref 12.0–15.0)
Immature Granulocytes: 0 %
Lymphocytes Relative: 29 %
Lymphs Abs: 2.6 10*3/uL (ref 0.7–4.0)
MCH: 29.2 pg (ref 26.0–34.0)
MCHC: 32.8 g/dL (ref 30.0–36.0)
MCV: 89 fL (ref 80.0–100.0)
Monocytes Absolute: 0.5 10*3/uL (ref 0.1–1.0)
Monocytes Relative: 5 %
Neutro Abs: 5.6 10*3/uL (ref 1.7–7.7)
Neutrophils Relative %: 65 %
Platelets: 405 10*3/uL — ABNORMAL HIGH (ref 150–400)
RBC: 4.45 MIL/uL (ref 3.87–5.11)
RDW: 14.2 % (ref 11.5–15.5)
WBC: 8.8 10*3/uL (ref 4.0–10.5)
nRBC: 0 % (ref 0.0–0.2)

## 2023-02-15 NOTE — ED Triage Notes (Addendum)
Pt in with increasing blood pressure over the last few days at home (180/120's). Pt states this afternoon, she developed a sudden onset R sided HA around 3pm, then vomited multiple times around 4:30pm. BP 135/95 in triage. Reporting nausea and upper abdominal pain, and blurred vision

## 2023-02-15 NOTE — Telephone Encounter (Signed)
Reason for Disposition  Systolic BP  >= 0000000 OR Diastolic >= [id number]  Answer Assessment - Initial Assessment Questions 1. BLOOD PRESSURE: "What is the blood pressure?" "Did you take at least two measurements 5 minutes apart?"    170/100 this 2. ONSET: "When did you take your blood pressure?"     This morning 3. HOW: "How did you take your blood pressure?" (e.g., automatic home BP monitor, visiting nurse)     Home kit Last week I was short of breath all of a sudden.    My chest started hurting.   BP 170/100 then.    I was scarred.    I went to ED last week.    I was there all night.   My BP came down.    No heart attack.   Not sure why BP high. Last night short of breath.    BP 160/100 sitting on the edge of my bed.    I've not been up. I feel dizzy this morning too.    I had a headache yesterday but not today.   I feel lethargic.  4. HISTORY: "Do you have a history of high blood pressure?"     No 5. MEDICINES: "Are you taking any medicines for blood pressure?" "Have you missed any doses recently?"     No 6. OTHER SYMPTOMS: "Do you have any symptoms?" (e.g., blurred vision, chest pain, difficulty breathing, headache, weakness)     Dizziness, not too much discomfort this morning in my chest.   I'm still in bed.   I don't feel light headed, but slightly dizzy.      In ED they did an D dimmer and troponin's are they were fine. 7. PREGNANCY: "Is there any chance you are pregnant?" "When was your last menstrual period?"     Not asked  Protocols used: Blood Pressure - High-A-AH

## 2023-02-15 NOTE — Telephone Encounter (Signed)
  Chief Complaint: Elevated BP and dizziness, lethargic.   Seen in ED 02/07/2023 with chest pain, shortness of breath, sweating, dizziness.   Everything checked out ok with troponins and D dimer.  History of A. Fib.   Was NSR in ED per pt. Symptoms: Dizziness, headaches, chest pain, BP very elevated.  BP 170/100 this morning. Frequency: Since last week. Pertinent Negatives: Patient denies chest pain this morning or sweating or having a headache.  Still with dizziness, lethargic not feeling well. Disposition: '[]'$ ED /'[x]'$ Urgent Care (no appt availability in office) / '[]'$ Appointment(In office/virtual)/ '[]'$  [place] [facility]/ '[]'$ Home Care/ '[]'$ Refused Recommended Disposition /'[]'$ [place] [facility]/ '[]'$  Follow-up with PCP Additional Notes: She is coming over to [facility] not since there are no appts. With Primary Care at [facility] and she's been to the ED. I went over the s/s to go to the ED:   chest pain, shortness of breath, lightheadedness, dizziness becomes worse, breaking out in a sweat, bad headache, or BP is more elevated.   I made her an appt. For 02/21/2023 at 2:40 with Durene Fruits, NP as a follow up.

## 2023-02-16 ENCOUNTER — Other Ambulatory Visit: Payer: Self-pay

## 2023-02-16 ENCOUNTER — Emergency Department (HOSPITAL_BASED_OUTPATIENT_CLINIC_OR_DEPARTMENT_OTHER)
Admission: EM | Admit: 2023-02-16 | Discharge: 2023-02-16 | Disposition: A | Discharge status: Home / Self Care | Payer: Medicaid Other | Attending: Emergency Medicine | Admitting: Emergency Medicine

## 2023-02-16 DIAGNOSIS — I10 Essential (primary) hypertension: Secondary | ICD-10-CM

## 2023-02-16 LAB — BASIC METABOLIC PANEL
Anion gap: 8 (ref 5–15)
BUN: 13 mg/dL (ref 6–20)
CO2: 26 mmol/L (ref 22–32)
Calcium: 9.9 mg/dL (ref 8.9–10.3)
Chloride: 107 mmol/L (ref 98–111)
Creatinine, Ser: 0.68 mg/dL (ref 0.44–1.00)
GFR, Estimated: >60 mL/min (ref >60)
Glucose, Bld: 92 mg/dL (ref 70–99)
Potassium: 4.1 mmol/L (ref 3.5–5.1)
Sodium: 141 mmol/L (ref 135–145)

## 2023-02-16 LAB — PREGNANCY, URINE: Preg Test, Ur: NEGATIVE

## 2023-02-16 MED ORDER — LISINOPRIL 10 MG PO TABS
5.0000 mg | ORAL_TABLET | Freq: Every day | ORAL | 0 refills | Status: DC
  Filled 2023-02-16: qty 15, 30d supply, fill #0

## 2023-02-16 MED ORDER — LISINOPRIL 10 MG PO TABS
5.0000 mg | ORAL_TABLET | Freq: Once | ORAL | Status: DC
  Filled 2023-02-16: qty 1

## 2023-02-16 NOTE — ED Notes (Signed)
RN reviewed discharge instructions with pt. Pt verbalized understanding and had no further questions. VSS upon discharge.  

## 2023-02-16 NOTE — ED Provider Notes (Signed)
[place]  Provider Note  CSN: [id number] Arrival date & time: 02/15/23 2235  History Chief Complaint  Patient presents with   Hypertension    Melanie Vincent is a 47 y.o. female with history of PAF with home diltiazem and flecainide PRN but no prior history of HTN reports her BP has been elevated off and on the last few days. She was seen at [facility] for same with chest discomfort on 2/28 with negative workup, eventually discharged. Today she has had continued elevated readings on her home BP cuff with a headache and some vomiting. No history of frequent headaches. No CP today.    Home Medications Prior to Admission medications   Medication Sig Start Date End Date Taking? Authorizing Provider  lisinopril (ZESTRIL) 10 MG tablet Take 0.5 tablets (5 mg total) by mouth daily. 02/16/23  Yes Truddie Hidden, MD  cyclobenzaprine (FLEXERIL) 10 MG tablet Take 1 tablet (10 mg total) by mouth 3 (three) times daily as needed for muscle spasms. 05/25/21   Camillia Herter, NP  diltiazem (CARDIZEM) 30 MG tablet Take 1 tablet every 4 hours AS NEEDED for heart rate >100 01/03/23   Mickie Hillier, PA-C  flecainide (TAMBOCOR) 150 MG tablet Take 1-2 tablets by mouth once for afib can only use every 4 days 01/03/23   Mickie Hillier, PA-C  gabapentin (NEURONTIN) 100 MG capsule Take 3 capsules (300 mg total) by mouth at bedtime as needed. 08/21/22   Marcial Pacas, MD  gabapentin (NEURONTIN) 300 MG capsule Take 1 capsule (300 mg total) by mouth at bedtime. 05/17/22 06/16/22  Camillia Herter, NP  ondansetron (ZOFRAN) 4 MG tablet Take 1 tablet (4 mg total) by mouth every 6 (six) hours. 01/03/23   Mickie Hillier, PA-C  pantoprazole (PROTONIX) 40 MG tablet Take 1 tablet (40 mg total) by mouth daily. 06/16/22   Maryjane Hurter, MD  sucralfate (CARAFATE) 1 GM/10ML suspension Take 10 mLs (1 g total) by mouth 4 (four) times daily -  with meals and at bedtime. 05/24/22   Camillia Herter,  NP  valACYclovir (VALTREX) 1000 MG tablet Take 1 tablet (1,000 mg total) by mouth 2 (two) times daily as needed (for cold sores). 05/25/21   Camillia Herter, NP     Allergies    Zofran Alvis Lemmings hcl]   Review of Systems   Review of Systems Please see HPI for pertinent positives and negatives  Physical Exam BP (!) 140/95   Pulse 63   Temp 98.3 F (36.8 C) (Oral)   Resp 19   Wt 90.7 kg   LMP 02/07/2023   SpO2 100%   BMI 32.28 kg/m   Physical Exam Vitals and nursing note reviewed.  Constitutional:      Appearance: Normal appearance.  HENT:     Head: Normocephalic and atraumatic.     Nose: Nose normal.     Mouth/Throat:     Mouth: Mucous membranes are moist.  Eyes:     Extraocular Movements: Extraocular movements intact.     Conjunctiva/sclera: Conjunctivae normal.  Cardiovascular:     Rate and Rhythm: Normal rate.  Pulmonary:     Effort: Pulmonary effort is normal.     Breath sounds: Normal breath sounds.  Abdominal:     General: Abdomen is flat.     Palpations: Abdomen is soft.     Tenderness: There is no abdominal tenderness.  Musculoskeletal:  General: No swelling. Normal range of motion.     Cervical back: Neck supple.  Skin:    General: Skin is warm and dry.  Neurological:     General: No focal deficit present.     Mental Status: She is alert.  Psychiatric:        Mood and Affect: Mood normal.     ED Results / Procedures / Treatments   EKG None  Procedures Procedures  Medications Ordered in the ED Medications  lisinopril (ZESTRIL) tablet 5 mg (has no administration in time range)    Initial Impression and Plan  Patient here with elevated BP readings at home. Multiple vague symptoms but no signs of end organ damage on labs done in triage including normal CBC, BMP and HCG. She does not have any focal neuro deficits to suggest ICH and I personally viewed the images from radiology studies and agree with radiologist interpretation: CT is  neg for acute process. BP is not particularly elevated here but reportedly as high as 175/110 at home. Discussed initiation of low dose lisinopril over the next few days with close PCP follow up for recheck of BP. Patient is amenable to that plan. RTED for any other concerns in the meantime  ED Course       MDM Rules/Calculators/A&P Medical Decision Making Given presenting complaint, I considered that admission might be necessary. After review of results from ED lab and/or imaging studies, admission to the hospital is not indicated at this time.    Problems Addressed: Uncontrolled hypertension: acute illness or injury  Amount and/or Complexity of Data Reviewed Labs: ordered. Decision-making details documented in ED Course. Radiology: ordered and independent interpretation performed. Decision-making details documented in ED Course.  Risk Prescription drug management. Decision regarding hospitalization.     Final Clinical Impression(s) / ED Diagnoses Final diagnoses:  Uncontrolled hypertension    Rx / DC Orders ED Discharge Orders          Ordered    lisinopril (ZESTRIL) 10 MG tablet  Daily        02/16/23 0146             Truddie Hidden, MD 02/16/23 [phone]

## 2023-02-20 NOTE — Progress Notes (Unsigned)
Patient ID: Melanie Vincent, female    DOB: [birth year]  MRN: [id number]  CC: Emergency Department Follow-Up  Subjective: Percilla Vincent is a 47 y.o. female who presents for emergency department follow-up.  Her concerns today include:  02/16/2023 [facility] Emergency Department at [facility] per MD note: Medical Decision Making Given presenting complaint, I considered that admission might be necessary. After review of results from ED lab and/or imaging studies, admission to the hospital is not indicated at this time.      Problems Addressed: Uncontrolled hypertension: acute illness or injury   Amount and/or Complexity of Data Reviewed Labs: ordered. Decision-making details documented in ED Course. Radiology: ordered and independent interpretation performed. Decision-making details documented in ED Course.   Risk Prescription drug management. Decision regarding hospitalization.  Today's visit 02/21/2023: Lisinopril   No show Cards 01/25/2023 for appt a fib   Patient Active Problem List   Diagnosis Date Noted   Paresthesia 08/21/2022   Bacterial vaginitis 05/26/2021   Lumbar degenerative disc disease 05/25/2021   Educated about COVID-19 virus infection 06/13/2020   Precordial chest pain 06/13/2020   Sleep apnea suspected 10/06/2015   Anxiety reaction 09/15/2015   PAF (paroxysmal atrial fibrillation) (HCC) 11/25/2014   Herpes labialis 07/29/2013   Well woman exam (no gynecological exam) 05/02/2013   GERD (gastroesophageal reflux disease) 11/13/2012   Overweight 06/14/2012     Current Outpatient Medications on File Prior to Visit  Medication Sig Dispense Refill   cyclobenzaprine (FLEXERIL) 10 MG tablet Take 1 tablet (10 mg total) by mouth 3 (three) times daily as needed for muscle spasms. 90 tablet 1   diltiazem (CARDIZEM) 30 MG tablet Take 1 tablet every 4 hours AS NEEDED for heart rate >100 30 tablet 3   flecainide (TAMBOCOR) 150 MG tablet Take 1-2 tablets by  mouth once for afib can only use every 4 days 6 tablet 1   gabapentin (NEURONTIN) 100 MG capsule Take 3 capsules (300 mg total) by mouth at bedtime as needed. 90 capsule 6   gabapentin (NEURONTIN) 300 MG capsule Take 1 capsule (300 mg total) by mouth at bedtime. 30 capsule 0   lisinopril (ZESTRIL) 10 MG tablet Take 0.5 tablets (5 mg total) by mouth daily. 15 tablet 0   ondansetron (ZOFRAN) 4 MG tablet Take 1 tablet (4 mg total) by mouth every 6 (six) hours. 12 tablet 0   pantoprazole (PROTONIX) 40 MG tablet Take 1 tablet (40 mg total) by mouth daily. 30 tablet 5   sucralfate (CARAFATE) 1 GM/10ML suspension Take 10 mLs (1 g total) by mouth 4 (four) times daily -  with meals and at bedtime. 420 mL 3   valACYclovir (VALTREX) 1000 MG tablet Take 1 tablet (1,000 mg total) by mouth 2 (two) times daily as needed (for cold sores). 30 tablet 1   No current facility-administered medications on file prior to visit.    Allergies  Allergen Reactions   Zofran [Ondansetron Hcl] Itching    Social History   Socioeconomic History   Marital status: Single    Spouse name: Not on file   Number of children: 4   Years of education: Not on file   Highest education level: Not on file  Occupational History   Occupation: Caregiver  Tobacco Use   Smoking status: Former    Packs/day: 0.25    Years: 5.00    Total pack years: 1.25    Types: Cigarettes    Quit date: 06/15/2007    Years  since quitting: 15.6    Passive exposure: Past   Smokeless tobacco: Never  Vaping Use   Vaping Use: Some days   Substances: CBD   Devices: smoked CBD vape occ  Substance and Sexual Activity   Alcohol use: Yes    Alcohol/week: 4.0 standard drinks of alcohol    Types: 2 Glasses of wine, 2 Standard drinks or equivalent per week    Comment: 3 drinks per week   Drug use: Yes    Types: Marijuana   Sexual activity: Yes    Birth control/protection: Surgical, Condom  Other Topics Concern   Not on file  Social History  Narrative   Lives in [place] with 3 children and has two others.     Single.   Works as a Clinical biochemist (travel)   Social Determinants of Corporate investment banker Strain: Not on file  Food Insecurity: Not on file  Transportation Needs: Not on file  Physical Activity: Not on file  Stress: Not on file  Social Connections: Not on file  Intimate Partner Violence: Not on file    Family History  Problem Relation Age of Onset   Diabetes Father    Hypertension Father    Stroke Father    Colon polyps Father    Breast cancer Half-Sister 3   Diabetes Paternal Grandmother    Heart disease Paternal Grandmother    Diabetes Paternal Grandfather    Heart disease Paternal Grandfather    Colon cancer Paternal Aunt        late 70's   Breast cancer Maternal Aunt    Esophageal cancer Neg Hx    Rectal cancer Neg Hx    Stomach cancer Neg Hx     Past Surgical History:  Procedure Laterality Date   CESAREAN SECTION     TUBAL LIGATION     2008    ROS: Review of Systems Negative except as stated above  PHYSICAL EXAM: LMP 02/07/2023   Physical Exam  {female adult master:310786} {female adult master:310785}     Latest Ref Rng & Units 02/15/2023   11:26 PM 02/07/2023    9:03 PM 01/03/2023    4:09 AM  CMP  Glucose 70 - 99 mg/dL 92  90  161   BUN 6 - 20 mg/dL 13  11  14    Creatinine 0.44 - 1.00 mg/dL 0.96  0.45  4.09   Sodium 135 - 145 mmol/L 141  136  137   Potassium 3.5 - 5.1 mmol/L 4.1  3.4  3.9   Chloride 98 - 111 mmol/L 107  104  107   CO2 22 - 32 mmol/L 26  22  19    Calcium 8.9 - 10.3 mg/dL 9.9  9.1  9.0   Total Protein 6.5 - 8.1 g/dL   7.1   Total Bilirubin 0.3 - 1.2 mg/dL   0.4   Alkaline Phos 38 - 126 U/L   47   AST 15 - 41 U/L   25   ALT 0 - 44 U/L   21    Lipid Panel     Component Value Date/Time   CHOL 214 (H) 05/25/2021 1006   TRIG 65 05/25/2021 1006   HDL 56 05/25/2021 1006   CHOLHDL 3.8 05/25/2021 1006   CHOLHDL 3.7 Ratio 06/01/2009 2050   VLDL  12 06/01/2009 2050   LDLCALC 146 (H) 05/25/2021 1006    CBC    Component Value Date/Time   WBC 8.8 02/15/2023 2326   RBC  4.45 02/15/2023 2326   HGB 13.0 02/15/2023 2326   HGB 12.4 08/24/2020 1202   HCT 39.6 02/15/2023 2326   HCT 38.8 08/24/2020 1202   PLT 405 (H) 02/15/2023 2326   PLT 403 08/24/2020 1202   MCV 89.0 02/15/2023 2326   MCV 92 08/24/2020 1202   MCH 29.2 02/15/2023 2326   MCHC 32.8 02/15/2023 2326   RDW 14.2 02/15/2023 2326   RDW 13.2 08/24/2020 1202   LYMPHSABS 2.6 02/15/2023 2326   LYMPHSABS 2.8 08/24/2020 1202   MONOABS 0.5 02/15/2023 2326   EOSABS 0.1 02/15/2023 2326   EOSABS 0.1 08/24/2020 1202   BASOSABS 0.0 02/15/2023 2326   BASOSABS 0.0 08/24/2020 1202    ASSESSMENT AND PLAN:  There are no diagnoses linked to this encounter.   Patient was given the opportunity to ask questions.  Patient verbalized understanding of the plan and was able to repeat key elements of the plan. Patient was given clear instructions to go to Emergency Department or return to medical center if symptoms don't improve, worsen, or new problems develop.The patient verbalized understanding.   No orders of the defined types were placed in this encounter.    Requested Prescriptions    No prescriptions requested or ordered in this encounter    No follow-ups on file.  Rema Fendt, NP

## 2023-02-21 ENCOUNTER — Encounter: Payer: Medicaid Other | Admitting: Family

## 2023-02-21 DIAGNOSIS — I10 Essential (primary) hypertension: Secondary | ICD-10-CM

## 2023-02-22 ENCOUNTER — Other Ambulatory Visit: Payer: Self-pay

## 2023-02-27 ENCOUNTER — Ambulatory Visit: Payer: Medicaid Other | Admitting: Neurology

## 2023-02-27 VITALS — BP 120/90 | HR 82 | Ht 67.0 in | Wt 203.0 lb

## 2023-02-27 DIAGNOSIS — R202 Paresthesia of skin: Secondary | ICD-10-CM | POA: Diagnosis not present

## 2023-02-27 DIAGNOSIS — G5603 Carpal tunnel syndrome, bilateral upper limbs: Secondary | ICD-10-CM

## 2023-02-28 ENCOUNTER — Encounter: Payer: Medicaid Other | Admitting: Neurology

## 2023-02-28 DIAGNOSIS — G5603 Carpal tunnel syndrome, bilateral upper limbs: Secondary | ICD-10-CM | POA: Insufficient documentation

## 2023-02-28 NOTE — Procedures (Signed)
Full Name: Melanie Vincent Gender: Female MRN #: [id number] Date of Birth: March 24, 1976    Visit Date: 02/27/2023 09:01 Age: 47 Years Examining Physician: Dr. Marcial Pacas Referring Physician: Dr. Marcial Pacas Height: 5 feet 7 inch History: 47 year old female with intermittent bilateral hands paresthesia  Summary of the test: Nerve conduction study: Right sural, superficial peroneal sensory responses are normal.  Right tibial, peroneal to EDB motor responses were normal  Bilateral median sensory response showed moderately prolonged peak latency, with moderate to severely decreased snap amplitude.  Bilateral median motor responses showed moderately prolonged distal latency, right worse than left, with normal CMAP amplitude, conduction velocity.  Bilateral ulnar sensory and motor responses were within normal limit.  Electromyography: Selected needle examination of right upper extremity muscles, right cervical paraspinal and the left abductor pollicis brevis were performed  The only abnormality is mildly enlarged motor unit potential was mildly decreased recruitment at bilateral abductor pollicis brevis.  Conclusion: This is an abnormal study.  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    ------------------------------- Marcial Pacas, M.D. PhD  [facility] [address], [place], [place] [address] Tel: [phone] Fax: [phone]  Verbal informed consent was obtained from the patient, patient was informed of potential risk of procedure, including bruising, bleeding, hematoma formation, infection, muscle weakness, muscle pain, numbness, among others.        Watson    Nerve / Sites Muscle Latency Ref. Amplitude Ref. Rel Amp Segments Distance Velocity Ref. Area    ms ms mV  mV %  cm m/s m/s mVms  R Median - APB     Wrist APB 5.4 ?4.4 8.7 ?4.0 100 Wrist - APB 7   30.6     Upper arm APB 10.2  8.4  96.4 Upper arm - Wrist 24 50 ?49 29.3  L Median - APB     Wrist APB 4.3 ?4.4 9.9 ?4.0 100 Wrist - APB 7   36.7     Upper arm APB 8.7  9.6  96.5 Upper arm - Wrist 24 54 ?49 33.9  R Ulnar - ADM     Wrist ADM 2.9 ?3.3 9.6 ?6.0 100 Wrist - ADM 7   31.8     B.Elbow ADM 5.0  9.3  96 B.Elbow - Wrist 15 69 ?49 30.7     A.Elbow ADM 8.0  10.1  109 A.Elbow - B.Elbow 17 58 ?49 33.0  L Ulnar - ADM     Wrist ADM 2.6 ?3.3 7.4 ?6.0 100 Wrist - ADM 7   31.2     B.Elbow ADM 4.8  7.3  99 B.Elbow - Wrist 14 63 ?49 29.1     A.Elbow ADM 7.8  7.4  101 A.Elbow - B.Elbow 17.6 59 ?49 30.9  R Peroneal - EDB     Ankle EDB 4.2 ?6.5 5.5 ?2.0 100 Ankle - EDB 9   15.8     Fib head EDB 9.8  6.2  113 Fib head - Ankle 26 46 ?44 18.7     Pop fossa EDB 12.1  5.8  93.6 Pop fossa - Fib head 10 45 ?44 18.1         Pop fossa - Ankle      R Tibial - AH     Ankle AH 5.3 ?5.8  4.2 ?4.0 100 Ankle - AH 9   7.6     Pop fossa AH 11.8  5.1  123 Pop fossa - Ankle 42 64 ?41 13.6                 SNC    Nerve / Sites Rec. Site Peak Lat Ref.  Amp Ref. Segments Distance    ms ms V V  cm  R Sural - Ankle (Calf)     Calf Ankle 4.0 ?4.4 18 ?6 Calf - Ankle 14  R Superficial peroneal - Ankle     Lat leg Ankle 3.5 ?4.4 6 ?6 Lat leg - Ankle 14  R Median - Orthodromic (Dig II, Mid palm)     Dig II Wrist 4.8 ?3.4 3 ?10 Dig II - Wrist 13  L Median - Orthodromic (Dig II, Mid palm)     Dig II Wrist 4.3 ?3.4 7 ?10 Dig II - Wrist 13  R Ulnar - Orthodromic, (Dig V, Mid palm)     Dig V Wrist 3.1 ?3.1 10 ?5 Dig V - Wrist 11  L Ulnar - Orthodromic, (Dig V, Mid palm)     Dig V Wrist 3.1 ?3.1 9 ?5 Dig V - Wrist 34                 F  Wave    Nerve F Lat Ref.   ms ms  R Ulnar - ADM 27.9 ?32.0  L Ulnar - ADM 27.7 ?32.0  R Peroneal - EDB 40.0 ?56.0  R Tibial - AH 51.9 ?56.0             EMG Summary Table    Spontaneous  MUAP Recruitment  Muscle IA Fib PSW Fasc Other Amp Dur. Poly Pattern  R. First dorsal interosseous Normal None None None _______ Normal Normal Normal Normal  R. Abductor pollicis brevis Normal None None None _______ Normal Normal Normal Reduced  R. Pronator teres Normal None None None _______ Normal Normal Normal Normal  R. Biceps brachii Normal None None None _______ Normal Normal Normal Normal  R. Deltoid Normal None None None _______ Normal Normal Normal Normal  R. Triceps brachii Normal None None None _______ Normal Normal Normal Normal  R. Cervical paraspinals Normal None None None _______ Normal Normal Normal Normal  L. Abductor pollicis brevis Normal None None None _______ Normal Normal Normal Reduced

## 2023-02-28 NOTE — Progress Notes (Signed)
Moderate bilateral carpal tunnel syndromes, demyelinating in nature, right worse than left, suggest bilateral wrist splint, as needed NSAIDs, gabapentin as needed for sleep

## 2023-03-14 ENCOUNTER — Ambulatory Visit
Admission: EM | Admit: 2023-03-14 | Discharge: 2023-03-14 | Disposition: A | Discharge status: Home / Self Care | Payer: Medicaid Other | Attending: Emergency Medicine | Admitting: Emergency Medicine

## 2023-03-14 DIAGNOSIS — S90522A Blister (nonthermal), left ankle, initial encounter: Secondary | ICD-10-CM | POA: Diagnosis not present

## 2023-03-14 MED ORDER — DOXYCYCLINE HYCLATE 100 MG PO CAPS: 100.0000 mg | ORAL_CAPSULE | Freq: Two times a day (BID) | ORAL | 0 refills | Status: DC

## 2023-03-14 NOTE — Discharge Instructions (Addendum)
Blister has been drained in office, should reduce pain and swelling causes pressure on the areas around it   begin doxycycline every morning every evening for 7 days you should see improvement in about 48 hours and steady progression from there  May use ice or heat over the affected area in 10 to 15-minute intervals  May use Tylenol or ibuprofen every 6 hours as needed for management of pain  May elevate when sitting and lying to help reduce swelling and for comfort  May follow-up with your primary doctor if symptoms continue to persist or worsen

## 2023-03-14 NOTE — ED Provider Notes (Addendum)
[facility]    CSN: [id number] Arrival date & time: 03/14/23  1428      History   Chief Complaint Chief Complaint  Patient presents with   Tick Bite    HPI Melanie Vincent is a 47 y.o. female.   Presents for evaluation of left ankle pain and swelling beginning 5 days ago.  Endorses that she had a tick bite over the area 5 days ago, removed tenderness entirety noticed  blister over the affected area the next day.  Blister has increased in size, has been itching and painful.  Denies drainage or fevers.  Has attempted use of cortisone and Neosporin which has been ineffective.  ,   Past Medical History:  Diagnosis Date   Anxiety    Breast lump    Mammogram (last 2013)   DDD (degenerative disc disease), lumbar    GERD (gastroesophageal reflux disease)    Herpes simplex labialis    PAF (paroxysmal atrial fibrillation)     Patient Active Problem List   Diagnosis Date Noted   Bilateral carpal tunnel syndrome 02/28/2023   Paresthesia 08/21/2022   Bacterial vaginitis 05/26/2021   Lumbar degenerative disc disease 05/25/2021   Educated about COVID-19 virus infection 06/13/2020   Precordial chest pain 06/13/2020   Sleep apnea suspected 10/06/2015   Anxiety reaction 09/15/2015   PAF (paroxysmal atrial fibrillation) 11/25/2014   Herpes labialis 07/29/2013   Well woman exam (no gynecological exam) 05/02/2013   GERD (gastroesophageal reflux disease) 11/13/2012   Overweight 06/14/2012    Past Surgical History:  Procedure Laterality Date   CESAREAN SECTION     TUBAL LIGATION     2008    OB History     Gravida  6   Para  4   Term  4   Preterm      AB  2   Living  5      SAB      IAB  2   Ectopic      Multiple  1   Live Births           Obstetric Comments  Twins c-section in 2008          Home Medications    Prior to Admission medications   Medication Sig Start Date End Date Taking? Authorizing Provider  cyclobenzaprine  (FLEXERIL) 10 MG tablet Take 1 tablet (10 mg total) by mouth 3 (three) times daily as needed for muscle spasms. 05/25/21   Camillia Herter, NP  diltiazem (CARDIZEM) 30 MG tablet Take 1 tablet every 4 hours AS NEEDED for heart rate >100 01/03/23   Mickie Hillier, PA-C  flecainide (TAMBOCOR) 150 MG tablet Take 1-2 tablets by mouth once for afib can only use every 4 days 01/03/23   Mickie Hillier, PA-C  gabapentin (NEURONTIN) 100 MG capsule Take 3 capsules (300 mg total) by mouth at bedtime as needed. 08/21/22   Marcial Pacas, MD  gabapentin (NEURONTIN) 300 MG capsule Take 1 capsule (300 mg total) by mouth at bedtime. 05/17/22 06/16/22  Camillia Herter, NP  lisinopril (ZESTRIL) 10 MG tablet Take 0.5 tablets (5 mg total) by mouth daily. 02/16/23   Truddie Hidden, MD  ondansetron (ZOFRAN) 4 MG tablet Take 1 tablet (4 mg total) by mouth every 6 (six) hours. 01/03/23   Mickie Hillier, PA-C  pantoprazole (PROTONIX) 40 MG tablet Take 1 tablet (40 mg total) by mouth daily. 06/16/22   Maryjane Hurter, MD  sucralfate (  CARAFATE) 1 GM/10ML suspension Take 10 mLs (1 g total) by mouth 4 (four) times daily -  with meals and at bedtime. 05/24/22   Camillia Herter, NP  valACYclovir (VALTREX) 1000 MG tablet Take 1 tablet (1,000 mg total) by mouth 2 (two) times daily as needed (for cold sores). 05/25/21   Camillia Herter, NP    Family History Family History  Problem Relation Age of Onset   Diabetes Father    Hypertension Father    Stroke Father    Colon polyps Father    Breast cancer Half-Sister 21   Diabetes Paternal Grandmother    Heart disease Paternal Grandmother    Diabetes Paternal Grandfather    Heart disease Paternal Grandfather    Colon cancer Paternal Aunt        late 70's   Breast cancer Maternal Aunt    Esophageal cancer Neg Hx    Rectal cancer Neg Hx    Stomach cancer Neg Hx     Social History Social History   Tobacco Use   Smoking status: Former    Packs/day: 0.25    Years: 5.00    Additional  pack years: 0.00    Total pack years: 1.25    Types: Cigarettes    Quit date: 06/15/2007    Years since quitting: 15.7    Passive exposure: Past   Smokeless tobacco: Never  Vaping Use   Vaping Use: Some days   Substances: CBD   Devices: smoked CBD vape occ  Substance Use Topics   Alcohol use: Yes    Alcohol/week: 4.0 standard drinks of alcohol    Types: 2 Glasses of wine, 2 Standard drinks or equivalent per week    Comment: 3 drinks per week   Drug use: Yes    Types: Marijuana     Allergies   Zofran [ondansetron hcl]   Review of Systems Review of Systems   Physical Exam Triage Vital Signs ED Triage Vitals  Enc Vitals Group     BP 03/14/23 1448 114/73     Pulse Rate 03/14/23 1448 95     Resp 03/14/23 1448 17     Temp 03/14/23 1448 98.3 F (36.8 C)     Temp Source 03/14/23 1448 Oral     SpO2 03/14/23 1448 96 %     Weight --      Height --      Head Circumference --      Peak Flow --      Pain Score 03/14/23 1447 0     Pain Loc --      Pain Edu? --      Excl. in [place]? --    No data found.  Updated Vital Signs BP 114/73 (BP Location: Left Arm)   Pulse 95   Temp 98.3 F (36.8 C) (Oral)   Resp 17   LMP  (LMP Unknown)   SpO2 96%   Visual Acuity Right Eye Distance:   Left Eye Distance:   Bilateral Distance:    Right Eye Near:   Left Eye Near:    Bilateral Near:     Physical Exam Constitutional:      Appearance: Normal appearance.  Eyes:     Extraocular Movements: Extraocular movements intact.  Pulmonary:     Effort: Pulmonary effort is normal.  Skin:    Comments: 1 x 2 cm blister present to the posterior of the left ankle, tender to palpation, nonerythematous  Neurological:     Mental  Status: She is alert and oriented to person, place, and time.      UC Treatments / Results  Labs (all labs ordered are listed, but only abnormal results are displayed) Labs Reviewed - No data to display  EKG   Radiology No results  found.  Procedures Procedures (including critical care time)  Medications Ordered in UC Medications - No data to display  Initial Impression / Assessment and Plan / UC Course  I have reviewed the triage vital signs and the nursing notes.  Pertinent labs & imaging results that were available during my care of the patient were reviewed by me and considered in my medical decision making (see chart for details).  Blister of left ankle, initial encounter  Site drained in office using lidocaine spray and 18-gauge needle, cleansed with alcohol and covered with a nonadherent dressing serous fluid expelled, tolerated well, prophylactically placed on doxycycline, no signs of Lyme disease rash, discussed with patient, may use supportive care of ice and heat and elevation, may follow-up with primary doctor if symptoms persist or worsen Final Clinical Impressions(s) / UC Diagnoses   Final diagnoses:  Cellulitis of left ankle     Discharge Instructions      Presentation is consistent with infection,   begin doxycycline every morning every evening for 7 days you should see improvement in about 48 hours and steady progression from there  May use ice or heat over the affected area in 10 to 15-minute intervals  May use Tylenol or ibuprofen every 6 hours as needed for management of pain  May elevate when sitting and lying to help reduce swelling and for comfort  May follow-up with your primary doctor if symptoms continue to persist or worsen     ED Prescriptions   None    PDMP not reviewed this encounter.   Hans Eden, NP 03/14/23 1536    Hans Eden, NP 03/14/23 1537

## 2023-03-14 NOTE — ED Triage Notes (Signed)
Pt presents with tick bite to left ankle X 5 days ago; pt states area is swollen with a blister and itchy.

## 2023-06-04 ENCOUNTER — Ambulatory Visit (INDEPENDENT_AMBULATORY_CARE_PROVIDER_SITE_OTHER): Payer: Medicaid Other

## 2023-06-04 DIAGNOSIS — Z111 Encounter for screening for respiratory tuberculosis: Secondary | ICD-10-CM | POA: Diagnosis not present

## 2023-06-04 NOTE — Progress Notes (Signed)
PPD Placement note Melanie Vincent, 47 y.o. female is here today for placement of PPD test Reason for PPD test: employment Pt taken PPD test before: yes Verified in allergy area and with patient that they are not allergic to the products PPD is made of (Phenol or Tween). No: Is patient taking any oral or IV steroid medication now or have they taken it in the last month? no Has the patient ever received the BCG vaccine?: no Has the patient been in recent contact with anyone known or suspected of having active TB disease?: no      O: Alert and oriented in NAD. P:  PPD placed on 06/04/2023.  Patient advised to return for reading within 48-72 hours.

## 2023-06-06 ENCOUNTER — Ambulatory Visit: Payer: Medicaid Other

## 2023-06-06 NOTE — Progress Notes (Signed)
Pt PPD test was read  Results negative

## 2023-07-05 ENCOUNTER — Emergency Department (HOSPITAL_BASED_OUTPATIENT_CLINIC_OR_DEPARTMENT_OTHER): Payer: Medicaid Other

## 2023-07-05 ENCOUNTER — Other Ambulatory Visit: Payer: Self-pay

## 2023-07-05 ENCOUNTER — Emergency Department (HOSPITAL_COMMUNITY)
Admission: EM | Admit: 2023-07-05 | Discharge: 2023-07-05 | Disposition: A | Discharge status: Home / Self Care | Payer: Medicaid Other | Attending: Emergency Medicine | Admitting: Emergency Medicine

## 2023-07-05 DIAGNOSIS — M79651 Pain in right thigh: Secondary | ICD-10-CM | POA: Diagnosis present

## 2023-07-05 DIAGNOSIS — R059 Cough, unspecified: Secondary | ICD-10-CM | POA: Diagnosis not present

## 2023-07-05 DIAGNOSIS — M79661 Pain in right lower leg: Secondary | ICD-10-CM

## 2023-07-05 DIAGNOSIS — R252 Cramp and spasm: Secondary | ICD-10-CM | POA: Insufficient documentation

## 2023-07-05 DIAGNOSIS — T148XXA Other injury of unspecified body region, initial encounter: Secondary | ICD-10-CM

## 2023-07-05 LAB — CBC WITH DIFFERENTIAL/PLATELET
Abs Immature Granulocytes: 0.02 10*3/uL (ref 0.00–0.07)
Basophils Absolute: 0 10*3/uL (ref 0.0–0.1)
Basophils Relative: 0 %
Eosinophils Absolute: 0.1 10*3/uL (ref 0.0–0.5)
Eosinophils Relative: 2 %
HCT: 35.9 % — ABNORMAL LOW (ref 36.0–46.0)
Hemoglobin: 11.6 g/dL — ABNORMAL LOW (ref 12.0–15.0)
Immature Granulocytes: 0 %
Lymphocytes Relative: 32 %
Lymphs Abs: 2 10*3/uL (ref 0.7–4.0)
MCH: 29.5 pg (ref 26.0–34.0)
MCHC: 32.3 g/dL (ref 30.0–36.0)
MCV: 91.3 fL (ref 80.0–100.0)
Monocytes Absolute: 0.5 10*3/uL (ref 0.1–1.0)
Monocytes Relative: 8 %
Neutro Abs: 3.6 10*3/uL (ref 1.7–7.7)
Neutrophils Relative %: 58 %
Platelets: 350 10*3/uL (ref 150–400)
RBC: 3.93 MIL/uL (ref 3.87–5.11)
RDW: 13.9 % (ref 11.5–15.5)
WBC: 6.2 10*3/uL (ref 4.0–10.5)
nRBC: 0 % (ref 0.0–0.2)

## 2023-07-05 LAB — COMPREHENSIVE METABOLIC PANEL
ALT: 25 U/L (ref 0–44)
AST: 22 U/L (ref 15–41)
Albumin: 3.7 g/dL (ref 3.5–5.0)
Alkaline Phosphatase: 52 U/L (ref 38–126)
Anion gap: 14 (ref 5–15)
BUN: 11 mg/dL (ref 6–20)
CO2: 21 mmol/L — ABNORMAL LOW (ref 22–32)
Calcium: 9.5 mg/dL (ref 8.9–10.3)
Chloride: 106 mmol/L (ref 98–111)
Creatinine, Ser: 0.65 mg/dL (ref 0.44–1.00)
GFR, Estimated: >60 mL/min (ref >60)
Glucose, Bld: 106 mg/dL — ABNORMAL HIGH (ref 70–99)
Potassium: 3.6 mmol/L (ref 3.5–5.1)
Sodium: 141 mmol/L (ref 135–145)
Total Bilirubin: 0.7 mg/dL (ref 0.3–1.2)
Total Protein: 6.6 g/dL (ref 6.5–8.1)

## 2023-07-05 LAB — D-DIMER, QUANTITATIVE: D-Dimer, Quant: 0.86 ug/mL-FEU — ABNORMAL HIGH (ref 0.00–0.50)

## 2023-07-05 NOTE — ED Notes (Signed)
Discharge instructions discussed with pt. Verbalized understanding. VSS. No questions or concerns regarding discharge  

## 2023-07-05 NOTE — ED Provider Notes (Signed)
[place] EMERGENCY DEPARTMENT AT [facility] Provider Note   CSN: [id number] Arrival date & time: 07/05/23  1154     History  Chief Complaint  Patient presents with   Leg Pain    Melanie Vincent is a 47 y.o. female who presents to ED concerned of right medial thigh pain.  Patient stating that 7 days ago the pain felt like mild muscle cramping which increased in severity 3 days ago.  Patient specifically endorses concern for DVT. Also stating that she has a chronic allergy cough and is not sure if it is worse from her baseline. Patient ambulatory in ED without problem or DOE.  Denies fevers, chest pain, dyspnea, nausea, vomiting   Leg Pain      Home Medications Prior to Admission medications   Medication Sig Start Date End Date Taking? Authorizing Provider  cyclobenzaprine (FLEXERIL) 10 MG tablet Take 1 tablet (10 mg total) by mouth 3 (three) times daily as needed for muscle spasms. 05/25/21   Rema Fendt, NP  diltiazem (CARDIZEM) 30 MG tablet Take 1 tablet every 4 hours AS NEEDED for heart rate >100 01/03/23   Cristopher Peru, PA-C  doxycycline (VIBRAMYCIN) 100 MG capsule Take 1 capsule (100 mg total) by mouth 2 (two) times daily. 03/14/23   Valinda Hoar, NP  flecainide (TAMBOCOR) 150 MG tablet Take 1-2 tablets by mouth once for afib can only use every 4 days 01/03/23   Cristopher Peru, PA-C  gabapentin (NEURONTIN) 100 MG capsule Take 3 capsules (300 mg total) by mouth at bedtime as needed. 08/21/22   Levert Feinstein, MD  gabapentin (NEURONTIN) 300 MG capsule Take 1 capsule (300 mg total) by mouth at bedtime. 05/17/22 06/16/22  Rema Fendt, NP  lisinopril (ZESTRIL) 10 MG tablet Take 0.5 tablets (5 mg total) by mouth daily. 02/16/23   Pollyann Savoy, MD  ondansetron (ZOFRAN) 4 MG tablet Take 1 tablet (4 mg total) by mouth every 6 (six) hours. 01/03/23   Cristopher Peru, PA-C  pantoprazole (PROTONIX) 40 MG tablet Take 1 tablet (40 mg total) by mouth daily. 06/16/22    Omar Person, MD  sucralfate (CARAFATE) 1 GM/10ML suspension Take 10 mLs (1 g total) by mouth 4 (four) times daily -  with meals and at bedtime. 05/24/22   Rema Fendt, NP  valACYclovir (VALTREX) 1000 MG tablet Take 1 tablet (1,000 mg total) by mouth 2 (two) times daily as needed (for cold sores). 05/25/21   Rema Fendt, NP      Allergies    Zofran Frazier Richards hcl]    Review of Systems   Review of Systems  Musculoskeletal:        Leg pain    Physical Exam Updated Vital Signs BP 137/83   Pulse 76   Temp 97.7 F (36.5 C) (Oral)   Resp 20   Ht 5\' 7"  (1.702 m)   Wt 90.7 kg   LMP 06/07/2023 (Approximate)   SpO2 100%   BMI 31.32 kg/m  Physical Exam Vitals and nursing note reviewed.  Constitutional:      General: She is not in acute distress.    Appearance: She is not ill-appearing or toxic-appearing.  HENT:     Head: Normocephalic and atraumatic.     Mouth/Throat:     Mouth: Mucous membranes are moist.  Eyes:     General: No scleral icterus.       Right eye: No discharge.  Left eye: No discharge.     Conjunctiva/sclera: Conjunctivae normal.  Cardiovascular:     Rate and Rhythm: Normal rate and regular rhythm.     Pulses: Normal pulses.     Heart sounds: Normal heart sounds. No murmur heard. Pulmonary:     Effort: Pulmonary effort is normal. No respiratory distress.     Breath sounds: Normal breath sounds. No wheezing, rhonchi or rales.  Abdominal:     General: Abdomen is flat.  Musculoskeletal:     Right lower leg: No edema.     Left lower leg: No edema.     Comments: +2 pedal pulses bilaterally. Tenderness to palpation of right medial thigh. No tenderness to palpation of bilateral calves. Sensation to light touch intact. Right leg non-tense. Patient ambulatory in ED to bathroom without complaint.  Skin:    General: Skin is warm and dry.     Findings: No rash.  Neurological:     General: No focal deficit present.     Mental Status: She is alert.  Mental status is at baseline.  Psychiatric:        Mood and Affect: Mood normal.        Behavior: Behavior normal.     ED Results / Procedures / Treatments   Labs (all labs ordered are listed, but only abnormal results are displayed) Labs Reviewed  D-DIMER, QUANTITATIVE - Abnormal; Notable for the following components:      Result Value   D-Dimer, Quant 0.86 (*)    All other components within normal limits  CBC WITH DIFFERENTIAL/PLATELET - Abnormal; Notable for the following components:   Hemoglobin 11.6 (*)    HCT 35.9 (*)    All other components within normal limits  COMPREHENSIVE METABOLIC PANEL - Abnormal; Notable for the following components:   CO2 21 (*)    Glucose, Bld 106 (*)    All other components within normal limits    EKG None  Radiology VAS [place] LOWER EXTREMITY VENOUS (DVT) (7a-7p)  Result Date: 07/05/2023  Lower Venous DVT Study Patient Name:  Melanie Vincent  Date of Exam:   07/05/2023 Medical Rec #: [id number]         Accession #:    [id number] Date of Birth: [birth year]        Patient Gender: F Patient Age:   35 years Exam Location:  [facility] Procedure:      VAS [place] LOWER EXTREMITY VENOUS (DVT) Referring Phys: [facility] Caelen Reierson --------------------------------------------------------------------------------  Indications: Pain.  Comparison Study: No previous exams Performing Technologist: Jody Hill RVT, RDMS  Examination Guidelines: A complete evaluation includes B-mode imaging, spectral Doppler, color Doppler, and power Doppler as needed of all accessible portions of each vessel. Bilateral testing is considered an integral part of a complete examination. Limited examinations for reoccurring indications may be performed as noted. The reflux portion of the exam is performed with the patient in reverse Trendelenburg.  +---------+---------------+---------+-----------+----------+--------------+ RIGHT    CompressibilityPhasicitySpontaneityPropertiesThrombus  Aging +---------+---------------+---------+-----------+----------+--------------+ CFV      Full           Yes      Yes                                 +---------+---------------+---------+-----------+----------+--------------+ SFJ      Full                                                        +---------+---------------+---------+-----------+----------+--------------+  FV Prox  Full           Yes      Yes                                 +---------+---------------+---------+-----------+----------+--------------+ FV Mid   Full           Yes      Yes                                 +---------+---------------+---------+-----------+----------+--------------+ FV DistalFull           Yes      Yes                                 +---------+---------------+---------+-----------+----------+--------------+ PFV      Full                                                        +---------+---------------+---------+-----------+----------+--------------+ POP      Full           Yes      Yes                                 +---------+---------------+---------+-----------+----------+--------------+ PTV      Full                                                        +---------+---------------+---------+-----------+----------+--------------+ PERO     Full                                                        +---------+---------------+---------+-----------+----------+--------------+   +----+---------------+---------+-----------+----------+--------------+ LEFTCompressibilityPhasicitySpontaneityPropertiesThrombus Aging +----+---------------+---------+-----------+----------+--------------+ CFV Full           Yes      Yes                                 +----+---------------+---------+-----------+----------+--------------+     Summary: RIGHT: - There is no evidence of deep vein thrombosis in the lower extremity.  - No cystic structure found in the popliteal  fossa. - Ultrasound characteristics of enlarged lymph nodes are noted in the groin.  LEFT: - No evidence of common femoral vein obstruction.  *See table(s) above for measurements and observations.    Preliminary     Procedures Procedures    Medications Ordered in ED Medications - No data to display  ED Course/ Medical Decision Making/ A&P                             Medical Decision Making Amount and/or Complexity of Data Reviewed Labs: ordered. Radiology: ordered.   This patient presents  to the ED for concern of right thigh pain, this involves an extensive number of treatment options, and is a complaint that carries with it a high risk of complications and morbidity.  The differential diagnosis includes DVT, PAD, dehydration, muscle strain   Co morbidities that complicate the patient evaluation  GERD, PAF   Lab Tests:  I Ordered, and personally interpreted labs.  The pertinent results include:   -d-dimer: elevated -CBC: mild anemia, no leukocytosis - CMP: no concern for electrolyte abnormality; no concern for kidney/liver damage    Imaging Studies ordered:  I ordered imaging studies including  - VAS [place]: To assess for DVT or other processes contributing to patient's symptoms. I independently visualized and interpreted imaging  I agree with the radiologist interpretation   Problem List / ED Course / Critical interventions / Medication management  Patient presents to emergency room concern for right medial thigh pain x 7 days.  Patient specifically endorsing concern for DVT.  When asking patient about respiratory complaints, patient stating that she is not sure if her baseline allergy cough has changed in severity. Physical exam unremarkable.  Patient ambulatory to the bathroom during ED visit without complaint.  Vitals without tachycardia, fever, or hypoxia.  CBC with mild anemia.  No leukocytosis.  CMP without electrolyte abnormalities.  D-dimer elevated so I proceeded  with an ultrasound of her legs looking for DVT. Ultrasound negative for DVT.  Patient initially requesting chest x-ray given that she does not know if her baseline allergy cough has changed in severity.  Once x-ray tech presented to patient, she was declining chest x-ray.  Patient denying respiratory complaint during my interview, but I offered to order her a CTA to assess for PE given that her main concern was DVT and her D-dimer was elevated.  Patient declined wanting chest x-ray or CT of the chest at this time.  Educated patient that if she is having symptoms such as shortness of breath, severe chest pain that she should immediately seek emergency care. I have reviewed the patients home medicines and have made adjustments as needed Patient afebrile with stable vitals.  Provided with return precautions.  Discharged in good condition.   Social Determinants of Health:  none           Final Clinical Impression(s) / ED Diagnoses Final diagnoses:  Muscle strain    Rx / DC Orders ED Discharge Orders     None         Dorthy Cooler, [place] 07/05/23 1742    Lonell Grandchild, MD 07/05/23 2350

## 2023-07-05 NOTE — Discharge Instructions (Signed)
It was a pleasure caring for you today.  Seek emergency care if experiencing any new or worsening symptoms such as shortness of breath, severe chest pain, loss of consciousness.

## 2023-07-05 NOTE — ED Triage Notes (Addendum)
Pt complains of right groin pain 7 days ago over the past few days the pain as radiated down right thigh stops at right knee. Pt denies injury. Feels like a deep cramp but concerned for DVT and would like to be evaluated. No swelling noted.

## 2023-07-11 ENCOUNTER — Ambulatory Visit: Payer: Medicaid Other

## 2023-07-11 ENCOUNTER — Ambulatory Visit
Admission: EM | Admit: 2023-07-11 | Discharge: 2023-07-11 | Disposition: A | Discharge status: Home / Self Care | Payer: Medicaid Other

## 2023-07-11 DIAGNOSIS — M79671 Pain in right foot: Secondary | ICD-10-CM

## 2023-07-11 NOTE — ED Provider Notes (Signed)
[facility]    CSN: [id number] Arrival date & time: 07/11/23  1525      History   Chief Complaint Chief Complaint  Patient presents with   Foot Pain    HPI Melanie Vincent is a 47 y.o. female.   Patient presents with right foot pain that started 3 weeks ago after an injury.  States that she dropped a large log on her foot.  This is the first time she has been evaluated for this injury.  Has taken ibuprofen intermittently for symptoms with some improvement.   Foot Pain    Past Medical History:  Diagnosis Date   Anxiety    Breast lump    Mammogram (last 2013)   DDD (degenerative disc disease), lumbar    GERD (gastroesophageal reflux disease)    Herpes simplex labialis    PAF (paroxysmal atrial fibrillation) (HCC)     Patient Active Problem List   Diagnosis Date Noted   Bilateral carpal tunnel syndrome 02/28/2023   Paresthesia 08/21/2022   Bacterial vaginitis 05/26/2021   Lumbar degenerative disc disease 05/25/2021   Educated about COVID-19 virus infection 06/13/2020   Precordial chest pain 06/13/2020   Sleep apnea suspected 10/06/2015   Anxiety reaction 09/15/2015   PAF (paroxysmal atrial fibrillation) (HCC) 11/25/2014   Herpes labialis 07/29/2013   Well woman exam (no gynecological exam) 05/02/2013   GERD (gastroesophageal reflux disease) 11/13/2012   Overweight 06/14/2012    Past Surgical History:  Procedure Laterality Date   CESAREAN SECTION     TUBAL LIGATION     2008    OB History     Gravida  6   Para  4   Term  4   Preterm      AB  2   Living  5      SAB      IAB  2   Ectopic      Multiple  1   Live Births           Obstetric Comments  Twins c-section in 2008          Home Medications    Prior to Admission medications   Medication Sig Start Date End Date Taking? Authorizing Provider  cyclobenzaprine (FLEXERIL) 10 MG tablet Take 1 tablet (10 mg total) by mouth 3 (three) times daily as needed for  muscle spasms. 05/25/21   Rema Fendt, NP  diltiazem (CARDIZEM) 30 MG tablet Take 1 tablet every 4 hours AS NEEDED for heart rate >100 01/03/23   Cristopher Peru, PA-C  doxycycline (VIBRAMYCIN) 100 MG capsule Take 1 capsule (100 mg total) by mouth 2 (two) times daily. 03/14/23   Valinda Hoar, NP  flecainide (TAMBOCOR) 150 MG tablet Take 1-2 tablets by mouth once for afib can only use every 4 days 01/03/23   Cristopher Peru, PA-C  gabapentin (NEURONTIN) 100 MG capsule Take 3 capsules (300 mg total) by mouth at bedtime as needed. 08/21/22   Levert Feinstein, MD  gabapentin (NEURONTIN) 300 MG capsule Take 1 capsule (300 mg total) by mouth at bedtime. 05/17/22 06/16/22  Rema Fendt, NP  lisinopril (ZESTRIL) 10 MG tablet Take 0.5 tablets (5 mg total) by mouth daily. 02/16/23   Pollyann Savoy, MD  ondansetron (ZOFRAN) 4 MG tablet Take 1 tablet (4 mg total) by mouth every 6 (six) hours. 01/03/23   Cristopher Peru, PA-C  pantoprazole (PROTONIX) 40 MG tablet Take 1 tablet (40 mg total) by mouth  daily. 06/16/22   Omar Person, MD  sucralfate (CARAFATE) 1 GM/10ML suspension Take 10 mLs (1 g total) by mouth 4 (four) times daily -  with meals and at bedtime. 05/24/22   Rema Fendt, NP  valACYclovir (VALTREX) 1000 MG tablet Take 1 tablet (1,000 mg total) by mouth 2 (two) times daily as needed (for cold sores). 05/25/21   Rema Fendt, NP    Family History Family History  Problem Relation Age of Onset   Diabetes Father    Hypertension Father    Stroke Father    Colon polyps Father    Breast cancer Half-Sister 71   Diabetes Paternal Grandmother    Heart disease Paternal Grandmother    Diabetes Paternal Grandfather    Heart disease Paternal Grandfather    Colon cancer Paternal Aunt        late 70's   Breast cancer Maternal Aunt    Esophageal cancer Neg Hx    Rectal cancer Neg Hx    Stomach cancer Neg Hx     Social History Social History   Tobacco Use   Smoking status: Former    Current  packs/day: 0.00    Average packs/day: 0.3 packs/day for 5.0 years (1.3 ttl pk-yrs)    Types: Cigarettes    Start date: 06/14/2002    Quit date: 06/15/2007    Years since quitting: 16.0    Passive exposure: Past   Smokeless tobacco: Never  Vaping Use   Vaping status: Some Days   Substances: CBD   Devices: smoked CBD vape occ  Substance Use Topics   Alcohol use: Yes    Alcohol/week: 4.0 standard drinks of alcohol    Types: 2 Glasses of wine, 2 Standard drinks or equivalent per week    Comment: 3 drinks per week   Drug use: Yes    Types: Marijuana     Allergies   Zofran [ondansetron hcl]   Review of Systems Review of Systems Per HPI  Physical Exam Triage Vital Signs ED Triage Vitals  Encounter Vitals Group     BP 07/11/23 1536 101/67     Systolic BP Percentile --      Diastolic BP Percentile --      Pulse Rate 07/11/23 1536 87     Resp 07/11/23 1536 17     Temp 07/11/23 1536 98.4 F (36.9 C)     Temp Source 07/11/23 1536 Oral     SpO2 07/11/23 1536 98 %     Weight --      Height --      Head Circumference --      Peak Flow --      Pain Score 07/11/23 1535 6     Pain Loc --      Pain Education --      Exclude from Growth Chart --    No data found.  Updated Vital Signs BP 101/67 (BP Location: Left Arm)   Pulse 87   Temp 98.4 F (36.9 C) (Oral)   Resp 17   LMP 06/07/2023 (Approximate)   SpO2 98%   Visual Acuity Right Eye Distance:   Left Eye Distance:   Bilateral Distance:    Right Eye Near:   Left Eye Near:    Bilateral Near:     Physical Exam Constitutional:      General: She is not in acute distress.    Appearance: Normal appearance. She is not toxic-appearing or diaphoretic.  HENT:  Head: Normocephalic and atraumatic.  Eyes:     Extraocular Movements: Extraocular movements intact.     Conjunctiva/sclera: Conjunctivae normal.  Pulmonary:     Effort: Pulmonary effort is normal.  Feet:     Comments: Has tenderness to palpation to dorsal  surface of foot with majority of tenderness being at the proximal portion.  No abrasions or lacerations noted but there is some mild swelling with no discoloration.  Patient can wiggle toes.  No tenderness to toes or ankle.  Capillary refill and pulses intact. Neurological:     General: No focal deficit present.     Mental Status: She is alert and oriented to person, place, and time. Mental status is at baseline.  Psychiatric:        Mood and Affect: Mood normal.        Behavior: Behavior normal.        Thought Content: Thought content normal.        Judgment: Judgment normal.      UC Treatments / Results  Labs (all labs ordered are listed, but only abnormal results are displayed) Labs Reviewed - No data to display  EKG   Radiology No results found.  Procedures Procedures (including critical care time)  Medications Ordered in UC Medications - No data to display  Initial Impression / Assessment and Plan / UC Course  I have reviewed the triage vital signs and the nursing notes.  Pertinent labs & imaging results that were available during my care of the patient were reviewed by me and considered in my medical decision making (see chart for details).     Patient was originally agreeable to right foot x-ray given impact injury.  Patient came out of the room while waiting on x-ray to be completed stating that she just found out she needed to go pick up her son immediately.  Therefore, imaging was deferred as patient stated that she was not able to wait for this to be completed.  Patient states that she will come back tomorrow for further evaluation and possible imaging of the foot.  In the meantime, as patient was walking out recommended ice application and elevation of right foot.  Patient verbalized understanding of this. Final Clinical Impressions(s) / UC Diagnoses   Final diagnoses:  Right foot pain   Discharge Instructions   None    ED Prescriptions   None    PDMP  not reviewed this encounter.   Gustavus Bryant, [place] 07/11/23 [phone]

## 2023-07-11 NOTE — ED Triage Notes (Signed)
Pt present right foot pain,three weeks ago I dropped a log on her right foot. Pt right foot is swollen with limited range of motion.

## 2023-07-11 NOTE — ED Notes (Signed)
Pt states she has to leave before having her xray states her son needed her.  States she will come back tomorrow.

## 2023-07-13 ENCOUNTER — Emergency Department (HOSPITAL_BASED_OUTPATIENT_CLINIC_OR_DEPARTMENT_OTHER)
Admission: EM | Admit: 2023-07-13 | Discharge: 2023-07-13 | Disposition: A | Discharge status: Home / Self Care | Payer: Medicaid Other | Attending: Emergency Medicine | Admitting: Emergency Medicine

## 2023-07-13 ENCOUNTER — Encounter (HOSPITAL_BASED_OUTPATIENT_CLINIC_OR_DEPARTMENT_OTHER): Payer: Self-pay

## 2023-07-13 ENCOUNTER — Ambulatory Visit
Admission: EM | Admit: 2023-07-13 | Discharge: 2023-07-13 | Disposition: A | Discharge status: Home / Self Care | Payer: Medicaid Other | Attending: Physician Assistant | Admitting: Physician Assistant

## 2023-07-13 ENCOUNTER — Other Ambulatory Visit: Payer: Self-pay

## 2023-07-13 ENCOUNTER — Emergency Department (HOSPITAL_BASED_OUTPATIENT_CLINIC_OR_DEPARTMENT_OTHER): Payer: Medicaid Other

## 2023-07-13 DIAGNOSIS — W208XXA Other cause of strike by thrown, projected or falling object, initial encounter: Secondary | ICD-10-CM | POA: Diagnosis not present

## 2023-07-13 DIAGNOSIS — S9031XA Contusion of right foot, initial encounter: Secondary | ICD-10-CM | POA: Diagnosis not present

## 2023-07-13 DIAGNOSIS — S99921D Unspecified injury of right foot, subsequent encounter: Secondary | ICD-10-CM | POA: Diagnosis not present

## 2023-07-13 DIAGNOSIS — S99921A Unspecified injury of right foot, initial encounter: Secondary | ICD-10-CM | POA: Diagnosis present

## 2023-07-13 NOTE — ED Triage Notes (Signed)
Pt to ED from UC c/o right foot injury. Pt states dropped log on foot aprox 2 weeks ago. Pt ambulatory in triage , pt has ortho boot to right foot.

## 2023-07-13 NOTE — ED Provider Notes (Signed)
[facility]    CSN: [id number] Arrival date & time: 07/13/23  1443      History   Chief Complaint Chief Complaint  Patient presents with   Foot Pain    HPI Melanie Vincent is a 47 y.o. female.   She returns today for evaluation of right foot injury.  She states that several days ago she dropped a log on her right foot and was seen initially here but was unable to wait for x-ray.  She states that she continues to have pain and swelling.  She denies any numbness or tingling.  Weightbearing does worsen pain.  The history is provided by the patient.  Foot Pain    Past Medical History:  Diagnosis Date   Anxiety    Breast lump    Mammogram (last 2013)   DDD (degenerative disc disease), lumbar    GERD (gastroesophageal reflux disease)    Herpes simplex labialis    PAF (paroxysmal atrial fibrillation) (HCC)     Patient Active Problem List   Diagnosis Date Noted   Bilateral carpal tunnel syndrome 02/28/2023   Paresthesia 08/21/2022   Bacterial vaginitis 05/26/2021   Lumbar degenerative disc disease 05/25/2021   Educated about COVID-19 virus infection 06/13/2020   Precordial chest pain 06/13/2020   Sleep apnea suspected 10/06/2015   Anxiety reaction 09/15/2015   PAF (paroxysmal atrial fibrillation) (HCC) 11/25/2014   Herpes labialis 07/29/2013   Well woman exam (no gynecological exam) 05/02/2013   GERD (gastroesophageal reflux disease) 11/13/2012   Overweight 06/14/2012    Past Surgical History:  Procedure Laterality Date   CESAREAN SECTION     TUBAL LIGATION     2008    OB History     Gravida  6   Para  4   Term  4   Preterm      AB  2   Living  5      SAB      IAB  2   Ectopic      Multiple  1   Live Births           Obstetric Comments  Twins c-section in 2008          Home Medications    Prior to Admission medications   Medication Sig Start Date End Date Taking? Authorizing Provider  cyclobenzaprine (FLEXERIL)  10 MG tablet Take 1 tablet (10 mg total) by mouth 3 (three) times daily as needed for muscle spasms. 05/25/21   Rema Fendt, NP  diltiazem (CARDIZEM) 30 MG tablet Take 1 tablet every 4 hours AS NEEDED for heart rate >100 01/03/23   Cristopher Peru, PA-C  doxycycline (VIBRAMYCIN) 100 MG capsule Take 1 capsule (100 mg total) by mouth 2 (two) times daily. 03/14/23   Valinda Hoar, NP  flecainide (TAMBOCOR) 150 MG tablet Take 1-2 tablets by mouth once for afib can only use every 4 days 01/03/23   Cristopher Peru, PA-C  gabapentin (NEURONTIN) 100 MG capsule Take 3 capsules (300 mg total) by mouth at bedtime as needed. 08/21/22   Levert Feinstein, MD  gabapentin (NEURONTIN) 300 MG capsule Take 1 capsule (300 mg total) by mouth at bedtime. 05/17/22 06/16/22  Rema Fendt, NP  lisinopril (ZESTRIL) 10 MG tablet Take 0.5 tablets (5 mg total) by mouth daily. 02/16/23   Pollyann Savoy, MD  ondansetron (ZOFRAN) 4 MG tablet Take 1 tablet (4 mg total) by mouth every 6 (six) hours. 01/03/23  Cristopher Peru, PA-C  pantoprazole (PROTONIX) 40 MG tablet Take 1 tablet (40 mg total) by mouth daily. 06/16/22   Omar Person, MD  sucralfate (CARAFATE) 1 GM/10ML suspension Take 10 mLs (1 g total) by mouth 4 (four) times daily -  with meals and at bedtime. 05/24/22   Rema Fendt, NP  valACYclovir (VALTREX) 1000 MG tablet Take 1 tablet (1,000 mg total) by mouth 2 (two) times daily as needed (for cold sores). 05/25/21   Rema Fendt, NP    Family History Family History  Problem Relation Age of Onset   Diabetes Father    Hypertension Father    Stroke Father    Colon polyps Father    Breast cancer Half-Sister 55   Diabetes Paternal Grandmother    Heart disease Paternal Grandmother    Diabetes Paternal Grandfather    Heart disease Paternal Grandfather    Colon cancer Paternal Aunt        late 70's   Breast cancer Maternal Aunt    Esophageal cancer Neg Hx    Rectal cancer Neg Hx    Stomach cancer Neg Hx      Social History Social History   Tobacco Use   Smoking status: Former    Current packs/day: 0.00    Average packs/day: 0.3 packs/day for 5.0 years (1.3 ttl pk-yrs)    Types: Cigarettes    Start date: 06/14/2002    Quit date: 06/15/2007    Years since quitting: 16.0    Passive exposure: Past   Smokeless tobacco: Never  Vaping Use   Vaping status: Some Days   Substances: CBD   Devices: smoked CBD vape occ  Substance Use Topics   Alcohol use: Yes    Alcohol/week: 4.0 standard drinks of alcohol    Types: 2 Glasses of wine, 2 Standard drinks or equivalent per week    Comment: 3 drinks per week   Drug use: Yes    Types: Marijuana     Allergies   Zofran [ondansetron hcl]   Review of Systems Review of Systems  Constitutional:  Negative for chills and fever.  Eyes:  Negative for discharge and redness.  Gastrointestinal:  Negative for nausea and vomiting.  Musculoskeletal:  Positive for arthralgias and joint swelling.  Neurological:  Negative for numbness.     Physical Exam Triage Vital Signs ED Triage Vitals  Encounter Vitals Group     BP 07/13/23 1522 119/75     Systolic BP Percentile --      Diastolic BP Percentile --      Pulse Rate 07/13/23 1522 68     Resp 07/13/23 1522 16     Temp 07/13/23 1522 98 F (36.7 C)     Temp Source 07/13/23 1522 Oral     SpO2 07/13/23 1522 97 %     Weight --      Height --      Head Circumference --      Peak Flow --      Pain Score 07/13/23 1523 7     Pain Loc --      Pain Education --      Exclude from Growth Chart --    No data found.  Updated Vital Signs BP 119/75 (BP Location: Left Arm)   Pulse 68   Temp 98 F (36.7 C) (Oral)   Resp 16   LMP 06/07/2023 (Approximate)   SpO2 97%      Physical Exam Vitals and nursing note  reviewed.  Constitutional:      General: She is not in acute distress.    Appearance: Normal appearance. She is not ill-appearing.  HENT:     Head: Normocephalic and atraumatic.  Eyes:      Conjunctiva/sclera: Conjunctivae normal.  Cardiovascular:     Rate and Rhythm: Normal rate.  Pulmonary:     Effort: Pulmonary effort is normal.  Musculoskeletal:     Comments: Full range of motion of right ankle, decreased range of motion of right fourth and fifth toe due to pain in dorsal foot diffusely.  Tenderness to palpation noted to distal dorsal midfoot  Skin:    Capillary Refill: Normal cap refill to right toes Neurological:     Mental Status: She is alert.     Comments: Gross sensation intact to distal right toes  Psychiatric:        Mood and Affect: Mood normal.        Behavior: Behavior normal.        Thought Content: Thought content normal.      UC Treatments / Results  Labs (all labs ordered are listed, but only abnormal results are displayed) Labs Reviewed - No data to display  EKG   Radiology No results found.  Procedures Procedures (including critical care time)  Medications Ordered in UC Medications - No data to display  Initial Impression / Assessment and Plan / UC Course  I have reviewed the triage vital signs and the nursing notes.  Pertinent labs & imaging results that were available during my care of the patient were reviewed by me and considered in my medical decision making (see chart for details).    X-ray ordered for further evaluation and to rule out fracture.  Will await results further recommendation.  Did supply postop shoe for hopeful support and transport for x-ray.  Final Clinical Impressions(s) / UC Diagnoses   Final diagnoses:  Injury of right foot, subsequent encounter     Discharge Instructions       Please report to [facility] for Xray  [address] [place], [place]   ED Prescriptions   None    PDMP not reviewed this encounter.   Tomi Bamberger, PA-C 07/13/23 Windell Moment

## 2023-07-13 NOTE — Discharge Instructions (Signed)
Recommend ice, Tylenol, ibuprofen.  Wear walking boot for comfort.  Follow-up with Dr. Logan Bores.

## 2023-07-13 NOTE — ED Triage Notes (Addendum)
Pt states she dropped a log on her right foot about 3 weeks ago. Was here 2 days ago but had to leave before she had her xray. Also states she has a cold sore on her lip.

## 2023-07-13 NOTE — Discharge Instructions (Signed)
  Please report to [facility] for Xray  [address] [place], [place]

## 2023-07-13 NOTE — ED Notes (Signed)
XR and provider at bedside

## 2023-07-13 NOTE — ED Provider Notes (Signed)
[place] EMERGENCY DEPARTMENT AT [facility] Provider Note   CSN: [id number] Arrival date & time: 07/13/23  2042     History  Chief Complaint  Patient presents with   Foot Injury    right    Melanie Vincent is a 47 y.o. female.  Patient here with right foot pain after she dropped an object on her foot a few weeks ago.  Pain worse with ambulation.  Nothing makes it better.  She was put in a walking boot with urgent care and sent here for x-ray.  She denies any nausea vomiting diarrhea numbness or tingling.  No weakness or other trauma elsewhere.  No prior injury.  The history is provided by the patient.       Home Medications Prior to Admission medications   Medication Sig Start Date End Date Taking? Authorizing Provider  cyclobenzaprine (FLEXERIL) 10 MG tablet Take 1 tablet (10 mg total) by mouth 3 (three) times daily as needed for muscle spasms. 05/25/21   Rema Fendt, NP  diltiazem (CARDIZEM) 30 MG tablet Take 1 tablet every 4 hours AS NEEDED for heart rate >100 01/03/23   Cristopher Peru, PA-C  doxycycline (VIBRAMYCIN) 100 MG capsule Take 1 capsule (100 mg total) by mouth 2 (two) times daily. 03/14/23   Valinda Hoar, NP  flecainide (TAMBOCOR) 150 MG tablet Take 1-2 tablets by mouth once for afib can only use every 4 days 01/03/23   Cristopher Peru, PA-C  gabapentin (NEURONTIN) 100 MG capsule Take 3 capsules (300 mg total) by mouth at bedtime as needed. 08/21/22   Levert Feinstein, MD  gabapentin (NEURONTIN) 300 MG capsule Take 1 capsule (300 mg total) by mouth at bedtime. 05/17/22 06/16/22  Rema Fendt, NP  lisinopril (ZESTRIL) 10 MG tablet Take 0.5 tablets (5 mg total) by mouth daily. 02/16/23   Pollyann Savoy, MD  ondansetron (ZOFRAN) 4 MG tablet Take 1 tablet (4 mg total) by mouth every 6 (six) hours. 01/03/23   Cristopher Peru, PA-C  pantoprazole (PROTONIX) 40 MG tablet Take 1 tablet (40 mg total) by mouth daily. 06/16/22   Omar Person, MD  sucralfate  (CARAFATE) 1 GM/10ML suspension Take 10 mLs (1 g total) by mouth 4 (four) times daily -  with meals and at bedtime. 05/24/22   Rema Fendt, NP  valACYclovir (VALTREX) 1000 MG tablet Take 1 tablet (1,000 mg total) by mouth 2 (two) times daily as needed (for cold sores). 05/25/21   Rema Fendt, NP      Allergies    Zofran Frazier Richards hcl]    Review of Systems   Review of Systems  Physical Exam Updated Vital Signs BP 122/72 (BP Location: Right Arm)   Pulse 66   Temp 98.1 F (36.7 C)   Resp 20   Ht 5\' 7"  (1.702 m)   Wt 90.7 kg   LMP 06/07/2023 (Approximate)   SpO2 99%   BMI 31.32 kg/m  Physical Exam Vitals and nursing note reviewed.  Constitutional:      General: She is not in acute distress.    Appearance: She is well-developed.  HENT:     Head: Normocephalic and atraumatic.  Eyes:     Extraocular Movements: Extraocular movements intact.     Conjunctiva/sclera: Conjunctivae normal.     Pupils: Pupils are equal, round, and reactive to light.  Cardiovascular:     Rate and Rhythm: Normal rate and regular rhythm.     Pulses: Normal  pulses.     Heart sounds: No murmur heard. Pulmonary:     Effort: Pulmonary effort is normal. No respiratory distress.     Breath sounds: Normal breath sounds.  Abdominal:     Palpations: Abdomen is soft.     Tenderness: There is no abdominal tenderness.  Musculoskeletal:        General: Tenderness present. No swelling. Normal range of motion.     Cervical back: Neck supple.     Comments: Tenderness to the top of the right foot but no obvious deformity  Skin:    General: Skin is warm and dry.     Capillary Refill: Capillary refill takes less than 2 seconds.  Neurological:     General: No focal deficit present.     Mental Status: She is alert.     Sensory: No sensory deficit.     Motor: No weakness.  Psychiatric:        Mood and Affect: Mood normal.     ED Results / Procedures / Treatments   Labs (all labs ordered are listed,  but only abnormal results are displayed) Labs Reviewed - No data to display  EKG None  Radiology DG Foot Complete Right  Result Date: 07/13/2023 CLINICAL DATA:  [id number] Injury [id number] EXAM: RIGHT FOOT COMPLETE - 3+ VIEW COMPARISON:  None Available. FINDINGS: There is no evidence of fracture or dislocation. There is no evidence of arthropathy or other focal bone abnormality. Dorsal subcutaneus soft tissue edema. IMPRESSION: No acute displaced fracture or dislocation. Electronically Signed   By: Tish Frederickson M.D.   On: 07/13/2023 21:49    Procedures Procedures    Medications Ordered in ED Medications - No data to display  ED Course/ Medical Decision Making/ A&P                                 Medical Decision Making Amount and/or Complexity of Data Reviewed Radiology: ordered.   [name] is here with tenderness to the right foot.  Normal vitals.  No fever.  Pain after dropping object on it a few weeks ago.  X-ray showed no fracture or malalignment.  Overall suspect contusion or sprain.  Will keep her in the postop shoe that urgent care her place during.  She is neurovascular neuromuscular intact otherwise.  Have no concern for infectious process.  She is actually felt a lot better walking in the postop shoe.  Recommend Tylenol and ibuprofen.  Will have her follow-up with foot doctor.  Discharged in good condition.  This chart was dictated using voice recognition software.  Despite best efforts to proofread,  errors can occur which can change the documentation meaning.         Final Clinical Impression(s) / ED Diagnoses Final diagnoses:  Contusion of right foot, initial encounter    Rx / DC Orders ED Discharge Orders     None         Virgina Norfolk, DO 07/13/23 2156

## 2023-09-28 ENCOUNTER — Encounter: Payer: Medicaid Other | Admitting: Family

## 2023-09-28 ENCOUNTER — Telehealth: Payer: Self-pay | Admitting: Family

## 2023-09-28 NOTE — Progress Notes (Signed)
Erroneous encounter-disregard

## 2023-09-28 NOTE — Telephone Encounter (Signed)
Called patient to reschedule missed appointment , left voicemail to call office back

## 2023-11-20 ENCOUNTER — Ambulatory Visit
Admission: EM | Admit: 2023-11-20 | Discharge: 2023-11-20 | Disposition: A | Discharge status: Home / Self Care | Payer: Medicaid Other

## 2023-11-22 ENCOUNTER — Ambulatory Visit
Admission: EM | Admit: 2023-11-22 | Discharge: 2023-11-22 | Discharge status: Against Medical Advice | Payer: Medicaid Other

## 2023-11-22 NOTE — ED Triage Notes (Signed)
"  I have a cough that started a few nights ago". Prior to this "on the 24th or 25th with sinus drainage/pressure, then sore throat/hoarse voice, couldn't talk, now cough is thick/productive". "A few nights ago coughed all night, so concerning me because I am going on a flight". No fever.

## 2023-11-30 ENCOUNTER — Other Ambulatory Visit: Payer: Medicaid Other

## 2023-11-30 ENCOUNTER — Ambulatory Visit (INDEPENDENT_AMBULATORY_CARE_PROVIDER_SITE_OTHER): Payer: Medicaid Other | Admitting: Family

## 2023-11-30 VITALS — BP 118/78 | HR 70 | Temp 98.2°F | Ht 67.0 in | Wt 209.8 lb

## 2023-11-30 DIAGNOSIS — Z131 Encounter for screening for diabetes mellitus: Secondary | ICD-10-CM | POA: Diagnosis not present

## 2023-11-30 DIAGNOSIS — R6889 Other general symptoms and signs: Secondary | ICD-10-CM

## 2023-11-30 DIAGNOSIS — Z13220 Encounter for screening for lipoid disorders: Secondary | ICD-10-CM

## 2023-11-30 DIAGNOSIS — Z130 Encounter for screening for diseases of the blood and blood-forming organs and certain disorders involving the immune mechanism: Secondary | ICD-10-CM | POA: Diagnosis not present

## 2023-11-30 DIAGNOSIS — Z1329 Encounter for screening for other suspected endocrine disorder: Secondary | ICD-10-CM

## 2023-11-30 DIAGNOSIS — Z13228 Encounter for screening for other metabolic disorders: Secondary | ICD-10-CM

## 2023-11-30 DIAGNOSIS — K219 Gastro-esophageal reflux disease without esophagitis: Secondary | ICD-10-CM

## 2023-11-30 DIAGNOSIS — M51369 Other intervertebral disc degeneration, lumbar region without mention of lumbar back pain or lower extremity pain: Secondary | ICD-10-CM

## 2023-11-30 DIAGNOSIS — I890 Lymphedema, not elsewhere classified: Secondary | ICD-10-CM

## 2023-11-30 DIAGNOSIS — Z0000 Encounter for general adult medical examination without abnormal findings: Secondary | ICD-10-CM | POA: Diagnosis not present

## 2023-11-30 DIAGNOSIS — Z1231 Encounter for screening mammogram for malignant neoplasm of breast: Secondary | ICD-10-CM

## 2023-11-30 DIAGNOSIS — N951 Menopausal and female climacteric states: Secondary | ICD-10-CM

## 2023-11-30 DIAGNOSIS — B009 Herpesviral infection, unspecified: Secondary | ICD-10-CM

## 2023-11-30 MED ORDER — VALACYCLOVIR HCL 1 G PO TABS
1000.0000 mg | ORAL_TABLET | Freq: Two times a day (BID) | ORAL | 1 refills | Status: AC | PRN
Start: 2023-11-30 — End: ?

## 2023-11-30 MED ORDER — CYCLOBENZAPRINE HCL 10 MG PO TABS: 10.0000 mg | ORAL_TABLET | Freq: Three times a day (TID) | ORAL | 0 refills | Status: AC | PRN

## 2023-11-30 MED ORDER — PANTOPRAZOLE SODIUM 40 MG PO TBEC
40.0000 mg | DELAYED_RELEASE_TABLET | Freq: Every day | ORAL | 0 refills | Status: DC
Start: 2023-11-30 — End: 2024-02-25

## 2023-11-30 MED ORDER — GUAIFENESIN 200 MG PO TABS
200.0000 mg | ORAL_TABLET | ORAL | 1 refills | Status: AC | PRN
Start: 2023-11-30 — End: ?

## 2023-11-30 NOTE — Progress Notes (Signed)
Patient states multiple complaints.

## 2023-11-30 NOTE — Progress Notes (Signed)
Patient ID: Melanie Vincent, female    DOB: 12-22-1975  MRN: [id number]  CC: Annual Exam  Subjective: Melanie Vincent is a 47 y.o. female who presents for annual exam.   Her concerns today include:  - States she plans to return at later date for fasting labs.  - Requests hormones to be checked. Reports hot flashes and bladder sensitivity.  - Reports she was seen several months ago at Emergency Department for enlarged lymph node of groin. States no treatment was provided. Reports since then area of concern has remained the same and denies red flag symptoms.  - Doing well on Cyclobenzaprine, no issues/concerns.  - Doing well on Pantoprazole, no issues/concerns.  - Doing well on Valacyclovir, no issues/concerns.  - States recently she had a cold. Has improved since then but still has congestion of throat. Denies red flag symptoms. Declines respiratory panel/strep test.   Patient Active Problem List   Diagnosis Date Noted   Bilateral carpal tunnel syndrome 02/28/2023   Paresthesia 08/21/2022   Bacterial vaginitis 05/26/2021   Lumbar degenerative disc disease 05/25/2021   Laryngopharyngeal reflux 08/10/2020   Educated about COVID-19 virus infection 06/13/2020   Precordial chest pain 06/13/2020   Sleep apnea suspected 10/06/2015   Anxiety reaction 09/15/2015   PAF (paroxysmal atrial fibrillation) (HCC) 11/25/2014   Herpes labialis 07/29/2013   Well woman exam (no gynecological exam) 05/02/2013   GERD (gastroesophageal reflux disease) 11/13/2012   Overweight 06/14/2012     Current Outpatient Medications on File Prior to Visit  Medication Sig Dispense Refill   diltiazem (CARDIZEM) 30 MG tablet Take 1 tablet every 4 hours AS NEEDED for heart rate >100 30 tablet 3   flecainide (TAMBOCOR) 150 MG tablet Take 1-2 tablets by mouth once for afib can only use every 4 days 6 tablet 1   No current facility-administered medications on file prior to visit.    Allergies  Allergen Reactions    Zofran [Ondansetron Hcl] Itching    Social History   Socioeconomic History   Marital status: Single    Spouse name: Not on file   Number of children: 4   Years of education: Not on file   Highest education level: Not on file  Occupational History   Occupation: Caregiver  Tobacco Use   Smoking status: Former    Current packs/day: 0.00    Average packs/day: 0.3 packs/day for 5.0 years (1.3 ttl pk-yrs)    Types: Cigarettes    Start date: 06/14/2002    Quit date: 06/15/2007    Years since quitting: 16.4    Passive exposure: Past   Smokeless tobacco: Never  Vaping Use   Vaping status: Never Used  Substance and Sexual Activity   Alcohol use: Yes    Alcohol/week: 4.0 standard drinks of alcohol    Types: 2 Glasses of wine, 2 Standard drinks or equivalent per week    Comment: Occassionally.   Drug use: Yes    Types: Marijuana    Comment: Occassionally.   Sexual activity: Yes    Birth control/protection: Surgical, Condom  Other Topics Concern   Not on file  Social History Narrative   Lives in [place] with 3 children and has two others.     Single.   Works as a Clinical biochemist (travel)   Social Drivers of Corporate investment banker Strain: Not on [employer] Insecurity: Not on file  Transportation Needs: Not on file  Physical Activity: Not on file  Stress: Not  on file  Social Connections: Unknown (04/27/2022)   Received from [facility], [facility]   Social Network    Social Network: Not on file  Intimate Partner Violence: Unknown (04/27/2022)   Received from [facility], [facility]   HITS    Physically Hurt: Not on file    Insult or Talk Down To: Not on file    Threaten Physical Harm: Not on file    Scream or Curse: Not on file    Family History  Problem Relation Age of Onset   Diabetes Father    Hypertension Father    Stroke Father    Colon polyps Father    Breast cancer Half-Sister 82   Diabetes Paternal Grandmother    Heart disease  Paternal Grandmother    Diabetes Paternal Grandfather    Heart disease Paternal Grandfather    Colon cancer Paternal Aunt        late 70's   Breast cancer Maternal Aunt    Esophageal cancer Neg Hx    Rectal cancer Neg Hx    Stomach cancer Neg Hx     Past Surgical History:  Procedure Laterality Date   CESAREAN SECTION     TUBAL LIGATION     2008    ROS: Review of Systems Negative except as stated above  PHYSICAL EXAM: BP 118/78   Pulse 70   Temp 98.2 F (36.8 C) (Oral)   Ht 5\' 7"  (1.702 m)   Wt 209 lb 12.8 oz (95.2 kg)   LMP 10/12/2023 (Approximate)   SpO2 96%   BMI 32.86 kg/m   Physical Exam HENT:     Head: Normocephalic and atraumatic.     Right Ear: Tympanic membrane, ear canal and external ear normal.     Left Ear: Tympanic membrane, ear canal and external ear normal.     Nose: Nose normal.     Mouth/Throat:     Mouth: Mucous membranes are moist.     Pharynx: Oropharynx is clear.  Eyes:     Extraocular Movements: Extraocular movements intact.     Conjunctiva/sclera: Conjunctivae normal.     Pupils: Pupils are equal, round, and reactive to light.  Neck:     Thyroid: No thyroid mass, thyromegaly or thyroid tenderness.  Cardiovascular:     Rate and Rhythm: Normal rate and regular rhythm.     Pulses: Normal pulses.     Heart sounds: Normal heart sounds.  Pulmonary:     Effort: Pulmonary effort is normal.     Breath sounds: Normal breath sounds.  Chest:     Comments: Patient declined.  Abdominal:     General: Bowel sounds are normal.     Palpations: Abdomen is soft.  Genitourinary:    Comments: Patient declined.  Musculoskeletal:        General: Normal range of motion.     Right shoulder: Normal.     Left shoulder: Normal.     Right upper arm: Normal.     Left upper arm: Normal.     Right elbow: Normal.     Left elbow: Normal.     Right forearm: Normal.     Left forearm: Normal.     Right wrist: Normal.     Left wrist: Normal.     Right hand:  Normal.     Left hand: Normal.     Cervical back: Normal, normal range of motion and neck supple.     Thoracic back: Normal.  Lumbar back: Normal.     Right hip: Normal.     Left hip: Normal.     Right upper leg: Normal.     Left upper leg: Normal.     Right knee: Normal.     Left knee: Normal.     Right lower leg: Normal.     Left lower leg: Normal.     Right ankle: Normal.     Left ankle: Normal.     Right foot: Normal.     Left foot: Normal.  Skin:    General: Skin is warm and dry.     Capillary Refill: Capillary refill takes less than 2 seconds.  Neurological:     General: No focal deficit present.     Mental Status: She is alert and oriented to person, place, and time.  Psychiatric:        Mood and Affect: Mood normal.        Behavior: Behavior normal.     ASSESSMENT AND PLAN: 1. Annual physical exam (Primary) - Counseled on 150 minutes of exercise per week as tolerated, healthy eating (including decreased daily intake of saturated fats, cholesterol, added sugars, sodium), STI prevention, and routine healthcare maintenance.  2. Screening for metabolic disorder - Routine screening.  - CMP14+EGFR; Future  3. Screening for deficiency anemia - Routine screening.  - CBC; Future  4. Diabetes mellitus screening - Routine screening.  - Hemoglobin A1c; Future  5. Screening cholesterol level - Routine screening.  - Lipid panel; Future  6. Thyroid disorder screen - Routine screening.  - TSH; Future  7. Encounter for screening mammogram for malignant neoplasm of breast - Routine screening.  - MM Digital Screening; Future  8. Lymphedema of genitalia - Referral to Gynecology for evaluation/management.  - Ambulatory referral to Gynecology  9. Perimenopausal vasomotor symptoms - Routine screening.  - Referral to Gynecology for evaluation/management.  - Luteinizing hormone; Future - Follicle stimulating hormone; Future - Ambulatory referral to  Gynecology  10. Lumbar degenerative disc disease - Continue Cyclobenzaprine as prescribed. Counseled on medication adherence/adverse effects.  - Follow-up with primary provider as scheduled.  - cyclobenzaprine (FLEXERIL) 10 MG tablet; Take 1 tablet (10 mg total) by mouth 3 (three) times daily as needed for muscle spasms.  Dispense: 90 tablet; Refill: 0  11. Gastroesophageal reflux disease, unspecified whether esophagitis present - Continue Pantoprazole as prescribed. Counseled on medication adherence/adverse effects.  - Follow-up with primary provider as scheduled.  - pantoprazole (PROTONIX) 40 MG tablet; Take 1 tablet (40 mg total) by mouth daily.  Dispense: 90 tablet; Refill: 0  12. HSV (herpes simplex virus) infection - Continue Valacyclovir as prescribed. Counseled on medication adherence/adverse effects.  - Follow-up with primary provider as scheduled.  - valACYclovir (VALTREX) 1000 MG tablet; Take 1 tablet (1,000 mg total) by mouth 2 (two) times daily as needed (for cold sores).  Dispense: 30 tablet; Refill: 1  13. Congestion of throat - Patient today in office with no cardiopulmonary/acute distress.  - Guaifenesin as prescribed. Counseled on medication adherence/adverse effects.  - Patient declined respiratory panel/strep test.  - Follow-up with primary provider as scheduled.  - guaiFENesin 200 MG tablet; Take 1 tablet (200 mg total) by mouth every 4 (four) hours as needed for cough or to loosen phlegm.  Dispense: 30 tablet; Refill: 1    Patient was given the opportunity to ask questions.  Patient verbalized understanding of the plan and was able to repeat key elements of the plan. Patient was given  clear instructions to go to Emergency Department or return to medical center if symptoms don't improve, worsen, or new problems develop.The patient verbalized understanding.   Orders Placed This Encounter  Procedures   MM Digital Screening   CBC   Lipid panel   CMP14+EGFR    Hemoglobin A1c   TSH   Luteinizing hormone   Follicle stimulating hormone   Ambulatory referral to Gynecology     Requested Prescriptions   Signed Prescriptions Disp Refills   cyclobenzaprine (FLEXERIL) 10 MG tablet 90 tablet 0    Sig: Take 1 tablet (10 mg total) by mouth 3 (three) times daily as needed for muscle spasms.   pantoprazole (PROTONIX) 40 MG tablet 90 tablet 0    Sig: Take 1 tablet (40 mg total) by mouth daily.   valACYclovir (VALTREX) 1000 MG tablet 30 tablet 1    Sig: Take 1 tablet (1,000 mg total) by mouth 2 (two) times daily as needed (for cold sores).   guaiFENesin 200 MG tablet 30 tablet 1    Sig: Take 1 tablet (200 mg total) by mouth every 4 (four) hours as needed for cough or to loosen phlegm.    Return in about 1 year (around 11/29/2024) for Physical per patient preference.  Rema Fendt, NP

## 2023-12-07 ENCOUNTER — Other Ambulatory Visit: Payer: Medicaid Other

## 2023-12-14 ENCOUNTER — Other Ambulatory Visit: Payer: Medicaid Other

## 2023-12-14 DIAGNOSIS — Z130 Encounter for screening for diseases of the blood and blood-forming organs and certain disorders involving the immune mechanism: Secondary | ICD-10-CM

## 2023-12-14 DIAGNOSIS — Z13220 Encounter for screening for lipoid disorders: Secondary | ICD-10-CM

## 2023-12-14 DIAGNOSIS — Z13228 Encounter for screening for other metabolic disorders: Secondary | ICD-10-CM

## 2023-12-14 DIAGNOSIS — Z1329 Encounter for screening for other suspected endocrine disorder: Secondary | ICD-10-CM

## 2023-12-14 DIAGNOSIS — Z114 Encounter for screening for human immunodeficiency virus [HIV]: Secondary | ICD-10-CM

## 2023-12-14 DIAGNOSIS — Z131 Encounter for screening for diabetes mellitus: Secondary | ICD-10-CM

## 2023-12-14 DIAGNOSIS — N951 Menopausal and female climacteric states: Secondary | ICD-10-CM

## 2023-12-15 LAB — CBC
Hematocrit: 39.3 % (ref 34.0–46.6)
Hemoglobin: 12.1 g/dL (ref 11.1–15.9)
MCH: 28.1 pg (ref 26.6–33.0)
MCHC: 30.8 g/dL — ABNORMAL LOW (ref 31.5–35.7)
MCV: 91 fL (ref 79–97)
Platelets: 394 10*3/uL (ref 150–450)
RBC: 4.3 x10E6/uL (ref 3.77–5.28)
RDW: 12.7 % (ref 11.7–15.4)
WBC: 6.2 10*3/uL (ref 3.4–10.8)

## 2023-12-15 LAB — CMP14+EGFR
ALT: 21 [IU]/L (ref 0–32)
AST: 17 [IU]/L (ref 0–40)
Albumin: 4.4 g/dL (ref 3.9–4.9)
Alkaline Phosphatase: 71 [IU]/L (ref 44–121)
BUN/Creatinine Ratio: 22 (ref 9–23)
BUN: 17 mg/dL (ref 6–24)
Bilirubin Total: 0.3 mg/dL (ref 0.0–1.2)
CO2: 23 mmol/L (ref 20–29)
Calcium: 9.8 mg/dL (ref 8.7–10.2)
Chloride: 103 mmol/L (ref 96–106)
Creatinine, Ser: 0.78 mg/dL (ref 0.57–1.00)
Globulin, Total: 2.6 g/dL (ref 1.5–4.5)
Glucose: 87 mg/dL (ref 70–99)
Potassium: 4.8 mmol/L (ref 3.5–5.2)
Sodium: 139 mmol/L (ref 134–144)
Total Protein: 7 g/dL (ref 6.0–8.5)
eGFR: 94 mL/min/{1.73_m2} (ref >59)

## 2023-12-15 LAB — HEMOGLOBIN A1C
Est. average glucose Bld gHb Est-mCnc: 123 mg/dL
Hgb A1c MFr Bld: 5.9 % — ABNORMAL HIGH (ref 4.8–5.6)

## 2023-12-15 LAB — LIPID PANEL
Chol/HDL Ratio: 4.4 {ratio} (ref 0.0–4.4)
Cholesterol, Total: 232 mg/dL — ABNORMAL HIGH (ref 100–199)
HDL: 53 mg/dL (ref >39)
LDL Chol Calc (NIH): 168 mg/dL — ABNORMAL HIGH (ref 0–99)
Triglycerides: 66 mg/dL (ref 0–149)
VLDL Cholesterol Cal: 11 mg/dL (ref 5–40)

## 2023-12-15 LAB — FOLLICLE STIMULATING HORMONE: FSH: 52.7 m[IU]/mL

## 2023-12-15 LAB — TSH: TSH: 0.749 u[IU]/mL (ref 0.450–4.500)

## 2023-12-15 LAB — LUTEINIZING HORMONE: LH: 25.5 m[IU]/mL

## 2023-12-17 ENCOUNTER — Other Ambulatory Visit: Payer: Self-pay | Admitting: Family

## 2023-12-17 ENCOUNTER — Encounter: Payer: Self-pay | Admitting: Family

## 2023-12-17 DIAGNOSIS — E785 Hyperlipidemia, unspecified: Secondary | ICD-10-CM

## 2023-12-17 MED ORDER — ATORVASTATIN CALCIUM 20 MG PO TABS
20.0000 mg | ORAL_TABLET | Freq: Every day | ORAL | 0 refills | Status: DC
Start: 2023-12-17 — End: 2024-03-13

## 2023-12-30 ENCOUNTER — Encounter: Payer: Self-pay | Admitting: Family

## 2024-02-24 ENCOUNTER — Other Ambulatory Visit: Payer: Self-pay | Admitting: Family

## 2024-02-24 DIAGNOSIS — K219 Gastro-esophageal reflux disease without esophagitis: Secondary | ICD-10-CM

## 2024-02-25 ENCOUNTER — Other Ambulatory Visit: Payer: Self-pay

## 2024-02-25 DIAGNOSIS — K219 Gastro-esophageal reflux disease without esophagitis: Secondary | ICD-10-CM

## 2024-02-25 MED ORDER — PANTOPRAZOLE SODIUM 40 MG PO TBEC: 40.0000 mg | DELAYED_RELEASE_TABLET | Freq: Every day | ORAL | 0 refills | Status: DC

## 2024-03-08 ENCOUNTER — Emergency Department (HOSPITAL_BASED_OUTPATIENT_CLINIC_OR_DEPARTMENT_OTHER)
Admission: EM | Admit: 2024-03-08 | Discharge: 2024-03-08 | Disposition: A | Discharge status: Home / Self Care | Attending: Emergency Medicine | Admitting: Emergency Medicine

## 2024-03-08 ENCOUNTER — Other Ambulatory Visit: Payer: Self-pay

## 2024-03-08 DIAGNOSIS — M545 Low back pain, unspecified: Secondary | ICD-10-CM | POA: Diagnosis present

## 2024-03-08 MED ORDER — PREDNISONE 10 MG PO TABS: 20.0000 mg | ORAL_TABLET | Freq: Two times a day (BID) | ORAL | 0 refills | Status: DC

## 2024-03-08 MED ORDER — KETOROLAC TROMETHAMINE 60 MG/2ML IM SOLN
60.0000 mg | Freq: Once | INTRAMUSCULAR | Status: AC
  Administered 2024-03-08: 60 mg via INTRAMUSCULAR
  Filled 2024-03-08: qty 2

## 2024-03-08 MED ORDER — HYDROCODONE-ACETAMINOPHEN 5-325 MG PO TABS
1.0000 | ORAL_TABLET | Freq: Four times a day (QID) | ORAL | 0 refills | Status: DC | PRN
Start: 2024-03-08 — End: 2024-05-08

## 2024-03-08 MED ORDER — OXYCODONE-ACETAMINOPHEN 5-325 MG PO TABS
2.0000 | ORAL_TABLET | Freq: Once | ORAL | Status: AC
  Administered 2024-03-08: 2 via ORAL
  Filled 2024-03-08: qty 2

## 2024-03-08 MED ORDER — PREDNISONE 50 MG PO TABS
60.0000 mg | ORAL_TABLET | Freq: Once | ORAL | Status: AC
  Administered 2024-03-08: 60 mg via ORAL
  Filled 2024-03-08: qty 1

## 2024-03-08 NOTE — Discharge Instructions (Signed)
Begin taking prednisone as prescribed.  Begin taking hydrocodone as prescribed as needed for pain.  Follow-up with your primary doctor if symptoms or not improving in the next week.

## 2024-03-08 NOTE — ED Triage Notes (Signed)
 Pt POV reporting lower back pain that has progressively worsened tonight, unable to walk without extreme pain, states she raked her yard earlier today. Took ibuprofen at 4pm with slight improvement then pain returned.

## 2024-03-08 NOTE — ED Provider Notes (Signed)
 [place] EMERGENCY DEPARTMENT AT [facility] Provider Note   CSN: [id number] Arrival date & time: 03/08/24  7829     History  Chief Complaint  Patient presents with   Back Pain    Melanie Vincent is a 48 y.o. female.  Patient is a 48 year old female with history of GERD.  Patient presenting today with complaints of low back pain.  Pain started this evening after raking her yard earlier today.  She describes severe pain to the left lower lumbar region that radiates into her buttock and leg.  She denies any weakness.  She does have difficulty ambulating secondary to the pain.  No bowel or bladder complaints.  She has taken ibuprofen with little relief.       Home Medications Prior to Admission medications   Medication Sig Start Date End Date Taking? Authorizing Provider  atorvastatin (LIPITOR) 20 MG tablet Take 1 tablet (20 mg total) by mouth daily. 12/17/23   Rema Fendt, NP  cyclobenzaprine (FLEXERIL) 10 MG tablet Take 1 tablet (10 mg total) by mouth 3 (three) times daily as needed for muscle spasms. 11/30/23   Rema Fendt, NP  diltiazem (CARDIZEM) 30 MG tablet Take 1 tablet every 4 hours AS NEEDED for heart rate >100 01/03/23   Cristopher Peru, PA-C  flecainide (TAMBOCOR) 150 MG tablet Take 1-2 tablets by mouth once for afib can only use every 4 days 01/03/23   Cristopher Peru, PA-C  guaiFENesin 200 MG tablet Take 1 tablet (200 mg total) by mouth every 4 (four) hours as needed for cough or to loosen phlegm. 11/30/23   Rema Fendt, NP  pantoprazole (PROTONIX) 40 MG tablet Take 1 tablet by mouth once daily 02/25/24   Rema Fendt, NP  pantoprazole (PROTONIX) 40 MG tablet Take 1 tablet (40 mg total) by mouth daily. 02/25/24   Rema Fendt, NP  valACYclovir (VALTREX) 1000 MG tablet Take 1 tablet (1,000 mg total) by mouth 2 (two) times daily as needed (for cold sores). 11/30/23   Rema Fendt, NP      Allergies    Zofran Frazier Richards hcl]    Review of  Systems   Review of Systems  All other systems reviewed and are negative.   Physical Exam Updated Vital Signs BP 127/82   Pulse 76   Temp 98 F (36.7 C) (Temporal)   Resp 17   Ht 5\' 6"  (1.676 m)   Wt 90.7 kg   SpO2 99%   BMI 32.28 kg/m  Physical Exam Vitals and nursing note reviewed.  Constitutional:      Appearance: Normal appearance.  HENT:     Head: Normocephalic and atraumatic.  Pulmonary:     Effort: Pulmonary effort is normal.  Musculoskeletal:     Comments: There is tenderness to palpation in the soft tissues of the left lower lumbar region.  There is no bony tenderness or palpable abnormality.  Skin:    General: Skin is warm and dry.  Neurological:     Mental Status: She is alert and oriented to person, place, and time.     Comments: Strength is 5 out of 5 in both lower extremities.  Sensation is intact throughout both lower extremities.  DTRs are 1+ and symmetrical in the patellar and Achilles tendons bilaterally.     ED Results / Procedures / Treatments   Labs (all labs ordered are listed, but only abnormal results are displayed) Labs Reviewed - No data to display  EKG None  Radiology No results found.  Procedures Procedures    Medications Ordered in ED Medications  ketorolac (TORADOL) injection 60 mg (has no administration in time range)  oxyCODONE-acetaminophen (PERCOCET/ROXICET) 5-325 MG per tablet 2 tablet (has no administration in time range)  predniSONE (DELTASONE) tablet 60 mg (has no administration in time range)    ED Course/ Medical Decision Making/ A&P  Patient is a 48 year old female presenting with low back pain as described in the HPI.  Patient arrives with stable vital signs and is afebrile.  There are no red flags on her exam that would suggest an emergent situation.  Strength and reflexes are symmetrical and there are no bowel or bladder issues.  Patient was given IM Toradol along with 2 Percocet and 60 mg of prednisone.  She is  now feeling much improved.  She will be discharged with prednisone and pain medication.  To follow-up as needed.  Final Clinical Impression(s) / ED Diagnoses Final diagnoses:  None    Rx / DC Orders ED Discharge Orders     None         Geoffery Lyons, MD 03/08/24 [phone]

## 2024-03-11 ENCOUNTER — Ambulatory Visit: Payer: Self-pay

## 2024-03-11 NOTE — Telephone Encounter (Signed)
 Copied from CRM [id number]. Topic: Clinical - Red Word Triage >> Mar 11, 2024 12:20 PM Melanie Vincent wrote: [facility] that prompted transfer to Nurse Triage: severe back pain w/ lt leg pain and weakness. Hospital f/u  Chief Complaint: lower back pain; left leg weak Symptoms: see above Frequency: comes and goes Pertinent Negatives: Patient denies fever, numbness, Disposition: [] ED /[x] Urgent Care (no appt availability in office) / [] Appointment(In office/virtual)/ []  [place] [facility]/ [] Home Care/ [] Refused Recommended Disposition /[] [place] [facility]/ []  Follow-up with PCP Additional Notes: no apt available, instructed to go to UC or ER.  Would like to be seen in office; care advice given, denies questions; instructed to go to ER if becomes worse. Office to call patient back for apt.   Reason for Disposition  [1] SEVERE back pain (e.g., excruciating, unable to do any normal activities) AND [2] not improved 2 hours after pain medicine  Answer Assessment - Initial Assessment Questions 1. ONSET: "When did the pain begin?"      Friday am 2. LOCATION: "Where does it hurt?" (upper, mid or lower back)     Lower back on left side 3. SEVERITY: "How bad is the pain?"  (e.g., Scale 1-10; mild, moderate, or severe)   - MILD (1-3): Doesn't interfere with normal activities.    - MODERATE (4-7): Interferes with normal activities or awakens from sleep.    - SEVERE (8-10): Excruciating pain, unable to do any normal activities.      8/10 4. PATTERN: "Is the pain constant?" (e.g., yes, no; constant, intermittent)      constant 5. RADIATION: "Does the pain shoot into your legs or somewhere else?"     Left leg and weakness 6. CAUSE:  "What do you think is causing the back pain?"      Unknown, was at ER 7. BACK OVERUSE:  "Any recent lifting of heavy objects, strenuous work or exercise?"     A lot of walking, sister was in hospital  8. MEDICINES: "What have you taken so far for the pain?" (e.g.,  nothing, acetaminophen, NSAIDS)     Advil, tylenol 9. NEUROLOGIC SYMPTOMS: "Do you have any weakness, numbness, or problems with bowel/bladder control?"     Weakness, legs are weak 10. OTHER SYMPTOMS: "Do you have any other symptoms?" (e.g., fever, abdomen pain, burning with urination, blood in urine)       Abd pain 11. PREGNANCY: "Is there any chance you are pregnant?" "When was your last menstrual period?"       na  Protocols used: Back Pain-A-AH

## 2024-03-12 ENCOUNTER — Encounter: Payer: Self-pay | Admitting: Family

## 2024-03-12 ENCOUNTER — Ambulatory Visit (INDEPENDENT_AMBULATORY_CARE_PROVIDER_SITE_OTHER)

## 2024-03-12 ENCOUNTER — Ambulatory Visit: Admitting: Family

## 2024-03-12 VITALS — BP 137/87 | HR 78 | Temp 98.5°F | Ht 66.5 in | Wt 212.6 lb

## 2024-03-12 DIAGNOSIS — G8929 Other chronic pain: Secondary | ICD-10-CM | POA: Diagnosis not present

## 2024-03-12 DIAGNOSIS — M545 Low back pain, unspecified: Secondary | ICD-10-CM

## 2024-03-12 DIAGNOSIS — Z114 Encounter for screening for human immunodeficiency virus [HIV]: Secondary | ICD-10-CM

## 2024-03-12 DIAGNOSIS — Z13220 Encounter for screening for lipoid disorders: Secondary | ICD-10-CM

## 2024-03-12 NOTE — Progress Notes (Signed)
 Patient ID: Melanie Vincent, female    DOB: [birth year]  MRN: [id number]  CC: Emergency Department Follow-Up  Subjective: Melanie Vincent is a 48 y.o. female who presents for Emergency Department follow-up.  Her concerns today include:  - Patient recently seen on 03/08/2024 at [facility] Emergency Department at [facility] for back pain. Reports back pain persisting. Denies recent trauma/injury and red flag symptoms. Reports doing well on Percocet and Prednisone. Reports she is no longer taking Cyclobenzaprine due to taking Percocet.  - Routine HIV test.   Patient Active Problem List   Diagnosis Date Noted   Bilateral carpal tunnel syndrome 02/28/2023   Paresthesia 08/21/2022   Bacterial vaginitis 05/26/2021   Lumbar degenerative disc disease 05/25/2021   Laryngopharyngeal reflux 08/10/2020   Educated about COVID-19 virus infection 06/13/2020   Precordial chest pain 06/13/2020   Sleep apnea suspected 10/06/2015   Anxiety reaction 09/15/2015   PAF (paroxysmal atrial fibrillation) (HCC) 11/25/2014   Herpes labialis 07/29/2013   Well woman exam (no gynecological exam) 05/02/2013   GERD (gastroesophageal reflux disease) 11/13/2012   Overweight 06/14/2012     Current Outpatient Medications on File Prior to Visit  Medication Sig Dispense Refill   cyclobenzaprine (FLEXERIL) 10 MG tablet Take 1 tablet (10 mg total) by mouth 3 (three) times daily as needed for muscle spasms. 90 tablet 0   diltiazem (CARDIZEM) 30 MG tablet Take 1 tablet every 4 hours AS NEEDED for heart rate >100 30 tablet 3   HYDROcodone-acetaminophen (NORCO/VICODIN) 5-325 MG tablet Take 1-2 tablets by mouth every 6 (six) hours as needed. 15 tablet 0   pantoprazole (PROTONIX) 40 MG tablet Take 1 tablet by mouth once daily 90 tablet 0   predniSONE (DELTASONE) 10 MG tablet Take 2 tablets (20 mg total) by mouth 2 (two) times daily with a meal. 20 tablet 0   valACYclovir (VALTREX) 1000 MG tablet Take 1 tablet (1,000  mg total) by mouth 2 (two) times daily as needed (for cold sores). 30 tablet 1   atorvastatin (LIPITOR) 20 MG tablet Take 1 tablet (20 mg total) by mouth daily. (Patient not taking: Reported on 03/12/2024) 90 tablet 0   flecainide (TAMBOCOR) 150 MG tablet Take 1-2 tablets by mouth once for afib can only use every 4 days (Patient not taking: Reported on 03/12/2024) 6 tablet 1   guaiFENesin 200 MG tablet Take 1 tablet (200 mg total) by mouth every 4 (four) hours as needed for cough or to loosen phlegm. (Patient not taking: Reported on 03/12/2024) 30 tablet 1   pantoprazole (PROTONIX) 40 MG tablet Take 1 tablet (40 mg total) by mouth daily. (Patient not taking: Reported on 03/12/2024) 90 tablet 0   No current facility-administered medications on file prior to visit.    Allergies  Allergen Reactions   Zofran [Ondansetron Hcl] Itching    Social History   Socioeconomic History   Marital status: Single    Spouse name: Not on file   Number of children: 4   Years of education: Not on file   Highest education level: Not on file  Occupational History   Occupation: Caregiver  Tobacco Use   Smoking status: Former    Current packs/day: 0.00    Average packs/day: 0.3 packs/day for 5.0 years (1.3 ttl pk-yrs)    Types: Cigarettes    Start date: 06/14/2002    Quit date: 06/15/2007    Years since quitting: 16.7    Passive exposure: Past   Smokeless tobacco: Never  Vaping Use   Vaping status: Never Used  Substance and Sexual Activity   Alcohol use: Yes    Alcohol/week: 4.0 standard drinks of alcohol    Types: 2 Glasses of wine, 2 Standard drinks or equivalent per week    Comment: Occassionally.   Drug use: Yes    Types: Marijuana    Comment: Occassionally.   Sexual activity: Yes    Birth control/protection: Surgical, Condom  Other Topics Concern   Not on file  Social History Narrative   Lives in [place] with 3 children and has two others.     Single.   Works as a Clinical biochemist (travel)    Social Drivers of Corporate investment banker Strain: Not on [employer] Insecurity: Not on file  Transportation Needs: Not on file  Physical Activity: Not on file  Stress: Not on file  Social Connections: Unknown (04/27/2022)   Received from [facility], [facility]   Social Network    Social Network: Not on file  Intimate Partner Violence: Unknown (04/27/2022)   Received from [facility], [facility]   HITS    Physically Hurt: Not on file    Insult or Talk Down To: Not on file    Threaten Physical Harm: Not on file    Scream or Curse: Not on file    Family History  Problem Relation Age of Onset   Diabetes Father    Hypertension Father    Stroke Father    Colon polyps Father    Breast cancer Half-Sister 57   Diabetes Paternal Grandmother    Heart disease Paternal Grandmother    Diabetes Paternal Grandfather    Heart disease Paternal Grandfather    Colon cancer Paternal Aunt        late 70's   Breast cancer Maternal Aunt    Esophageal cancer Neg Hx    Rectal cancer Neg Hx    Stomach cancer Neg Hx     Past Surgical History:  Procedure Laterality Date   CESAREAN SECTION     TUBAL LIGATION     2008    ROS: Review of Systems Negative except as stated above  PHYSICAL EXAM: BP 137/87   Pulse 78   Temp 98.5 F (36.9 C) (Oral)   Ht 5' 6.5" (1.689 m)   Wt 212 lb 9.6 oz (96.4 kg)   SpO2 98%   BMI 33.80 kg/m   Physical Exam HENT:     Head: Normocephalic and atraumatic.     Nose: Nose normal.     Mouth/Throat:     Mouth: Mucous membranes are moist.     Pharynx: Oropharynx is clear.  Eyes:     Extraocular Movements: Extraocular movements intact.     Conjunctiva/sclera: Conjunctivae normal.     Pupils: Pupils are equal, round, and reactive to light.  Cardiovascular:     Rate and Rhythm: Normal rate and regular rhythm.     Pulses: Normal pulses.     Heart sounds: Normal heart sounds.  Pulmonary:     Effort: Pulmonary effort is normal.     Breath  sounds: Normal breath sounds.  Musculoskeletal:        General: Normal range of motion.     Right shoulder: Normal.     Left shoulder: Normal.     Right upper arm: Normal.     Left upper arm: Normal.     Right elbow: Normal.     Left elbow: Normal.  Right forearm: Normal.     Left forearm: Normal.     Right wrist: Normal.     Left wrist: Normal.     Right hand: Normal.     Left hand: Normal.     Cervical back: Normal, normal range of motion and neck supple.     Thoracic back: Normal.     Lumbar back: Normal.     Right hip: Normal.     Left hip: Normal.     Right upper leg: Normal.     Left upper leg: Normal.     Right knee: Normal.     Left knee: Normal.     Right lower leg: Normal.     Left lower leg: Normal.     Right ankle: Normal.     Left ankle: Normal.     Right foot: Normal.     Left foot: Normal.  Neurological:     General: No focal deficit present.     Mental Status: She is alert and oriented to person, place, and time.  Psychiatric:        Mood and Affect: Mood normal.        Behavior: Behavior normal.     ASSESSMENT AND PLAN: 1. Chronic low back pain, unspecified back pain laterality, unspecified whether sciatica present (Primary) - Continue present management.  - Diagnostic lumbar spine for evaluation. - Referral to Orthopedic Surgery for evaluation/management.  - Follow-up with primary provider as scheduled. - DG Lumbar Spine Complete; Future - Ambulatory referral to Orthopedic Surgery  2. Screening cholesterol level - Routine screening.  - Lipid panel  3. Encounter for screening for HIV - Routine screening.  - HIV antibody (with reflex)   Patient was given the opportunity to ask questions.  Patient verbalized understanding of the plan and was able to repeat key elements of the plan. Patient was given clear instructions to go to Emergency Department or return to medical center if symptoms don't improve, worsen, or new problems develop.The  patient verbalized understanding.   Orders Placed This Encounter  Procedures   DG Lumbar Spine Complete   HIV antibody (with reflex)   Lipid panel   Ambulatory referral to Orthopedic Surgery   Follow-up with primary provider as scheduled.  Rema Fendt, NP

## 2024-03-13 ENCOUNTER — Encounter: Payer: Self-pay | Admitting: Family

## 2024-03-13 ENCOUNTER — Other Ambulatory Visit: Payer: Self-pay | Admitting: Family

## 2024-03-13 DIAGNOSIS — E785 Hyperlipidemia, unspecified: Secondary | ICD-10-CM

## 2024-03-13 LAB — LIPID PANEL
Chol/HDL Ratio: 3.4 ratio (ref 0.0–4.4)
Cholesterol, Total: 192 mg/dL (ref 100–199)
HDL: 57 mg/dL (ref >39)
LDL Chol Calc (NIH): 125 mg/dL — ABNORMAL HIGH (ref 0–99)
Triglycerides: 55 mg/dL (ref 0–149)
VLDL Cholesterol Cal: 10 mg/dL (ref 5–40)

## 2024-03-13 LAB — HIV ANTIBODY (ROUTINE TESTING W REFLEX): HIV Screen 4th Generation wRfx: NONREACTIVE

## 2024-03-13 MED ORDER — ATORVASTATIN CALCIUM 20 MG PO TABS
20.0000 mg | ORAL_TABLET | Freq: Every day | ORAL | 0 refills | Status: AC
Start: 2024-03-13 — End: ?

## 2024-03-20 ENCOUNTER — Ambulatory Visit: Admitting: Physical Medicine and Rehabilitation

## 2024-03-27 ENCOUNTER — Encounter: Payer: Self-pay | Admitting: Physical Medicine and Rehabilitation

## 2024-03-27 ENCOUNTER — Ambulatory Visit (INDEPENDENT_AMBULATORY_CARE_PROVIDER_SITE_OTHER): Admitting: Physical Medicine and Rehabilitation

## 2024-03-27 DIAGNOSIS — M5416 Radiculopathy, lumbar region: Secondary | ICD-10-CM

## 2024-03-27 DIAGNOSIS — M4316 Spondylolisthesis, lumbar region: Secondary | ICD-10-CM | POA: Diagnosis not present

## 2024-03-27 DIAGNOSIS — M47816 Spondylosis without myelopathy or radiculopathy, lumbar region: Secondary | ICD-10-CM

## 2024-03-27 DIAGNOSIS — M5442 Lumbago with sciatica, left side: Secondary | ICD-10-CM | POA: Diagnosis not present

## 2024-03-27 DIAGNOSIS — G8929 Other chronic pain: Secondary | ICD-10-CM | POA: Diagnosis not present

## 2024-03-27 NOTE — Progress Notes (Signed)
 Pain Scale   Average Pain 5 Patient advising aprox. 1 week ago she was getting out of car and felt a "twinge" in her left lower back area. Patient advised she went to ED and they gave her pain meds and pred, which helped for a little while. Pain is back now and bilaterally in groin area.        +Driver, -BT, -Dye Allergies.

## 2024-03-27 NOTE — Progress Notes (Signed)
 Melanie Vincent - 48 y.o. female MRN [id number]  Date of birth: 28-Jul-1976  Office Visit Note: Visit Date: 03/27/2024 PCP: Senaida Dama, NP Referred by: Senaida Dama, NP  Subjective: Chief Complaint  Patient presents with   Lower Back - Pain   HPI: Melanie Vincent is a 48 y.o. female who comes in today per the request of Lavona Pounds, NP for evaluation of acute on chronic bilateral lower back pain radiating down posterior left leg. She reports chronic issues with lower back pain, her pain increased several weeks ago. She was seen in the emergency department on 03/08/2024 and was prescribed prednisone , toradol  and percocet. She reports some relief of pain for about 1 week. Recent lumbar radiographs show grade 1 anterolisthesis of L5 on S1, mild degenerative joint changes of mid to lower lumbar spine. There does appear to be widening/sclerosis of bilateral SI joints. No specific aggravating factors that increase her pain, states her discomfort is constant, describes as aching and dullness sensation, currently rates as 7 out of 10. Some relief of pain with home exercise regimen, rest and use of medications. No history of formal physical therapy. No history of lumbar surgery/injections. She was treated at [facility] in 2018 where she underwent right sacroiliac joint injection with minimal relief of pain. Patient is currently working full time in operating room. Patient denies focal weakness, numbness and tingling. No recent trauma or falls.      Review of Systems  Musculoskeletal:  Positive for back pain and myalgias.  Neurological:  Negative for tingling, sensory change, focal weakness and weakness.  All other systems reviewed and are negative.  Otherwise per HPI.  Assessment & Plan: Visit Diagnoses:    ICD-10-CM   1. Chronic bilateral low back pain with left-sided sciatica  G89.29 MR LUMBAR SPINE WO CONTRAST   M54.42     2. Lumbar radiculopathy  M54.16 MR  LUMBAR SPINE WO CONTRAST    3. Spondylolisthesis of lumbar region  M43.16 MR LUMBAR SPINE WO CONTRAST    4. Facet arthropathy, lumbar  M47.816 MR LUMBAR SPINE WO CONTRAST       Plan: Findings:  Acute on chronic bilateral lower back pain radiating down posterior left leg. Patient continues to have severe pain despite good conservative therapies such as home exercise regimen, rest and use of medications. Patients clinical presentation and exam are consistent with S1 nerve pattern. I also feel there is a myofascial component working to exacerbate her pain. She does have myofascial tenderness to bilateral lumbar paraspinal regions upon palpation. We discussed treatment plan in detail today. Recent lumbar x-rays shows facet arthropathy to lower lumbar spine, there is also grade 1 anterolisthesis of L5 on S1. Given the chronicity of her symptoms and continued severe pain I place order for lumbar MRI imaging. I will see her back for lumbar MRI review. I do think she would benefit from short course of formal physical therapy at some point. She has no questions at this time. No red flag symptoms noted upon exam today.     Meds & Orders: No orders of the defined types were placed in this encounter.   Orders Placed This Encounter  Procedures   MR LUMBAR SPINE WO CONTRAST    Follow-up: Return for Lumbar MRI review.   Procedures: No procedures performed      Clinical History: No specialty comments available.   She reports that she quit smoking about 16 years ago. Her smoking use included  cigarettes. She started smoking about 21 years ago. She has a 1.3 pack-year smoking history. She has been exposed to tobacco smoke. She has never used smokeless tobacco.  Recent Labs    12/14/23 0940  HGBA1C 5.9*    Objective:  VS:  HT:    WT:   BMI:     BP:   HR: bpm  TEMP: ( )  RESP:  Physical Exam Vitals and nursing note reviewed.  HENT:     Head: Normocephalic and atraumatic.     Right Ear:  External ear normal.     Left Ear: External ear normal.     Nose: Nose normal.     Mouth/Throat:     Mouth: Mucous membranes are moist.  Eyes:     Extraocular Movements: Extraocular movements intact.  Cardiovascular:     Rate and Rhythm: Normal rate.     Pulses: Normal pulses.  Pulmonary:     Effort: Pulmonary effort is normal.  Abdominal:     General: Abdomen is flat. There is no distension.  Musculoskeletal:        General: Tenderness present.     Cervical back: Normal range of motion.     Comments: Patient rises from seated position to standing without difficulty. Good lumbar range of motion. No pain noted with facet loading. 5/5 strength noted with bilateral hip flexion, knee flexion/extension, ankle dorsiflexion/plantarflexion and EHL. No clonus noted bilaterally. No pain upon palpation of greater trochanters. No pain with internal/external rotation of bilateral hips. Sensation intact bilaterally. Dysesthesias noted to left S1 dermatome. Myofascial tenderness noted to bilateral lumbar paraspinal region upon palpation. Negative slump test bilaterally. Ambulates without aid, gait steady.     Skin:    General: Skin is warm and dry.     Capillary Refill: Capillary refill takes less than 2 seconds.  Neurological:     General: No focal deficit present.     Mental Status: She is alert and oriented to person, place, and time.  Psychiatric:        Mood and Affect: Mood normal.        Behavior: Behavior normal.     Ortho Exam  Imaging: No results found.  Past Medical/Family/Surgical/Social History: Medications & Allergies reviewed per EMR, new medications updated. Patient Active Problem List   Diagnosis Date Noted   Bilateral carpal tunnel syndrome 02/28/2023   Paresthesia 08/21/2022   Bacterial vaginitis 05/26/2021   Lumbar degenerative disc disease 05/25/2021   Laryngopharyngeal reflux 08/10/2020   Educated about COVID-19 virus infection 06/13/2020   Precordial chest pain  06/13/2020   Sleep apnea suspected 10/06/2015   Anxiety reaction 09/15/2015   PAF (paroxysmal atrial fibrillation) (HCC) 11/25/2014   Herpes labialis 07/29/2013   Well woman exam (no gynecological exam) 05/02/2013   GERD (gastroesophageal reflux disease) 11/13/2012   Overweight 06/14/2012   Past Medical History:  Diagnosis Date   Anxiety    Breast lump    Mammogram (last 2013)   DDD (degenerative disc disease), lumbar    GERD (gastroesophageal reflux disease)    Herpes simplex labialis    PAF (paroxysmal atrial fibrillation) (HCC)    Family History  Problem Relation Age of Onset   Diabetes Father    Hypertension Father    Stroke Father    Colon polyps Father    Breast cancer Half-Sister 42   Diabetes Paternal Grandmother    Heart disease Paternal Grandmother    Diabetes Paternal Grandfather    Heart disease Paternal Grandfather  Colon cancer Paternal Aunt        late 70's   Breast cancer Maternal Aunt    Esophageal cancer Neg Hx    Rectal cancer Neg Hx    Stomach cancer Neg Hx    Past Surgical History:  Procedure Laterality Date   CESAREAN SECTION     TUBAL LIGATION     2008   Social History   Occupational History   Occupation: Caregiver  Tobacco Use   Smoking status: Former    Current packs/day: 0.00    Average packs/day: 0.3 packs/day for 5.0 years (1.3 ttl pk-yrs)    Types: Cigarettes    Start date: 06/14/2002    Quit date: 06/15/2007    Years since quitting: 16.8    Passive exposure: Past   Smokeless tobacco: Never  Vaping Use   Vaping status: Never Used  Substance and Sexual Activity   Alcohol use: Yes    Alcohol/week: 4.0 standard drinks of alcohol    Types: 2 Glasses of wine, 2 Standard drinks or equivalent per week    Comment: Occassionally.   Drug use: Yes    Types: Marijuana    Comment: Occassionally.   Sexual activity: Yes    Birth control/protection: Surgical, Condom

## 2024-04-03 ENCOUNTER — Encounter: Payer: Self-pay | Admitting: Physical Medicine and Rehabilitation

## 2024-04-07 ENCOUNTER — Ambulatory Visit
Admission: RE | Admit: 2024-04-07 | Discharge: 2024-04-07 | Disposition: A | Discharge status: Home / Self Care | Source: Ambulatory Visit | Attending: Physical Medicine and Rehabilitation | Admitting: Physical Medicine and Rehabilitation

## 2024-04-07 DIAGNOSIS — M5416 Radiculopathy, lumbar region: Secondary | ICD-10-CM

## 2024-04-07 DIAGNOSIS — G8929 Other chronic pain: Secondary | ICD-10-CM

## 2024-04-07 DIAGNOSIS — M4316 Spondylolisthesis, lumbar region: Secondary | ICD-10-CM

## 2024-04-07 DIAGNOSIS — M47816 Spondylosis without myelopathy or radiculopathy, lumbar region: Secondary | ICD-10-CM

## 2024-04-11 ENCOUNTER — Other Ambulatory Visit: Payer: Self-pay | Admitting: Physical Medicine and Rehabilitation

## 2024-04-11 ENCOUNTER — Telehealth: Payer: Self-pay | Admitting: Physical Medicine and Rehabilitation

## 2024-04-11 MED ORDER — METHOCARBAMOL 500 MG PO TABS: 500.0000 mg | ORAL_TABLET | Freq: Three times a day (TID) | ORAL | 0 refills | Status: AC

## 2024-04-11 MED ORDER — MELOXICAM 15 MG PO TABS: 15.0000 mg | ORAL_TABLET | Freq: Every day | ORAL | 0 refills | Status: AC

## 2024-04-11 NOTE — Telephone Encounter (Signed)
 Patient called. Says her pain is bad. Would like some pain medication and a call from Megan.

## 2024-04-15 ENCOUNTER — Encounter: Payer: Self-pay | Admitting: Family

## 2024-04-28 ENCOUNTER — Ambulatory Visit: Admitting: Physical Medicine and Rehabilitation

## 2024-05-08 ENCOUNTER — Other Ambulatory Visit (HOSPITAL_COMMUNITY)
Admission: RE | Admit: 2024-05-08 | Discharge: 2024-05-08 | Disposition: A | Discharge status: Home / Self Care | Source: Ambulatory Visit | Attending: Obstetrics and Gynecology | Admitting: Obstetrics and Gynecology

## 2024-05-08 ENCOUNTER — Ambulatory Visit: Admitting: Obstetrics and Gynecology

## 2024-05-08 ENCOUNTER — Encounter: Payer: Self-pay | Admitting: Obstetrics and Gynecology

## 2024-05-08 ENCOUNTER — Other Ambulatory Visit: Payer: Self-pay

## 2024-05-08 VITALS — BP 119/84 | HR 83 | Wt 210.0 lb

## 2024-05-08 DIAGNOSIS — Z0000 Encounter for general adult medical examination without abnormal findings: Secondary | ICD-10-CM

## 2024-05-08 DIAGNOSIS — Z124 Encounter for screening for malignant neoplasm of cervix: Secondary | ICD-10-CM | POA: Insufficient documentation

## 2024-05-08 DIAGNOSIS — Z1331 Encounter for screening for depression: Secondary | ICD-10-CM

## 2024-05-08 NOTE — Progress Notes (Signed)
 ANNUAL EXAM Patient name: Melanie Vincent MRN [id number]  Date of birth: [birth year] Chief Complaint:   Gynecologic Exam  History of Present Illness:   Melanie Vincent is a 48 y.o. [id number] being seen today for a routine annual exam.  Current complaints: annual  Menstrual concerns? Yes  no menses for at least 6 months Breast or nipple changes? No  Contraception use? Yes postmenopausal (ish); condomsintermittently  Sexually active? Yes female partner, some discomfort; vaginal dryness  Sister died 2 weeks of   Discussed the use of AI scribe software for clinical note transcription with the patient, who gave verbal consent to proceed.  History of Present Illness Melanie Vincent is a 48 year old female who presents for a Pap smear and evaluation of menopausal symptoms.  She has not had a menstrual period for at least the last six months, with previous periods being irregular and characterized by spotting every three months. No postmenopausal bleeding has been noted.  She experiences vaginal dryness, primarily during intercourse, and sometimes uses water-based lubricants like KY jelly. She has also tried Replens in the past.  She mentions a sensation of a 'BV feeling' with an associated smell, which she manages with over-the-counter boric acid, noting it provides instant relief and freshness within a few days.  She has a history of a CT scan noting a lymph node and occasionally experiences a 'funny feeling' on her right side, though she is unsure if she can feel the lymph node herself.  She inquires about potential issues with scar tissue from prior C-sections, particularly concerning bowel function, but has not experienced any related symptoms. No breast or nipple concerns are present.    No LMP recorded. Patient is premenopausal.   The pregnancy intention screening data noted above was reviewed. Potential methods of contraception were discussed. The patient elected to proceed with  No data recorded.   Last pap     Component Value Date/Time   DIAGPAP  08/24/2020 1121    - Negative for intraepithelial lesion or malignancy (NILM)   HPVHIGH Negative 08/24/2020 1121   ADEQPAP  08/24/2020 1121    Satisfactory for evaluation; transformation zone component ABSENT.    Last mammogram: none seen on file.  Last colonoscopy: n/a.      05/24/2022    3:58 PM 12/23/2021    1:24 PM 05/25/2021    9:04 AM 08/24/2020   11:27 AM 02/12/2019    3:00 PM  Depression screen PHQ 2/9  Decreased Interest 0 0 0 0 0  Down, Depressed, Hopeless 0 0 0 0 0  PHQ - 2 Score 0 0 0 0 0  Altered sleeping   0  0  Tired, decreased energy   0  3  Change in appetite   0  1  Feeling bad or failure about yourself    0  0  Trouble concentrating   0  1  Moving slowly or fidgety/restless   0  0  Suicidal thoughts   0  0  PHQ-9 Score   0  5  Difficult doing work/chores   Not difficult at all          02/12/2019    3:00 PM  GAD 7 : Generalized Anxiety Score  Nervous, Anxious, on Edge 3  Control/stop worrying 3  Worry too much - different things 3  Trouble relaxing 3  Restless 3  Easily annoyed or irritable 3  Afraid - awful might happen 3  Total GAD 7  Score 21     Review of Systems:   Pertinent items are noted in HPI Denies any headaches, blurred vision, fatigue, shortness of breath, chest pain, abdominal pain, abnormal vaginal discharge/itching/odor/irritation, problems with periods, bowel movements, urination, or intercourse unless otherwise stated above. Pertinent History Reviewed:  Reviewed past medical,surgical, social and family history.  Reviewed problem list, medications and allergies. Physical Assessment:   Vitals:   05/08/24 1424  BP: 119/84  Pulse: 83  Weight: 210 lb (95.3 kg)  Body mass index is 33.39 kg/m.        Physical Examination:   General appearance - well appearing, and in no distress  Mental status - alert, oriented to person, place, and time  Psych:  She has a  normal mood and affect  Skin - warm and dry, normal color, no suspicious lesions noted  Chest - effort normal, all lung fields clear to auscultation bilaterally  Heart - normal rate and regular rhythm  Breasts - breasts appear normal, no suspicious masses, no skin or nipple changes or  axillary nodes  Abdomen - soft, nontender, nondistended, no masses or organomegaly  Pelvic -  VULVA: normal appearing vulva with no masses, tenderness or lesions   VAGINA: normal appearing vagina with normal color and discharge, no lesions   CERVIX: normal appearing cervix without discharge or lesions, no CMT  Thin prep pap is done with HR HPV cotesting  UTERUS: uterus is felt to be normal size, shape, consistency and nontender   ADNEXA: No adnexal masses or tenderness noted.  Extremities:  No swelling or varicosities noted  Chaperone present for exam  No results found for this or any previous visit (from the past 24 hours).    Assessment & Plan:   Assessment & Plan Vaginal dryness Discussed management options including lubricants, moisturizers, and vaginal estrogen. - Consider coconut oil as a natural lubricant. - Consider vaginal moisturizers such as Bonafide, Reveri, or Replens. - Consider vaginal estrogen if other treatments are ineffective.  Bacterial vaginosis Symptoms managed effectively with over-the-counter boric acid. - Continue using over-the-counter boric acid as needed for symptom management.  Well woman examination - Cervical cancer screening: Discussed guidelines. Pap with HPV collected - STD Testing: accepts - Breast Health: Encouraged self breast awareness/SBE. Teaching provided. Discussed limits of clinical breast exam for detecting breast cancer. Rx given for MXR - F/U 12 months and prn    No orders of the defined types were placed in this encounter.   Meds: No orders of the defined types were placed in this encounter.   Follow-up: No follow-ups on file.  Kiki Pelton, MD 05/08/2024 3:01 PM

## 2024-05-12 LAB — CYTOLOGY - PAP
Adequacy: ABSENT
Chlamydia: NEGATIVE
Comment: NEGATIVE
Comment: NEGATIVE
Comment: NEGATIVE
Comment: NORMAL
Diagnosis: NEGATIVE
High risk HPV: NEGATIVE
Neisseria Gonorrhea: NEGATIVE
Trichomonas: NEGATIVE

## 2024-05-13 ENCOUNTER — Ambulatory Visit: Payer: Self-pay | Admitting: Obstetrics and Gynecology

## 2024-05-23 ENCOUNTER — Ambulatory Visit

## 2024-05-26 ENCOUNTER — Ambulatory Visit (INDEPENDENT_AMBULATORY_CARE_PROVIDER_SITE_OTHER): Admitting: *Deleted

## 2024-05-26 ENCOUNTER — Ambulatory Visit (INDEPENDENT_AMBULATORY_CARE_PROVIDER_SITE_OTHER)

## 2024-05-26 DIAGNOSIS — Z111 Encounter for screening for respiratory tuberculosis: Secondary | ICD-10-CM | POA: Diagnosis not present

## 2024-05-26 NOTE — Progress Notes (Signed)
Patient came in for labs. 

## 2024-05-29 ENCOUNTER — Encounter: Payer: Self-pay | Admitting: Family

## 2024-05-29 LAB — QUANTIFERON-TB GOLD PLUS
QuantiFERON Mitogen Value: >10 [IU]/mL
QuantiFERON Nil Value: 0.07 [IU]/mL
QuantiFERON TB1 Ag Value: 0.08 [IU]/mL
QuantiFERON TB2 Ag Value: 0.07 [IU]/mL
QuantiFERON-TB Gold Plus: NEGATIVE

## 2024-05-30 NOTE — Telephone Encounter (Signed)
 I have attempted without success to contact this patient by phone to return their call /message. And I left a message on answering machine.

## 2024-06-02 ENCOUNTER — Ambulatory Visit: Payer: Self-pay | Admitting: Family

## 2024-06-02 NOTE — Telephone Encounter (Signed)
 Patient has been  called and she is going to get with her  employer to figure out exactly what type of form/letter they may need for employment.  Patient will reach back out to office  if need  .

## 2024-06-02 NOTE — Telephone Encounter (Signed)
 Schedule appointment for completion of any forms from employer and will review at that time.

## 2024-06-04 NOTE — Telephone Encounter (Signed)
 Provider has papers in her folder. Awaiting signature

## 2024-06-04 NOTE — Telephone Encounter (Signed)
 Please provide me with forms for review and completion if appropriate. Thank you.

## 2024-06-05 ENCOUNTER — Telehealth: Payer: Self-pay | Admitting: Family

## 2024-06-05 ENCOUNTER — Telehealth: Payer: Self-pay | Admitting: Emergency Medicine

## 2024-06-05 NOTE — Telephone Encounter (Signed)
 Copied from CRM [id number]. Topic: General - Other >> Jun 05, 2024 12:36 PM Carlatta H wrote: Reason for CRM: Nurse Gustav please return the patients call about paperwork

## 2024-06-05 NOTE — Telephone Encounter (Signed)
 Patient is aware of paper been sign by provider and the

## 2024-06-05 NOTE — Telephone Encounter (Signed)
 Patient had questions about things on her employment form that was not filled out.  I made her aware that she need a hep B vaccine to get one parted check off and that we do not do testing here to see if she is color blind

## 2024-06-16 NOTE — Telephone Encounter (Signed)
 Paper signed 06/05/2024

## 2024-06-26 ENCOUNTER — Ambulatory Visit: Payer: Self-pay

## 2024-10-13 ENCOUNTER — Encounter: Payer: Self-pay | Admitting: Radiology

## 2024-10-20 ENCOUNTER — Ambulatory Visit: Admitting: Cardiology

## 2024-10-21 ENCOUNTER — Other Ambulatory Visit: Payer: Self-pay | Admitting: Family

## 2024-10-21 DIAGNOSIS — K219 Gastro-esophageal reflux disease without esophagitis: Secondary | ICD-10-CM

## 2024-10-21 NOTE — Telephone Encounter (Signed)
 Complete

## 2024-10-22 ENCOUNTER — Ambulatory Visit (HOSPITAL_COMMUNITY): Admitting: Physician Assistant

## 2024-10-22 ENCOUNTER — Encounter (HOSPITAL_COMMUNITY): Payer: Self-pay

## 2024-10-26 ENCOUNTER — Emergency Department (HOSPITAL_COMMUNITY)
Admission: EM | Admit: 2024-10-26 | Discharge: 2024-10-26 | Discharge status: Against Medical Advice | Attending: Emergency Medicine | Admitting: Emergency Medicine

## 2024-10-26 DIAGNOSIS — W228XXA Striking against or struck by other objects, initial encounter: Secondary | ICD-10-CM | POA: Insufficient documentation

## 2024-10-26 DIAGNOSIS — S99921A Unspecified injury of right foot, initial encounter: Secondary | ICD-10-CM | POA: Insufficient documentation

## 2024-10-26 DIAGNOSIS — Y9301 Activity, walking, marching and hiking: Secondary | ICD-10-CM | POA: Diagnosis not present

## 2024-10-26 DIAGNOSIS — Z5321 Procedure and treatment not carried out due to patient leaving prior to being seen by health care provider: Secondary | ICD-10-CM | POA: Insufficient documentation

## 2024-10-26 NOTE — ED Triage Notes (Addendum)
 Patient here after she hit her R foot on a toy car while walking and states her pinky toe is out of place.

## 2024-10-26 NOTE — ED Notes (Signed)
 Pt incredibly disrespectful to ED staff. Pt left AMA due to not wanting to wait in the lobby.

## 2024-10-28 ENCOUNTER — Ambulatory Visit: Payer: Self-pay

## 2024-10-28 NOTE — Telephone Encounter (Signed)
 FYI Only or Action Required?: FYI only for provider: ED advised.  Patient was last seen in primary care on 03/12/2024 by Jaycee Greig PARAS, NP.  Called Nurse Triage reporting Hypertension.  Symptoms began today.  Interventions attempted: Nothing.  Symptoms are: gradually worsening.  Triage Disposition: Go to ED Now (Notify PCP)  Patient/caregiver understands and will follow disposition?: Yes, will follow disposition  Copied from CRM [id number]. Topic: Clinical - Red Word Triage >> Oct 28, 2024  4:17 PM Delon DASEN wrote: Red Word that prompted transfer to Nurse Triage: BP 162/105, headache, slight dizziness Reason for Disposition  [1] Systolic BP >= 160 OR Diastolic >= 100 AND [2] cardiac (e.g., breathing difficulty, chest pain) or neurologic symptoms (e.g., new-onset blurred or double vision, unsteady gait)  Answer Assessment - Initial Assessment Questions 1. BLOOD PRESSURE: What is your blood pressure? Did you take at least two measurements 5 minutes apart?     160/105, 156/101 2. ONSET: When did you take your blood pressure?     ongoing 3. HOW: How did you take your blood pressure? (e.g., automatic home BP monitor, visiting nurse)     Auto wrist  4. HISTORY: Do you have a history of high blood pressure?     Does not take medication, no rx 6. OTHER SYMPTOMS: Do you have any symptoms? (e.g., blurred vision, chest pain, difficulty breathing, headache, weakness)     Pt states I'm not dizzy but meh, slight headache also admits to twinges of arrhythmias  Pt states she is calling to make appt with Rosina, pt states that her BP has been more elevated more recently.  Protocols used: Blood Pressure - High-A-AH

## 2024-10-28 NOTE — Telephone Encounter (Signed)
 Patient in ED now.

## 2024-10-29 NOTE — Telephone Encounter (Signed)
 Patient went to ED

## 2024-10-29 NOTE — Telephone Encounter (Signed)
 Report to Emergency Department/Urgent Care/call 911 for immediate medical evaluation. Follow-up with Primary Care.

## 2024-11-11 ENCOUNTER — Ambulatory Visit
Admission: EM | Admit: 2024-11-11 | Discharge: 2024-11-11 | Disposition: A | Discharge status: Home / Self Care | Payer: Self-pay

## 2024-11-11 ENCOUNTER — Encounter: Payer: Self-pay | Admitting: Emergency Medicine

## 2024-11-11 ENCOUNTER — Other Ambulatory Visit: Payer: Self-pay

## 2024-11-11 DIAGNOSIS — J101 Influenza due to other identified influenza virus with other respiratory manifestations: Secondary | ICD-10-CM

## 2024-11-11 LAB — POC COVID19/FLU A&B COMBO
Covid Antigen, POC: NEGATIVE
Influenza A Antigen, POC: POSITIVE — AB
Influenza B Antigen, POC: NEGATIVE

## 2024-11-11 MED ORDER — ACETAMINOPHEN 325 MG PO TABS
650.0000 mg | ORAL_TABLET | Freq: Once | ORAL | Status: AC
  Administered 2024-11-11: 650 mg via ORAL

## 2024-11-11 MED ORDER — OSELTAMIVIR PHOSPHATE 75 MG PO CAPS: 75.0000 mg | ORAL_CAPSULE | Freq: Two times a day (BID) | ORAL | 0 refills | Status: DC

## 2024-11-11 MED ORDER — ALBUTEROL SULFATE HFA 108 (90 BASE) MCG/ACT IN AERS: 1.0000 | INHALATION_SPRAY | Freq: Four times a day (QID) | RESPIRATORY_TRACT | 0 refills | Status: DC | PRN

## 2024-11-11 MED ORDER — BENZONATATE 100 MG PO CAPS: 100.0000 mg | ORAL_CAPSULE | Freq: Three times a day (TID) | ORAL | 0 refills | Status: DC | PRN

## 2024-11-11 NOTE — ED Provider Notes (Signed)
 [facility]    CSN: [id number] Arrival date & time: 11/11/24  0804      History   Chief Complaint Chief Complaint  Patient presents with   Cough    HPI Melanie Vincent is a 48 y.o. female.   Patient presents with 2 to 3-day history of cough, nasal congestion, generalized bodyaches, fever.  Tmax at home was 100.  Reports several known sick contacts.  Reports cough is productive with bloody sputum at times.  Also reports occasional shortness of breath.  Patient denies history of asthma or COPD.  Patient's blood pressure is elevated but she reports that she has not taken her prescribed blood pressure medication in a few days.    Cough   Past Medical History:  Diagnosis Date   Anxiety    Breast lump    Mammogram (last 2013)   DDD (degenerative disc disease), lumbar    GERD (gastroesophageal reflux disease)    Herpes simplex labialis    PAF (paroxysmal atrial fibrillation) (HCC)     Patient Active Problem List   Diagnosis Date Noted   Bilateral carpal tunnel syndrome 02/28/2023   Paresthesia 08/21/2022   Bacterial vaginitis 05/26/2021   Lumbar degenerative disc disease 05/25/2021   Laryngopharyngeal reflux 08/10/2020   Educated about COVID-19 virus infection 06/13/2020   Precordial chest pain 06/13/2020   Sleep apnea suspected 10/06/2015   Anxiety reaction 09/15/2015   PAF (paroxysmal atrial fibrillation) (HCC) 11/25/2014   Herpes labialis 07/29/2013   Well woman exam (no gynecological exam) 05/02/2013   GERD (gastroesophageal reflux disease) 11/13/2012   Overweight 06/14/2012    Past Surgical History:  Procedure Laterality Date   CESAREAN SECTION     TUBAL LIGATION     2008    OB History     Gravida  6   Para  4   Term  4   Preterm      AB  2   Living  5      SAB      IAB  2   Ectopic      Multiple  1   Live Births  5        Obstetric Comments  Twins c-section in 2008          Home Medications    Prior to  Admission medications   Medication Sig Start Date End Date Taking? Authorizing Provider  albuterol (VENTOLIN HFA) 108 (90 Base) MCG/ACT inhaler Inhale 1-2 puffs into the lungs every 6 (six) hours as needed for wheezing or shortness of breath. 11/11/24  Yes Kortlynn Poust, Darryle E, FNP  amoxicillin  (AMOXIL ) 500 MG capsule Take 500 mg by mouth 3 (three) times daily. 06/04/24  Yes [provider]  benzonatate (TESSALON) 100 MG capsule Take 1 capsule (100 mg total) by mouth every 8 (eight) hours as needed for cough. 11/11/24  Yes Imoni Kohen, Darryle E, FNP  hydrochlorothiazide (HYDRODIURIL) 25 MG tablet Take 25 mg by mouth daily. 10/28/24 01/26/25 Yes [provider]  HYDROcodone -acetaminophen  (NORCO/VICODIN) 5-325 MG tablet Take 1-2 tablets by mouth every 4 (four) hours as needed. 10/26/24  Yes [provider]  meloxicam  (MOBIC ) 15 MG tablet Take 1 tablet (15 mg total) by mouth daily. Patient not taking: Reported on 05/08/2024 04/11/24 04/11/25  Williams, Megan E, NP  methocarbamol  (ROBAXIN ) 500 MG tablet Take 1 tablet (500 mg total) by mouth 3 (three) times daily. Patient not taking: Reported on 05/08/2024 04/11/24   Williams, Megan E, NP  metoprolol  succinate (  TOPROL -XL) 50 MG 24 hr tablet Take 50 mg by mouth daily. 10/28/24 01/26/25 Yes [provider]  oseltamivir  (TAMIFLU ) 75 MG capsule Take 1 capsule (75 mg total) by mouth every 12 (twelve) hours. 11/11/24  Yes Shreshta Medley, Darryle E, FNP  atorvastatin  (LIPITOR) 20 MG tablet Take 1 tablet (20 mg total) by mouth daily. Patient not taking: Reported on 05/08/2024 03/13/24   Jaycee Greig PARAS, NP  cyclobenzaprine  (FLEXERIL ) 10 MG tablet Take 1 tablet (10 mg total) by mouth 3 (three) times daily as needed for muscle spasms. 11/30/23   Jaycee Greig PARAS, NP  diltiazem  (CARDIZEM ) 30 MG tablet Take 1 tablet every 4 hours AS NEEDED for heart rate >100 Patient not taking: Reported on 05/08/2024 01/03/23   Autry, Lauren E, PA-C  flecainide  (TAMBOCOR ) 150 MG tablet Take  1-2 tablets by mouth once for afib can only use every 4 days Patient not taking: Reported on 05/08/2024 01/03/23   Autry, Lauren E, PA-C  guaiFENesin  200 MG tablet Take 1 tablet (200 mg total) by mouth every 4 (four) hours as needed for cough or to loosen phlegm. Patient not taking: Reported on 05/08/2024 11/30/23   Jaycee Greig PARAS, NP  pantoprazole  (PROTONIX ) 40 MG tablet Take 1 tablet by mouth once daily 10/21/24   Jaycee Greig PARAS, NP  predniSONE  (DELTASONE ) 10 MG tablet Take 2 tablets (20 mg total) by mouth 2 (two) times daily with a meal. Patient not taking: Reported on 05/08/2024 03/08/24   Geroldine Berg, MD  valACYclovir  (VALTREX ) 1000 MG tablet Take 1 tablet (1,000 mg total) by mouth 2 (two) times daily as needed (for cold sores). Patient not taking: Reported on 05/08/2024 11/30/23   Jaycee Greig PARAS, NP    Family History Family History  Problem Relation Age of Onset   Diabetes Father    Hypertension Father    Stroke Father    Colon polyps Father    Breast cancer Sister    Diabetes Paternal Grandmother    Heart disease Paternal Grandmother    Diabetes Paternal Grandfather    Heart disease Paternal Grandfather    Breast cancer Maternal Aunt    Colon cancer Paternal Aunt        late 70's   Breast cancer Half-Sister 49   Esophageal cancer Neg Hx    Rectal cancer Neg Hx    Stomach cancer Neg Hx     Social History Social History   Tobacco Use   Smoking status: Former    Current packs/day: 0.00    Average packs/day: 0.3 packs/day for 5.0 years (1.3 ttl pk-yrs)    Types: Cigarettes    Start date: 06/14/2002    Quit date: 06/15/2007    Years since quitting: 17.4    Passive exposure: Past   Smokeless tobacco: Never  Vaping Use   Vaping status: Never Used  Substance Use Topics   Alcohol use: Yes    Alcohol/week: 4.0 standard drinks of alcohol    Types: 2 Glasses of wine, 2 Standard drinks or equivalent per week    Comment: Occassionally.   Drug use: Yes    Types: Marijuana     Comment: Occassionally.     Allergies   Zofran  [ondansetron  hcl]   Review of Systems Review of Systems Per HPI  Physical Exam Triage Vital Signs ED Triage Vitals  Encounter Vitals Group     BP 11/11/24 0916 (!) 179/104     Girls Systolic BP Percentile --      Girls Diastolic BP  Percentile --      Boys Systolic BP Percentile --      Boys Diastolic BP Percentile --      Pulse Rate 11/11/24 0916 93     Resp 11/11/24 0916 18     Temp 11/11/24 0916 (!) 101.4 F (38.6 C)     Temp Source 11/11/24 0916 Oral     SpO2 11/11/24 0916 95 %     Weight --      Height --      Head Circumference --      Peak Flow --      Pain Score 11/11/24 0917 10     Pain Loc --      Pain Education --      Exclude from Growth Chart --    No data found.  Updated Vital Signs BP (!) 179/104 (BP Location: Left Arm)   Pulse 93   Temp (!) 101.4 F (38.6 C) (Oral)   Resp 18   SpO2 95%   Visual Acuity Right Eye Distance:   Left Eye Distance:   Bilateral Distance:    Right Eye Near:   Left Eye Near:    Bilateral Near:     Physical Exam Constitutional:      General: She is not in acute distress.    Appearance: Normal appearance. She is ill-appearing. She is not toxic-appearing or diaphoretic.  HENT:     Head: Normocephalic and atraumatic.     Right Ear: Tympanic membrane and ear canal normal.     Left Ear: Tympanic membrane and ear canal normal.     Nose: Congestion present.     Mouth/Throat:     Mouth: Mucous membranes are moist.     Pharynx: No posterior oropharyngeal erythema.  Eyes:     Extraocular Movements: Extraocular movements intact.     Conjunctiva/sclera: Conjunctivae normal.     Pupils: Pupils are equal, round, and reactive to light.  Cardiovascular:     Rate and Rhythm: Normal rate and regular rhythm.     Pulses: Normal pulses.     Heart sounds: Normal heart sounds.  Pulmonary:     Effort: Pulmonary effort is normal. No respiratory distress.     Breath sounds: Normal  breath sounds. No stridor. No wheezing, rhonchi or rales.  Musculoskeletal:        General: Normal range of motion.     Cervical back: Normal range of motion.  Skin:    General: Skin is warm and dry.  Neurological:     General: No focal deficit present.     Mental Status: She is alert and oriented to person, place, and time. Mental status is at baseline.  Psychiatric:        Mood and Affect: Mood normal.        Behavior: Behavior normal.      UC Treatments / Results  Labs (all labs ordered are listed, but only abnormal results are displayed) Labs Reviewed  POC COVID19/FLU A&B COMBO - Abnormal; Notable for the following components:      Result Value   Influenza A Antigen, POC Positive (*)    All other components within normal limits    EKG   Radiology No results found.  Procedures Procedures (including critical care time)  Medications Ordered in UC Medications  acetaminophen  (TYLENOL ) tablet 650 mg (650 mg Oral Given 11/11/24 0920)    Initial Impression / Assessment and Plan / UC Course  I have reviewed the triage vital signs  and the nursing notes.  Pertinent labs & imaging results that were available during my care of the patient were reviewed by me and considered in my medical decision making (see chart for details).     Patient tested positive for influenza A.  Acetaminophen  administered in urgent care for fever with minimal improvement.  Discussed appropriate fever monitoring and management at home.  Also recommended adequate fluids, rest, supportive care.  Educated patient on over-the-counter cold and flu medications that will not cause an increase in blood pressure.  Will treat with Tamiflu but encouraged patient to start taking today so that it will be effective.  Given patient reports productive cough and occasional shortness of breath, recommended chest imaging but she declined this.  Will prescribe albuterol to take as needed for shortness of breath.  There are  no adventitious lung sounds on exam, oxygen is normal, and there is no tachypnea so do not think that ER evaluation is necessary.  Benzonatate prescribed to take as needed for cough as well.  Patient's blood pressure is elevated but she reports that she has not been taking her blood pressure medication so encouraged her to restart this as prescribed as soon as possible and monitor blood pressure closely at home.  Patient was given strict return and ER precautions.  Patient verbalized understanding and was agreeable with plan. Final Clinical Impressions(s) / UC Diagnoses   Final diagnoses:  Influenza A     Discharge Instructions      You tested positive for influenza.  I have prescribed Tamiflu to help treat this.  I have also prescribed albuterol inhaler to take as needed for shortness of breath as well as a cough medication to take as needed.  Please ensure you are drinking plenty of fluids and resting.  Monitor blood pressure at home and follow-up with PCP or urgent care if it remains elevated.    ED Prescriptions     Medication Sig Dispense Auth. Provider   benzonatate (TESSALON) 100 MG capsule Take 1 capsule (100 mg total) by mouth every 8 (eight) hours as needed for cough. 21 capsule Poyen, Ossun E, FNP   oseltamivir (TAMIFLU) 75 MG capsule Take 1 capsule (75 mg total) by mouth every 12 (twelve) hours. 10 capsule Santia Labate E, FNP   albuterol (VENTOLIN HFA) 108 (90 Base) MCG/ACT inhaler Inhale 1-2 puffs into the lungs every 6 (six) hours as needed for wheezing or shortness of breath. 1 each Hazen Darryle BRAVO, [place]      PDMP not reviewed this encounter.   Hazen Darryle BRAVO, [place] 11/11/24 1051

## 2024-11-11 NOTE — Discharge Instructions (Signed)
 You tested positive for influenza.  I have prescribed Tamiflu to help treat this.  I have also prescribed albuterol inhaler to take as needed for shortness of breath as well as a cough medication to take as needed.  Please ensure you are drinking plenty of fluids and resting.  Monitor blood pressure at home and follow-up with PCP or urgent care if it remains elevated.

## 2024-11-11 NOTE — ED Triage Notes (Signed)
 Pt here for cough, pain with cough, fever and body aches x 3 days

## 2024-11-13 ENCOUNTER — Ambulatory Visit: Payer: Self-pay

## 2024-11-13 ENCOUNTER — Other Ambulatory Visit: Payer: Self-pay

## 2024-11-13 ENCOUNTER — Ambulatory Visit: Payer: Self-pay | Attending: Internal Medicine | Admitting: Internal Medicine

## 2024-11-13 ENCOUNTER — Encounter: Payer: Self-pay | Admitting: Internal Medicine

## 2024-11-13 VITALS — BP 118/83 | HR 93 | Temp 98.6°F | Ht 66.0 in | Wt 203.0 lb

## 2024-11-13 DIAGNOSIS — J111 Influenza due to unidentified influenza virus with other respiratory manifestations: Secondary | ICD-10-CM

## 2024-11-13 DIAGNOSIS — J029 Acute pharyngitis, unspecified: Secondary | ICD-10-CM

## 2024-11-13 LAB — POCT RAPID STREP A (OFFICE): Rapid Strep A Screen: NEGATIVE

## 2024-11-13 MED ORDER — OSELTAMIVIR PHOSPHATE 75 MG PO CAPS
75.0000 mg | ORAL_CAPSULE | Freq: Two times a day (BID) | ORAL | 0 refills | Status: AC
  Filled 2024-11-13: qty 10, 5d supply, fill #0

## 2024-11-13 MED ORDER — ALBUTEROL SULFATE HFA 108 (90 BASE) MCG/ACT IN AERS
1.0000 | INHALATION_SPRAY | Freq: Four times a day (QID) | RESPIRATORY_TRACT | 0 refills | Status: AC | PRN
  Filled 2024-11-13: qty 6.7, 25d supply, fill #0

## 2024-11-13 MED ORDER — BENZONATATE 100 MG PO CAPS
100.0000 mg | ORAL_CAPSULE | Freq: Three times a day (TID) | ORAL | 0 refills | Status: AC | PRN
  Filled 2024-11-13: qty 21, 7d supply, fill #0

## 2024-11-13 NOTE — Patient Instructions (Signed)
  VISIT SUMMARY: Today, you were seen for persistent flu symptoms, including a wheezy cough, muscle soreness, and weakness. You were previously diagnosed with the flu but had difficulty obtaining your prescribed medications. We also discussed your sore throat and hypertension management.  YOUR PLAN: -INFLUENZA WITH UPPER RESPIRATORY SYMPTOMS: You have the flu, which is a viral infection that affects your respiratory system. Your fever has decreased, but you still have symptoms like weakness, muscle soreness, and a wheezy cough. We have provided you with Tamiflu , an albuterol  inhaler, and cough tablets from our in-house pharmacy. Continue taking Tylenol  for body aches and fever as needed, drink plenty of fluids, and get lots of rest. Your work leave has been extended until Tuesday of next week.  -ACUTE PHARYNGITIS: You have a sore throat, which is an inflammation of the throat. Your throat is slightly red, but the strep test was negative. We recommend gargling with warm salt water to help alleviate the pain.  -HYPERTENSION: You have high blood pressure, which means the force of the blood against your artery walls is too high. You are currently taking hydrochlorothiazide and metoprolol . Avoid NSAIDs like Advil , and follow up with your primary care provider for ongoing management of your blood pressure.  INSTRUCTIONS: Please follow up with your primary care provider for hypertension management. Your work leave has been extended until Tuesday of next week. Continue taking Tylenol  for body aches and fever as needed, drink plenty of fluids, and get lots of rest.                      Contains text generated by [employer].                                 Contains text generated by [employer].

## 2024-11-13 NOTE — Progress Notes (Signed)
 Patient ID: Melanie Vincent, female    DOB: 07/20/1976  MRN: [id number]  CC: Influenza (SOB, wheezing, body aches, trouble walking, chest pains X5 days)   Subjective: Melanie Vincent is a 48 y.o. female who presents for UC visit. PCP is NP Jaycee. Her concerns today include:  Pt with hx of PAF, OSA, GERD, HL, HTN  Discussed the use of AI scribe software for clinical note transcription with the patient, who gave verbal consent to proceed.  History of Present Illness Melanie Vincent is a 48 year old female with recently diagnosed hypertension who presents with persistent flu symptoms.  She was seen at urgent care two days ago with cough, nasal congestion, body aches, and fever, and was diagnosed with the flu. A rapid flu test was positive. Tamiflu was prescribed but not started due to pharmacy issues and cost; she is uninsured. She was also prescribed an inhaler and cough tablets, which she did not obtain for same reason.  Her symptoms have persisted, and she feels extremely weak, with muscle soreness making it difficult to walk. She has been mostly bedridden since Monday, only getting up to visit urgent care. She describes a 'wheezy cough' with some clear or white phlegm, constant sweating, and feeling cold despite the sweating. 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  She reports a fever that peaked at 102F on Tuesday (2 days ago), which has since decreased to 99.61F as of yesterday afternoon. She has been using Tylenol  for fever and body aches, avoiding Advil  due to its effect on her blood pressure. The last dose of Tylenol  was taken 12 hours ago.  She experiences rapid breathing and fatigue. She reports a sore throat on the right side, with pain radiating to her ear, which has slightly improved.    Patient Active Problem List   Diagnosis Date Noted   Bilateral carpal tunnel syndrome 02/28/2023   Paresthesia  08/21/2022   Bacterial vaginitis 05/26/2021   Lumbar degenerative disc disease 05/25/2021   Laryngopharyngeal reflux 08/10/2020   Educated about COVID-19 virus infection 06/13/2020   Precordial chest pain 06/13/2020   Sleep apnea suspected 10/06/2015   Anxiety reaction 09/15/2015   PAF (paroxysmal atrial fibrillation) (HCC) 11/25/2014   Herpes labialis 07/29/2013   Well woman exam (no gynecological exam) 05/02/2013   GERD (gastroesophageal reflux disease) 11/13/2012   Overweight 06/14/2012     Current Outpatient Medications on File Prior to Visit  Medication Sig Dispense Refill   hydrochlorothiazide (HYDRODIURIL) 25 MG tablet Take 25 mg by mouth daily.     meloxicam  (MOBIC ) 15 MG tablet Take 1 tablet (15 mg total) by mouth daily. (Patient not taking: Reported on 11/13/2024) 30 tablet 0   methocarbamol  (ROBAXIN ) 500 MG tablet Take 1 tablet (500 mg total) by mouth 3 (three) times daily. (Patient not taking: Reported on 11/13/2024) 90 tablet 0   metoprolol  succinate (TOPROL -XL) 50 MG 24 hr tablet Take 50 mg by mouth daily.     atorvastatin  (LIPITOR) 20 MG tablet Take 1 tablet (20 mg total) by mouth daily. (Patient not taking: Reported on 11/13/2024) 90 tablet 0   cyclobenzaprine  (FLEXERIL ) 10 MG tablet Take 1 tablet (10 mg total) by mouth 3 (three) times daily as needed for muscle spasms. (Patient not taking: Reported on 11/13/2024) 90 tablet 0   diltiazem  (CARDIZEM ) 30 MG tablet Take 1 tablet every 4 hours AS NEEDED for heart rate >  100 (Patient not taking: Reported on 11/13/2024) 30 tablet 3   flecainide  (TAMBOCOR ) 150 MG tablet Take 1-2 tablets by mouth once for afib can only use every 4 days (Patient not taking: Reported on 11/13/2024) 6 tablet 1   guaiFENesin  200 MG tablet Take 1 tablet (200 mg total) by mouth every 4 (four) hours as needed for cough or to loosen phlegm. (Patient not taking: Reported on 11/13/2024) 30 tablet 1   HYDROcodone -acetaminophen  (NORCO/VICODIN) 5-325 MG tablet Take 1-2  tablets by mouth every 4 (four) hours as needed. (Patient not taking: Reported on 11/13/2024)     pantoprazole  (PROTONIX ) 40 MG tablet Take 1 tablet by mouth once daily (Patient not taking: Reported on 11/13/2024) 90 tablet 0   valACYclovir  (VALTREX ) 1000 MG tablet Take 1 tablet (1,000 mg total) by mouth 2 (two) times daily as needed (for cold sores). (Patient not taking: Reported on 11/13/2024) 30 tablet 1   No current facility-administered medications on file prior to visit.    Allergies  Allergen Reactions   Zofran  [Ondansetron  Hcl] Itching    Social History   Socioeconomic History   Marital status: Single    Spouse name: Not on file   Number of children: 4   Years of education: Not on file   Highest education level: Not on file  Occupational History   Occupation: Caregiver  Tobacco Use   Smoking status: Former    Current packs/day: 0.00    Average packs/day: 0.3 packs/day for 5.0 years (1.3 ttl pk-yrs)    Types: Cigarettes    Start date: 06/14/2002    Quit date: 06/15/2007    Years since quitting: 17.4    Passive exposure: Past   Smokeless tobacco: Never  Vaping Use   Vaping status: Never Used  Substance and Sexual Activity   Alcohol use: Yes    Alcohol/week: 4.0 standard drinks of alcohol    Types: 2 Glasses of wine, 2 Standard drinks or equivalent per week    Comment: Occassionally.   Drug use: Yes    Types: Marijuana    Comment: Occassionally.   Sexual activity: Yes    Birth control/protection: Surgical, Condom  Other Topics Concern   Not on file  Social History Narrative   Lives in [place] with 3 children and has two others.     Single.   Works as a clinical biochemist (travel)   Social Drivers of Corporate Investment Banker Strain: Not on [employer] Insecurity: Not on file  Transportation Needs: Not on file  Physical Activity: Not on file  Stress: Not on file  Social Connections: Unknown (04/27/2022)   [facility]   Social Network     Social Network: Not on file  Intimate Partner Violence: Not At Risk (10/26/2024)   Received from [facility]   HITS    Over the last 12 months how often did your partner physically hurt you?: Never    Over the last 12 months how often did your partner insult you or talk down to you?: Never    Over the last 12 months how often did your partner threaten you with physical harm?: Never    Over the last 12 months how often did your partner scream or curse at you?: Never    Family History  Problem Relation Age of Onset   Diabetes Father    Hypertension Father    Stroke Father    Colon polyps Father    Breast cancer Sister  Diabetes Paternal Grandmother    Heart disease Paternal Grandmother    Diabetes Paternal Grandfather    Heart disease Paternal Grandfather    Breast cancer Maternal Aunt    Colon cancer Paternal Aunt        late 70's   Breast cancer Half-Sister 40   Esophageal cancer Neg Hx    Rectal cancer Neg Hx    Stomach cancer Neg Hx     Past Surgical History:  Procedure Laterality Date   CESAREAN SECTION     TUBAL LIGATION     2008    ROS: Review of Systems Negative except as stated above  PHYSICAL EXAM: BP 118/83 (BP Location: Left Arm, Patient Position: Sitting, Cuff Size: Normal)   Pulse 93   Temp 98.6 F (37 C) (Oral)   Ht 5' 6 (1.676 m)   Wt 203 lb (92.1 kg)   SpO2 100%   BMI 32.77 kg/m   Physical Exam  General appearance - alert, middle-age African-American female and in no distress.  She looks unwell but is nontoxic looking and breathing is not labored Mental status - normal mood, behavior, speech, dress, motor activity, and thought processes Mouth -throat is mildly erythematous bilaterally without exudates Neck - supple, no significant adenopathy Chest -breath sounds are clear with a few scattered wheezes heard at the bases.  She is not tachypneic and she is able to speak in full sentences. Heart - normal rate, regular rhythm, normal S1, S2, no  murmurs, rubs, clicks or gallops  Results for orders placed or performed in visit on 11/13/24  Rapid Strep A   Collection Time: 11/13/24  2:37 PM  Result Value Ref Range   Rapid Strep A Screen Negative Negative       Latest Ref Rng & Units 12/14/2023    9:40 AM 07/05/2023   12:21 PM 02/15/2023   11:26 PM  CMP  Glucose 70 - 99 mg/dL 87  893  92   BUN 6 - 24 mg/dL 17  11  13    Creatinine 0.57 - 1.00 mg/dL 9.21  9.34  9.31   Sodium 134 - 144 mmol/L 139  141  141   Potassium 3.5 - 5.2 mmol/L 4.8  3.6  4.1   Chloride 96 - 106 mmol/L 103  106  107   CO2 20 - 29 mmol/L 23  21  26    Calcium  8.7 - 10.2 mg/dL 9.8  9.5  9.9   Total Protein 6.0 - 8.5 g/dL 7.0  6.6    Total Bilirubin 0.0 - 1.2 mg/dL 0.3  0.7    Alkaline Phos 44 - 121 IU/L 71  52    AST 0 - 40 IU/L 17  22    ALT 0 - 32 IU/L 21  25     Lipid Panel     Component Value Date/Time   CHOL 192 03/12/2024 0901   TRIG 55 03/12/2024 0901   HDL 57 03/12/2024 0901   CHOLHDL 3.4 03/12/2024 0901   CHOLHDL 3.7 Ratio 06/01/2009 2050   VLDL 12 06/01/2009 2050   LDLCALC 125 (H) 03/12/2024 0901    CBC    Component Value Date/Time   WBC 6.2 12/14/2023 0940   WBC 6.2 07/05/2023 1221   RBC 4.30 12/14/2023 0940   RBC 3.93 07/05/2023 1221   HGB 12.1 12/14/2023 0940   HCT 39.3 12/14/2023 0940   PLT 394 12/14/2023 0940   MCV 91 12/14/2023 0940   MCH 28.1 12/14/2023 0940  MCH 29.5 07/05/2023 1221   MCHC 30.8 (L) 12/14/2023 0940   MCHC 32.3 07/05/2023 1221   RDW 12.7 12/14/2023 0940   LYMPHSABS 2.0 07/05/2023 1221   LYMPHSABS 2.8 08/24/2020 1202   MONOABS 0.5 07/05/2023 1221   EOSABS 0.1 07/05/2023 1221   EOSABS 0.1 08/24/2020 1202   BASOSABS 0.0 07/05/2023 1221   BASOSABS 0.0 08/24/2020 1202    ASSESSMENT AND PLAN: 1. Influenzal acute upper respiratory infection (Primary) I have resent the prescriptions to our pharmacy for the Tamiflu , albuterol  and Tessalon  Perles which should be more affordable for her.  Advised to continue  using Tylenol  as needed for body aches and any fever. Work excuse extended until the ninth. - albuterol  (VENTOLIN  HFA) 108 (90 Base) MCG/ACT inhaler; Inhale 1-2 puffs into the lungs every 6 (six) hours as needed for wheezing or shortness of breath.  Dispense: 6.7 g; Refill: 0 - benzonatate  (TESSALON ) 100 MG capsule; Take 1 capsule (100 mg total) by mouth every 8 (eight) hours as needed for cough.  Dispense: 21 capsule; Refill: 0 - oseltamivir  (TAMIFLU ) 75 MG capsule; Take 1 capsule (75 mg total) by mouth every 12 (twelve) hours.  Dispense: 10 capsule; Refill: 0  2. Pharyngitis, unspecified etiology Strep test was negative.  Recommend gargling with warm water and salt to soothe the throat. - Rapid Strep A   Patient was given the opportunity to ask questions.  Patient verbalized understanding of the plan and was able to repeat key elements of the plan.   This documentation was completed using Paediatric nurse.  Any transcriptional errors are unintentional.  Orders Placed This Encounter  Procedures   Rapid Strep A     Requested Prescriptions   Signed Prescriptions Disp Refills   albuterol  (VENTOLIN  HFA) 108 (90 Base) MCG/ACT inhaler 6.7 g 0    Sig: Inhale 1-2 puffs into the lungs every 6 (six) hours as needed for wheezing or shortness of breath.   benzonatate  (TESSALON ) 100 MG capsule 21 capsule 0    Sig: Take 1 capsule (100 mg total) by mouth every 8 (eight) hours as needed for cough.   oseltamivir  (TAMIFLU ) 75 MG capsule 10 capsule 0    Sig: Take 1 capsule (75 mg total) by mouth every 12 (twelve) hours.    Return if symptoms worsen or fail to improve, for Follow with your PCP [facility] for blood pressure.  Barnie Louder, MD, FACP

## 2024-11-13 NOTE — Telephone Encounter (Signed)
 Copied from CRM [id number]. Topic: Clinical - Red Word Triage >> Nov 13, 2024  8:33 AM Melanie Vincent wrote: Red Word that prompted transfer to Nurse Triage:  Pt states was seen at urgent care on Tuesday, 11/11/24 was diagnosed with flu, was prescribed Tamiflu, but did not pick up medication due to being unable to afford med, and also reports she had a previous adverse reaction to medication.   Pt reports she feels horrible, having pain and achy all over her body, and unable to walk due to joint and bone pain.  Pt is requesting an appt for evaluation. Reason for Disposition . MILD difficulty breathing (e.g., minimal/no SOB at rest, SOB with walking, pulse < 100)  Answer Assessment - Initial Assessment Questions 1. DIAGNOSIS CONFIRMATION: When was the influenza diagnosed? By whom? Did you get a test for it?     12/2 dx at [facility] 2. INFLUENZA MEDICINES: Were you prescribed any medicines for the influenza?  (e.g., zanamivir [Relenza], oseltamivir [Tamiflu]).      Denies, did not pick up because it went to a different pharm 3. SYMPTOMS: What is your main symptom or concern? (e.g., cough, fever, shortness of breath, muscle aches)     weakness 5. COUGH: Do you have a cough? If Yes, ask: How bad is the cough?       Has cough, states sputum is white 6. FEVER: Do you have a fever? If Yes, ask: What is your temperature, how was it measured, and when did it start?     Denies, states 99 this morning 7. BREATHING DIFFICULTY: Are you having any difficulty breathing? (e.g., normal; shortness of breath, wheezing, unable to speak)      Wheezing,  9. HIGH RISK FOR COMPLICATIONS: Do you have any chronic medical problems? (e.g., asthma, heart or lung disease, obesity, weak immune system)     HTN 11. O2 SATURATION MONITOR:  Do you use an oxygen saturation monitor (pulse oximeter) at home? If Yes, ask What is your reading (oxygen level) today? What is your usual oxygen saturation reading? (e.g.,  95%)       96%, HR 91  Pt states that she can'Vincent take advil  d/Vincent HTN. States that tylenol  does not help. Pt states I will not go to the emergency room, I find it uncomfortable, I need to be seen in the office. Pt requesting appt, scheduled at regional location as pcp office did not have any appt until Jan.  Protocols used: Influenza (Flu) Follow-up Call-A-AH

## 2024-11-13 NOTE — Telephone Encounter (Signed)
 FYI Only or Action Required?: FYI only for provider: appointment scheduled on today.  Patient was last seen in primary care on 03/12/2024 by Jaycee Greig PARAS, NP.  Called Nurse Triage reporting Influenza.  Symptoms began several days ago.  Interventions attempted: Nothing.  Symptoms are: unchanged.  Triage Disposition: See HCP Within 4 Hours (Or PCP Triage)  Patient/caregiver understands and will follow disposition?: Yes, will follow disposition    Pt states was seen at urgent care on Tuesday, 11/11/24 was diagnosed with flu, was prescribed Tamiflu, but did not pick up medication due to being unable to afford med, and also reports she had a previous adverse reaction to medication.        Pt reports she feels horrible, having pain and achy all over her body, and unable to walk due to joint and bone pain.        Pt is requesting an appt for evaluation.

## 2024-11-14 NOTE — Telephone Encounter (Signed)
 Report to Emergency Department/Urgent Care/call 911 for immediate medical evaluation. Follow-up with Primary Care.

## 2024-11-14 NOTE — Telephone Encounter (Signed)
 I called patient with recommendations from pcp and she stated she has already been seen

## 2024-11-17 ENCOUNTER — Other Ambulatory Visit: Payer: Self-pay

## 2024-12-08 ENCOUNTER — Telehealth: Payer: Self-pay

## 2024-12-08 NOTE — Telephone Encounter (Signed)
 Attempted to reach patient concerning colonoscopy recall; unable to speak with patient;  phone just rings without answer or voicemail; will attempt to reach patient at a later date/time;
